# Patient Record
Sex: Female | Born: 1945 | Race: White | Hispanic: No | Smoking: Former smoker
Health system: Southern US, Community
[De-identification: ages and names within clinical notes are randomized; demographics above are authoritative.]

## PROBLEM LIST (undated history)

## (undated) DIAGNOSIS — G8929 Other chronic pain: Secondary | ICD-10-CM

## (undated) DIAGNOSIS — E039 Hypothyroidism, unspecified: Secondary | ICD-10-CM

## (undated) DIAGNOSIS — R1013 Epigastric pain: Secondary | ICD-10-CM

## (undated) DIAGNOSIS — K5790 Diverticulosis of intestine, part unspecified, without perforation or abscess without bleeding: Secondary | ICD-10-CM

## (undated) DIAGNOSIS — M545 Low back pain, unspecified: Secondary | ICD-10-CM

## (undated) DIAGNOSIS — G4700 Insomnia, unspecified: Secondary | ICD-10-CM

## (undated) DIAGNOSIS — D126 Benign neoplasm of colon, unspecified: Secondary | ICD-10-CM

## (undated) DIAGNOSIS — K219 Gastro-esophageal reflux disease without esophagitis: Secondary | ICD-10-CM

## (undated) DIAGNOSIS — K449 Diaphragmatic hernia without obstruction or gangrene: Secondary | ICD-10-CM

## (undated) DIAGNOSIS — G629 Polyneuropathy, unspecified: Secondary | ICD-10-CM

## (undated) DIAGNOSIS — K269 Duodenal ulcer, unspecified as acute or chronic, without hemorrhage or perforation: Secondary | ICD-10-CM

## (undated) HISTORY — DX: Hypothyroidism, unspecified: E03.9

## (undated) HISTORY — DX: Insomnia, unspecified: G47.00

## (undated) HISTORY — DX: Polyneuropathy, unspecified: G62.9

## (undated) HISTORY — DX: Low back pain: M54.5

## (undated) HISTORY — PX: ABDOMINAL HYSTERECTOMY: SHX81

## (undated) HISTORY — PX: CHOLECYSTECTOMY: SHX55

## (undated) HISTORY — PX: BACK SURGERY: SHX140

## (undated) HISTORY — DX: Benign neoplasm of colon, unspecified: D12.6

## (undated) HISTORY — DX: Other chronic pain: G89.29

## (undated) HISTORY — PX: DILATION AND CURETTAGE OF UTERUS: SHX78

## (undated) HISTORY — DX: Duodenal ulcer, unspecified as acute or chronic, without hemorrhage or perforation: K26.9

## (undated) HISTORY — DX: Gastro-esophageal reflux disease without esophagitis: K21.9

## (undated) HISTORY — DX: Diverticulosis of intestine, part unspecified, without perforation or abscess without bleeding: K57.90

## (undated) HISTORY — DX: Diaphragmatic hernia without obstruction or gangrene: K44.9

## (undated) HISTORY — PX: BLEPHAROPLASTY: SUR158

## (undated) HISTORY — DX: Low back pain, unspecified: M54.50

## (undated) HISTORY — DX: Epigastric pain: R10.13

---

## 1998-08-18 ENCOUNTER — Ambulatory Visit (HOSPITAL_COMMUNITY): Admission: RE | Admit: 1998-08-18 | Discharge: 1998-08-18 | Payer: Self-pay | Admitting: Gastroenterology

## 2000-01-01 ENCOUNTER — Other Ambulatory Visit: Admission: RE | Admit: 2000-01-01 | Discharge: 2000-01-01 | Payer: Self-pay | Admitting: Gynecology

## 2000-10-31 ENCOUNTER — Other Ambulatory Visit: Admission: RE | Admit: 2000-10-31 | Discharge: 2000-10-31 | Payer: Self-pay | Admitting: Gynecology

## 2001-11-06 ENCOUNTER — Other Ambulatory Visit: Admission: RE | Admit: 2001-11-06 | Discharge: 2001-11-06 | Payer: Self-pay | Admitting: Gynecology

## 2001-11-10 ENCOUNTER — Encounter: Payer: Self-pay | Admitting: Orthopaedic Surgery

## 2001-11-10 ENCOUNTER — Ambulatory Visit (HOSPITAL_COMMUNITY): Admission: RE | Admit: 2001-11-10 | Discharge: 2001-11-10 | Payer: Self-pay | Admitting: Urology

## 2001-11-13 ENCOUNTER — Encounter: Payer: Self-pay | Admitting: Orthopaedic Surgery

## 2001-11-13 ENCOUNTER — Encounter: Admission: RE | Admit: 2001-11-13 | Discharge: 2001-11-13 | Payer: Self-pay | Admitting: Orthopaedic Surgery

## 2002-12-01 ENCOUNTER — Encounter: Admission: RE | Admit: 2002-12-01 | Discharge: 2002-12-01 | Payer: Self-pay | Admitting: Family Medicine

## 2002-12-01 ENCOUNTER — Encounter: Payer: Self-pay | Admitting: Family Medicine

## 2002-12-02 ENCOUNTER — Encounter (HOSPITAL_BASED_OUTPATIENT_CLINIC_OR_DEPARTMENT_OTHER): Payer: Self-pay | Admitting: General Surgery

## 2002-12-03 ENCOUNTER — Encounter (HOSPITAL_BASED_OUTPATIENT_CLINIC_OR_DEPARTMENT_OTHER): Payer: Self-pay | Admitting: General Surgery

## 2002-12-03 ENCOUNTER — Encounter (INDEPENDENT_AMBULATORY_CARE_PROVIDER_SITE_OTHER): Payer: Self-pay | Admitting: *Deleted

## 2002-12-03 ENCOUNTER — Ambulatory Visit (HOSPITAL_COMMUNITY): Admission: RE | Admit: 2002-12-03 | Discharge: 2002-12-05 | Payer: Self-pay | Admitting: General Surgery

## 2002-12-04 ENCOUNTER — Encounter (HOSPITAL_BASED_OUTPATIENT_CLINIC_OR_DEPARTMENT_OTHER): Payer: Self-pay | Admitting: General Surgery

## 2002-12-09 ENCOUNTER — Emergency Department (HOSPITAL_COMMUNITY): Admission: EM | Admit: 2002-12-09 | Discharge: 2002-12-09 | Payer: Self-pay | Admitting: Emergency Medicine

## 2002-12-09 ENCOUNTER — Encounter (HOSPITAL_BASED_OUTPATIENT_CLINIC_OR_DEPARTMENT_OTHER): Payer: Self-pay | Admitting: General Surgery

## 2005-03-17 ENCOUNTER — Emergency Department (HOSPITAL_COMMUNITY): Admission: EM | Admit: 2005-03-17 | Discharge: 2005-03-17 | Payer: Self-pay | Admitting: Emergency Medicine

## 2005-10-30 ENCOUNTER — Other Ambulatory Visit: Admission: RE | Admit: 2005-10-30 | Discharge: 2005-10-30 | Payer: Self-pay | Admitting: Gynecology

## 2006-05-14 HISTORY — PX: EYE SURGERY: SHX253

## 2007-01-22 ENCOUNTER — Other Ambulatory Visit: Admission: RE | Admit: 2007-01-22 | Discharge: 2007-01-22 | Payer: Self-pay | Admitting: Gynecology

## 2007-01-22 ENCOUNTER — Ambulatory Visit: Payer: Self-pay | Admitting: Internal Medicine

## 2007-02-06 ENCOUNTER — Encounter: Payer: Self-pay | Admitting: Internal Medicine

## 2007-02-06 ENCOUNTER — Ambulatory Visit: Payer: Self-pay | Admitting: Internal Medicine

## 2007-04-24 ENCOUNTER — Emergency Department (HOSPITAL_COMMUNITY): Admission: EM | Admit: 2007-04-24 | Discharge: 2007-04-25 | Payer: Self-pay | Admitting: Emergency Medicine

## 2007-05-01 ENCOUNTER — Ambulatory Visit: Payer: Self-pay | Admitting: Internal Medicine

## 2007-06-12 ENCOUNTER — Ambulatory Visit: Payer: Self-pay | Admitting: Internal Medicine

## 2008-02-28 ENCOUNTER — Encounter: Payer: Self-pay | Admitting: Family Medicine

## 2008-05-17 ENCOUNTER — Encounter: Payer: Self-pay | Admitting: Internal Medicine

## 2008-07-20 ENCOUNTER — Ambulatory Visit: Payer: Self-pay | Admitting: Family Medicine

## 2008-07-20 DIAGNOSIS — E039 Hypothyroidism, unspecified: Secondary | ICD-10-CM

## 2008-07-20 HISTORY — DX: Hypothyroidism, unspecified: E03.9

## 2008-07-22 ENCOUNTER — Encounter: Payer: Self-pay | Admitting: Family Medicine

## 2008-12-21 ENCOUNTER — Ambulatory Visit: Payer: Self-pay | Admitting: Family Medicine

## 2008-12-21 DIAGNOSIS — H60399 Other infective otitis externa, unspecified ear: Secondary | ICD-10-CM

## 2009-05-19 ENCOUNTER — Ambulatory Visit: Payer: Self-pay | Admitting: Family Medicine

## 2009-05-19 DIAGNOSIS — J029 Acute pharyngitis, unspecified: Secondary | ICD-10-CM | POA: Insufficient documentation

## 2009-05-22 ENCOUNTER — Emergency Department (HOSPITAL_BASED_OUTPATIENT_CLINIC_OR_DEPARTMENT_OTHER): Admission: EM | Admit: 2009-05-22 | Discharge: 2009-05-22 | Payer: Self-pay | Admitting: Emergency Medicine

## 2009-05-22 ENCOUNTER — Ambulatory Visit: Payer: Self-pay | Admitting: Diagnostic Radiology

## 2009-05-27 ENCOUNTER — Telehealth: Payer: Self-pay | Admitting: Family Medicine

## 2009-07-19 ENCOUNTER — Encounter: Payer: Self-pay | Admitting: Internal Medicine

## 2009-07-19 ENCOUNTER — Ambulatory Visit: Payer: Self-pay | Admitting: Gynecology

## 2009-07-19 ENCOUNTER — Other Ambulatory Visit: Admission: RE | Admit: 2009-07-19 | Discharge: 2009-07-19 | Payer: Self-pay | Admitting: Gynecology

## 2009-07-26 ENCOUNTER — Ambulatory Visit: Payer: Self-pay | Admitting: Gynecology

## 2009-07-26 ENCOUNTER — Encounter: Payer: Self-pay | Admitting: Family Medicine

## 2009-07-28 ENCOUNTER — Telehealth: Payer: Self-pay | Admitting: Family Medicine

## 2009-08-16 ENCOUNTER — Ambulatory Visit: Payer: Self-pay | Admitting: Vascular Surgery

## 2009-08-16 ENCOUNTER — Ambulatory Visit (HOSPITAL_COMMUNITY)
Admission: RE | Admit: 2009-08-16 | Discharge: 2009-08-16 | Payer: Self-pay | Admitting: Physical Medicine and Rehabilitation

## 2009-08-16 ENCOUNTER — Encounter (INDEPENDENT_AMBULATORY_CARE_PROVIDER_SITE_OTHER): Payer: Self-pay | Admitting: Physical Medicine and Rehabilitation

## 2009-08-19 ENCOUNTER — Telehealth: Payer: Self-pay | Admitting: Internal Medicine

## 2009-08-19 ENCOUNTER — Encounter: Payer: Self-pay | Admitting: Internal Medicine

## 2009-08-22 ENCOUNTER — Ambulatory Visit: Payer: Self-pay | Admitting: Family Medicine

## 2009-08-22 DIAGNOSIS — R1013 Epigastric pain: Secondary | ICD-10-CM | POA: Insufficient documentation

## 2009-08-22 HISTORY — DX: Epigastric pain: R10.13

## 2009-08-23 LAB — CONVERTED CEMR LAB
ALT: 24 units/L (ref 0–35)
AST: 23 units/L (ref 0–37)
Alkaline Phosphatase: 94 units/L (ref 39–117)
Eosinophils Relative: 2.7 % (ref 0.0–5.0)
HCT: 38.9 % (ref 36.0–46.0)
Hemoglobin: 13.5 g/dL (ref 12.0–15.0)
Lipase: 14 units/L (ref 11.0–59.0)
Lymphocytes Relative: 34.7 % (ref 12.0–46.0)
Lymphs Abs: 2.3 10*3/uL (ref 0.7–4.0)
Monocytes Relative: 6.5 % (ref 3.0–12.0)
Platelets: 237 10*3/uL (ref 150.0–400.0)
Total Bilirubin: 0.4 mg/dL (ref 0.3–1.2)
WBC: 6.6 10*3/uL (ref 4.5–10.5)

## 2009-08-26 ENCOUNTER — Ambulatory Visit (HOSPITAL_COMMUNITY): Admission: RE | Admit: 2009-08-26 | Discharge: 2009-08-26 | Payer: Self-pay | Admitting: Family Medicine

## 2009-08-29 ENCOUNTER — Telehealth: Payer: Self-pay | Admitting: Family Medicine

## 2009-08-31 ENCOUNTER — Ambulatory Visit: Payer: Self-pay | Admitting: Gynecology

## 2009-09-20 ENCOUNTER — Ambulatory Visit: Payer: Self-pay | Admitting: Gynecology

## 2009-10-24 ENCOUNTER — Ambulatory Visit: Payer: Self-pay | Admitting: Family Medicine

## 2009-10-31 ENCOUNTER — Ambulatory Visit: Payer: Self-pay | Admitting: Family Medicine

## 2010-02-16 ENCOUNTER — Encounter: Payer: Self-pay | Admitting: Family Medicine

## 2010-05-29 ENCOUNTER — Telehealth: Payer: Self-pay | Admitting: Family Medicine

## 2010-06-13 NOTE — Procedures (Signed)
Summary: colonoscopy   Colonoscopy  Procedure date:  02/06/2007  Findings:      Location:  [facility].     Patient Name: Cassidy Norton, Cassidy Norton MRN:  Procedure Procedures: Colonoscopy CPT: 69629.    with polypectomy. CPT: A3573898.  Personnel: Endoscopist: Wilhemina Bonito. Marina Goodell, MD.  Referred By: Rema Fendt, NP.  Exam Location: Exam performed in Outpatient Clinic. Outpatient  Patient Consent: Procedure, Alternatives, Risks and Benefits discussed, consent obtained, from patient. Consent was obtained by the RN.  Indications  Average Risk Screening Routine.  History  Current Medications: Patient is not currently taking Coumadin.  Pre-Exam Physical: Performed Feb 06, 2007. Cardio-pulmonary exam, Rectal exam, HEENT exam , Abdominal exam, Mental status exam WNL.  Comments: Pt. history reviewed/updated, physical exam performed prior to initiation of sedation?yes Exam Exam: Extent of exam reached: Cecum, extent intended: Cecum.  The cecum was identified by appendiceal orifice and IC valve. Patient position: on left side. Time to Cecum: 00:07:44. Time for Withdrawl: 00:10:40. Colon retroflexion performed. Images taken. ASA Classification: II. Tolerance: good.  Monitoring: Pulse and BP monitoring, Oximetry used. Supplemental O2 given.  Colon Prep Used osmo prep for colon prep. Prep results: excellent.  Sedation Meds: Patient assessed and found to be appropriate for moderate (conscious) sedation. Fentanyl 75 mcg. given IV. Versed 10 mg. given IV.  Findings NORMAL EXAM: Ascending Colon to Rectum. Comments: melanosis coli.  - DIVERTICULOSIS: Ascending Colon to Sigmoid Colon. ICD9: Diverticulosis, Colon: 562.10. Comments: marked changes.  POLYP: Sigmoid Colon, Maximum size: 5 mm. Distance from Anus 28 cm. Procedure:  snare without cautery, removed, retrieved, Polyp sent to pathology. ICD9: Colon Polyps: 211.3.   Assessment  Diagnoses: 562.10: Diverticulosis,  Colon.  211.3: Colon Polyps.  455.0: Hemorrhoids, Internal.   Comments: Melanosis Coli Events  Unplanned Interventions: No intervention was required.  Unplanned Events: There were no complications. Plans Disposition: After procedure patient sent to recovery. After recovery patient sent home.  Scheduling/Referral: Colonoscopy, to Wilhemina Bonito. Marina Goodell, MD, in 5 years if polyp adenomatous; otherwise 10 years,

## 2010-06-13 NOTE — Assessment & Plan Note (Signed)
Summary: NAUSEA, EPIGASTRIC PAIN? // RS   Vital Signs:  Patient profile:   65 year old female Weight:      149 pounds Temp:     98.8 degrees F oral BP sitting:   130 / 74  (left arm) Cuff size:   regular  Vitals Entered By: Sid Falcon LPN (August 22, 2009 3:00 PM) CC: epigastric problems several weeks   History of Present Illness: Patient seen with couple week history of midepigastric pain that is somewhat intermittent. Pain is diffuse across upper abdomen. Frequent awakes from sleeping at night. Achy pain. Occasional sensation of tightness. Denies any chest pain or dyspnea. No exertional quality-walks daily without difficulty. Increased symptoms of burping recently. Bloated feeling. History cholecystectomy 2004.  Symptoms are somewhat intermittent. Had a couple episodes of nausea and vomiting. Pepto-Bismol seemed to help somewhat. Denies any weight loss. No history of peptic ulcer disease. Takes omeprazole 20 mg b.i.d. Also takes meloxicam.  nonsmoker.  Allergies: 1)  Aspirin (Aspirin)  Past History:  Past Medical History: Last updated: 07/20/2008 dyspepsia chronic low back pain  Risk Factors: Smoking Status: never (07/20/2008)  Past Surgical History: Cholecystectomy 2004 Hysterectomy blepharoplasty  Review of Systems  The patient denies anorexia, fever, weight loss, chest pain, syncope, dyspnea on exertion, peripheral edema, headaches, hemoptysis, melena, and hematochezia.    Physical Exam  General:  Well-developed,well-nourished,in no acute distress; alert,appropriate and cooperative throughout examination Head:  Normocephalic and atraumatic without obvious abnormalities. No apparent alopecia or balding. Mouth:  Oral mucosa and oropharynx without lesions or exudates.  Teeth in good repair. Neck:  No deformities, masses, or tenderness noted. Lungs:  Normal respiratory effort, chest expands symmetrically. Lungs are clear to auscultation, no crackles or  wheezes. Heart:  Normal rate and regular rhythm. S1 and S2 normal without gallop, murmur, click, rub or other extra sounds. Abdomen:  soft, non-tender, normal bowel sounds, no distention, no masses, no guarding, no rigidity, no rebound tenderness, no hepatomegaly, and no splenomegaly.   Extremities:  no edema. Psych:  normally interactive, good eye contact, not anxious appearing, and not depressed appearing.     Impression & Recommendations:  Problem # 1:  ABDOMINAL PAIN, EPIGASTRIC (ICD-789.06) Assessment New ?etiology.  Main risk factor for peptic disease is Mobic but takes omeprazole regularly.  Rec further evaluation since she has had progressive sxs on PPI. Schedule Upper GI. Orders: Venipuncture (04540) Radiology Referral (Radiology) TLB-CBC Platelet - w/Differential (85025-CBCD) TLB-Hepatic/Liver Function Pnl (80076-HEPATIC) TLB-Lipase (83690-LIPASE)  Complete Medication List: 1)  Levoxyl 88 Mcg Tabs (Levothyroxine sodium) .... One by mouth daily 2)  Estradiol 0.5 Mg Tabs (Estradiol) .... Once daily 3)  Omeprazole 20 Mg Tbec (Omeprazole) .... Two times a day 4)  Meloxicam 15 Mg Tabs (Meloxicam) .... Once daily 5)  Metamucil 30.9 % Powd (Psyllium) .... One tsp daily at bedtime  Patient Instructions: 1)  elevated head of bed 6-8 inches 2)  Consider supplement with Zantac or Pepcid to your omeprazole 3)  Leave off meloxicam as much as possible

## 2010-06-13 NOTE — Assessment & Plan Note (Signed)
Summary: cough/cold symptoms getting worse/cjr   Vital Signs:  Patient profile:   65 year old female Temp:     98.6 degrees F oral BP sitting:   110 / 70  (left arm) Cuff size:   regular  Vitals Entered By: Sid Falcon LPN (October 31, 2009 2:40 PM) CC: ongoing cough, worse at night   History of Present Illness: Patient seen with severe persistent cough. Seen last week and felt to have viral illness. Has intermittent headache which she thinks is from excessive coughing. Cough remains mostly dry. Denies any pleuritic pain, fever, chills, or hemoptysis. No dyspnea. No relief with Hycodan cough syrup. Patient is nonsmoker.  Allergies: 1)  Aspirin (Aspirin)  Past History:  Past Medical History: Last updated: 10/24/2009 dyspepsia chronic low back pain  Review of Systems  The patient denies anorexia, fever, weight loss, peripheral edema, and hemoptysis.    Physical Exam  General:  patient coughing frequent during exam but in no respiratory distress Head:  Normocephalic and atraumatic without obvious abnormalities. No apparent alopecia or balding. Ears:  External ear exam shows no significant lesions or deformities.  Otoscopic examination reveals clear canals, tympanic membranes are intact bilaterally without bulging, retraction, inflammation or discharge. Hearing is grossly normal bilaterally. Nose:  External nasal examination shows no deformity or inflammation. Nasal mucosa are pink and moist without lesions or exudates. Mouth:  Oral mucosa and oropharynx without lesions or exudates.  Teeth in good repair. Neck:  No deformities, masses, or tenderness noted. Lungs:  Normal respiratory effort, chest expands symmetrically. Lungs are clear to auscultation, no crackles or wheezes. Heart:  Normal rate and regular rhythm. S1 and S2 normal without gallop, murmur, click, rub or other extra sounds.   Impression & Recommendations:  Problem # 1:  COUGH (ICD-786.2) Assessment  Deteriorated  this may be all viral but her symptoms have progressed. Add Zithromax for atypical coverage and prescription for Tussionex  Orders: Prescription Created Electronically [phone])  Complete Medication List: 1)  Levoxyl 88 Mcg Tabs (Levothyroxine sodium) .... One by mouth daily 2)  Estradiol 0.5 Mg Tabs (Estradiol) .... Once daily 3)  Omeprazole 20 Mg Tbec (Omeprazole) .... Two times a day 4)  Meloxicam 15 Mg Tabs (Meloxicam) .... Once daily 5)  Metamucil 30.9 % Powd (Psyllium) .... One tsp daily at bedtime 6)  Tussionex Pennkinetic Er 8-10 Mg/24ml Lqcr (Chlorpheniramine-hydrocodone) .... One tsp by mouth q 12 hours as needed cough 7)  Azithromycin 250 Mg Tabs (Azithromycin) .... 2 by mouth today then one by mouth once daily for 4 days  Patient Instructions: 1)  call or be in touch in one to 2 weeks if symptoms not resolving Prescriptions: AZITHROMYCIN 250 MG TABS (AZITHROMYCIN) 2 by mouth today then one by mouth once daily for 4 days  #6 x 0   Entered and Authorized by:   Evelena Peat MD   Signed by:   Evelena Peat MD on 10/31/2009   Method used:   Print then Give to Patient   RxID:   [id number] TUSSIONEX PENNKINETIC ER 8-10 MG/5ML LQCR (CHLORPHENIRAMINE-HYDROCODONE) one tsp by mouth q 12 hours as needed cough  #90 ml x 0   Entered and Authorized by:   Evelena Peat MD   Signed by:   Evelena Peat MD on 10/31/2009   Method used:   Print then Give to Patient   RxID:   [id number]

## 2010-06-13 NOTE — Letter (Signed)
Summary: [facility]  [facility]   Imported By: Maryln Gottron 02/23/2010 14:04:56  _____________________________________________________________________  External Attachment:    Type:   Image     Comment:   External Document

## 2010-06-13 NOTE — Progress Notes (Signed)
Summary: lab results  Phone Note Call from Patient   Caller: Patient Call For: Evelena Peat MD Summary of Call: [phone] Pt is calling for xray results. Initial call taken by: Lynann Beaver CMA,  August 29, 2009 10:00 AM  Follow-up for Phone Call        reviewed results with pt.  hiatal hernia and mild reflux but no ulcers, mass, or evidence for delayed gastric emptying.  Pt has supplemented with Maalox over weekend and feels somewhat better.  No chest pain and walking without difficulty. Follow-up by: Evelena Peat MD,  August 29, 2009 5:30 PM

## 2010-06-13 NOTE — Progress Notes (Signed)
Summary: trig elevated 177  Phone Note Call from Patient Call back at Home Phone [phone]   Caller: vm Wed Call For: nancy Reason for Call: Talk to Nurse Summary of Call: Had labs Gyn - Triglyceride elevated to 177 & to fu with Dr. Leonard Schwartz.  What to do to help lower it before she has blood work again? Initial call taken by: Rudy Jew, RN,  July 28, 2009 8:38 AM  Follow-up for Phone Call        Reduction of sugars and starches is most important.  Omega 3 supplement such as fish oil will also help to lower.  Pt needs f/u to reassess thyroid. Follow-up by: Evelena Peat MD,  July 28, 2009 11:39 AM  Additional Follow-up for Phone Call Additional follow up Details #1::        Dr. Lily Peer also got thyroid labs & increased thyroid med & she is to followup with him about it.  Will follow Dr. Senaida Lange advice & get back to him for trig recheck.   Additional Follow-up by: Rudy Jew, RN,  July 28, 2009 1:34 PM

## 2010-06-13 NOTE — Procedures (Signed)
Summary: colonoscopy report   Colonoscopy  Procedure date:  02/06/2007  Findings:      Location:  [facility].    Colonoscopy  Procedure date:  02/06/2007  Findings:      Location:  [facility].    This report was created from the original endoscopy report, which was reviewed and signed by the above listed endoscopist.

## 2010-06-13 NOTE — Assessment & Plan Note (Signed)
Summary: HEADACHE, ACHING ALL OVER, COUGH//SLM   Vital Signs:  Patient profile:   65 year old female Temp:     98.8 degrees F oral BP sitting:   110 / 70  (left arm) Cuff size:   regular  Vitals Entered By: Sid Falcon LPN (October 24, 2009 9:36 AM) CC: Headache, sore throat, body aches, productive cough   History of Present Illness: Onset one week ago of sore throat, nasal congestion, body aches, and cough. Cough mostly dry.  OTC meds without relief.  ?low grade fever initially. Chills off and on.  Rapid strep neg.  Husband with similar symptoms.  Preventive Screening-Counseling & Management  Alcohol-Tobacco     Smoking Status: quit  Allergies: 1)  Aspirin (Aspirin)  Past History:  Past Surgical History: Last updated: 08/22/2009 Cholecystectomy 2004 Hysterectomy blepharoplasty  Past Medical History: dyspepsia chronic low back pain PMH reviewed for relevance  Family History: Father Died Gallbladder Cancer 76 Mother died 52 bladder cancer. Diabetes-mother, brother and sister.  Social History: Former Smoker Alcohol use-no Smoking Status:  quit  Review of Systems      See HPI  Physical Exam  General:  Well-developed,well-nourished,in no acute distress; alert,appropriate and cooperative throughout examination Ears:  External ear exam shows no significant lesions or deformities.  Otoscopic examination reveals clear canals, tympanic membranes are intact bilaterally without bulging, retraction, inflammation or discharge. Hearing is grossly normal bilaterally. Nose:  clear mucus Mouth:  Oral mucosa and oropharynx without lesions or exudates.  Teeth in good repair. Neck:  No deformities, masses, or tenderness noted. Lungs:  Normal respiratory effort, chest expands symmetrically. Lungs are clear to auscultation, no crackles or wheezes. Heart:  Normal rate and regular rhythm. S1 and S2 normal without gallop, murmur, click, rub or other extra sounds. Skin:  no rashes.    Cervical Nodes:  No lymphadenopathy noted   Impression & Recommendations:  Problem # 1:  VIRAL URI (ICD-465.9)  Her updated medication list for this problem includes:    Meloxicam 15 Mg Tabs (Meloxicam) ..... Once daily    Hydrocodone-homatropine 5-1.5 Mg/61ml Syrp (Hydrocodone-homatropine) ..... One tsp by mouth  4 to 6 hours as needed for cough.  Orders: Prescription Created Electronically [phone])  Complete Medication List: 1)  Levoxyl 88 Mcg Tabs (Levothyroxine sodium) .... One by mouth daily 2)  Estradiol 0.5 Mg Tabs (Estradiol) .... Once daily 3)  Omeprazole 20 Mg Tbec (Omeprazole) .... Two times a day 4)  Meloxicam 15 Mg Tabs (Meloxicam) .... Once daily 5)  Metamucil 30.9 % Powd (Psyllium) .... One tsp daily at bedtime 6)  Hydrocodone-homatropine 5-1.5 Mg/6ml Syrp (Hydrocodone-homatropine) .... One tsp by mouth  4 to 6 hours as needed for cough.  Other Orders: Rapid Strep (09811)  Patient Instructions: 1)  Get plenty of rest, drink lots of clear liquids, and use Tylenol or Ibuprofen for fever and comfort. Return in 7-10 days if you're not better: sooner if you'er feeling worse.  Prescriptions: HYDROCODONE-HOMATROPINE 5-1.5 MG/5ML SYRP (HYDROCODONE-HOMATROPINE) one tsp by mouth  4 to 6 hours as needed for cough.  #120 ml x 0   Entered and Authorized by:   Evelena Peat MD   Signed by:   Evelena Peat MD on 10/24/2009   Method used:   Print then Give to Patient   RxID:   [id number]   Preventive Care Screening  Bone Density:    Date:  08/12/2009    Results:  normal std dev  Pap Smear:    Date:  08/12/2009  Results:  normal

## 2010-06-13 NOTE — Medication Information (Signed)
Summary: Omeprazole/[facility]  Omeprazole/[facility]   Imported By: Sherian Rein 07/22/2009 09:38:15  _____________________________________________________________________  External Attachment:    Type:   Image     Comment:   External Document

## 2010-06-13 NOTE — Progress Notes (Signed)
Summary: Pt req script for Estradiol tabs .5mg  via [facility] order  Phone Note Call from Patient Call back at [facility] Phone [phone]   Caller: Patient Summary of Call: Pt req script for Estradiol tabs .5mg . Pt says that Dr. Renette Butters prescribed these original, but now has moved from [facility]. Pt uses [facility] mail order pharmacy.  Initial call taken by: Lucy Antigua,  May 27, 2009 1:18 PM    Prescriptions: ESTRADIOL 0.5 MG TABS (ESTRADIOL) once daily  #90 x 3   Entered by:   Sid Falcon LPN   Authorized by:   Evelena Peat MD   Signed by:   Sid Falcon LPN on 53/66/4403   Method used:   Electronically to        [facility]* (mail-order)             ,          Ph: [phone]       Fax: [phone]   RxID:   [id number]

## 2010-06-13 NOTE — Progress Notes (Signed)
Summary: sooner appt.   Phone Note Call from Patient Call back at [facility] Phone [phone] Call back at [phone] Cell   Caller: Patient Call For: Dr. Marina Goodell Reason for Call: Talk to Nurse Summary of Call: pt. having nausea x2wks...shortness of breath/pain under breast. would like to be seen sooner than first avail. Initial call taken by: Karna Christmas,  August 19, 2009 8:54 AM  Follow-up for Phone Call        Is on Omeprazole 20 mg. b.i.d.Over the last 2 weeks has had intermittent nausea  more prominent at night as it wakes her up.Last night she awoke with nausea and epigastric pain that made her feels like she couldn't catch her breath.Not symptomatic during the day. Follow-up by: Teryl Lucy RN,  August 19, 2009 9:08 AM  Additional Follow-up for Phone Call Additional follow up Details #1::        Not obvious to me why shw has such symptoms. Has not been seen in > 8yrs. If she can't wait to see me, then see if an extender can see her sooner (if there is an opening). She could also see her PCP since she also c/o SOB.  Also, please put old GI procedures and path in EMR Additional Follow-up by: Hilarie Fredrickson MD,  August 19, 2009 9:19 AM    Additional Follow-up for Phone Call Additional follow up Details #2::    Discussed Dr.Kiven Vangilder's recommendations with  pt. and she is is going to call for appt. with Dr.Burchette her PCP. Follow-up by: Teryl Lucy RN,  August 19, 2009 9:29 AM

## 2010-06-13 NOTE — Assessment & Plan Note (Signed)
Summary: st ok per doc/njr   Vital Signs:  Patient profile:   65 year old female Weight:      147 pounds O2 Sat:      96 % on Room air Temp:     97.7 degrees F oral Pulse rate:   81 / minute Pulse rhythm:   regular BP sitting:   122 / 74  (left arm) Cuff size:   regular  Vitals Entered By: Sid Falcon LPN (May 19, 2009 12:59 PM)  O2 Flow:  Room air CC: Body aches, sore throat   History of Present Illness: Acute visit. Onset yesterday of bodyaches and sore throat. No post nasal drip and no nasal congestive symptoms. Rare mild headache. No fever. Denies cough. History of frequent strep in the past. No ill exposures.  Allergies: 1)  Aspirin (Aspirin)  Past History:  Past Medical History: Last updated: 07/20/2008 dyspepsia chronic low back pain  Review of Systems      See HPI  Physical Exam  General:  Well-developed,well-nourished,in no acute distress; alert,appropriate and cooperative throughout examination Eyes:  No corneal or conjunctival inflammation noted. EOMI. Perrla. Funduscopic exam benign, without hemorrhages, exudates or papilledema. Vision grossly normal. Ears:  External ear exam shows no significant lesions or deformities.  Otoscopic examination reveals clear canals, tympanic membranes are intact bilaterally without bulging, retraction, inflammation or discharge. Hearing is grossly normal bilaterally. Nose:  External nasal examination shows no deformity or inflammation. Nasal mucosa are pink and moist without lesions or exudates. Mouth:  minimal posterior erythema. No exudate Neck:  supple no adenopathy noted Lungs:  clear auscultation throughout Heart:  regular rhythm and rate Skin:  no rash   Impression & Recommendations:  Problem # 1:  SORE THROAT (ICD-462)  group A strep unlikely but given lack of other respiratory infectious symptoms we'll check rapid strep.  Rapid Strep NEG The following medications were removed from the medication  list:strep    Doxycycline Hyclate 100 Mg Caps (Doxycycline hyclate) ..... One by mouth two times a day for 7 days Her updated medication list for this problem includes:    Meloxicam 15 Mg Tabs (Meloxicam) ..... Once daily  Orders: Rapid Strep (04540)  Complete Medication List: 1)  Levoxyl 88 Mcg Tabs (Levothyroxine sodium) .... One by mouth daily 2)  Estradiol 0.5 Mg Tabs (Estradiol) .... Once daily 3)  Omeprazole 20 Mg Tbec (Omeprazole) .... Two times a day 4)  Meloxicam 15 Mg Tabs (Meloxicam) .... Once daily  Patient Instructions: 1)  Tylenol and throat lozenges as needed for sore throat symptoms.

## 2010-06-15 NOTE — Progress Notes (Signed)
Summary: new rx to new phar, estradiol to [facility]  Phone Note Refill Request Message from:  Patient  Refills Requested: Medication #1:  ESTRADIOL 0.5 MG TABS once daily pt call [facility] with 3 refills call into [phone]  Initial call taken by: Heron Sabins,  May 29, 2010 10:32 AM    Prescriptions: ESTRADIOL 0.5 MG TABS (ESTRADIOL) once daily  #90 x 3   Entered by:   Sid Falcon LPN   Authorized by:   Evelena Peat MD   Signed by:   Sid Falcon LPN on 84/13/2440   Method used:   Electronically to        [facility]* (mail-order)       [address] [place], [place]  [address]       Ph: [phone]       Fax: [phone]   RxID:   [id number]

## 2010-06-29 IMAGING — CR DG UGI W/ HIGH DENSITY W/KUB
1 series · 1 of 1 positions shown · non-contrast
Comparison: None.

CLINICAL DATA: Epigastric pain.  Nausea and vomiting.

UPPER GI SERIES WITHOUT KUB
TECHNIQUE: Routine upper GI series was performed with high density
barium.
Fluoroscopy Time: 3.1 minutes

[view not recorded]
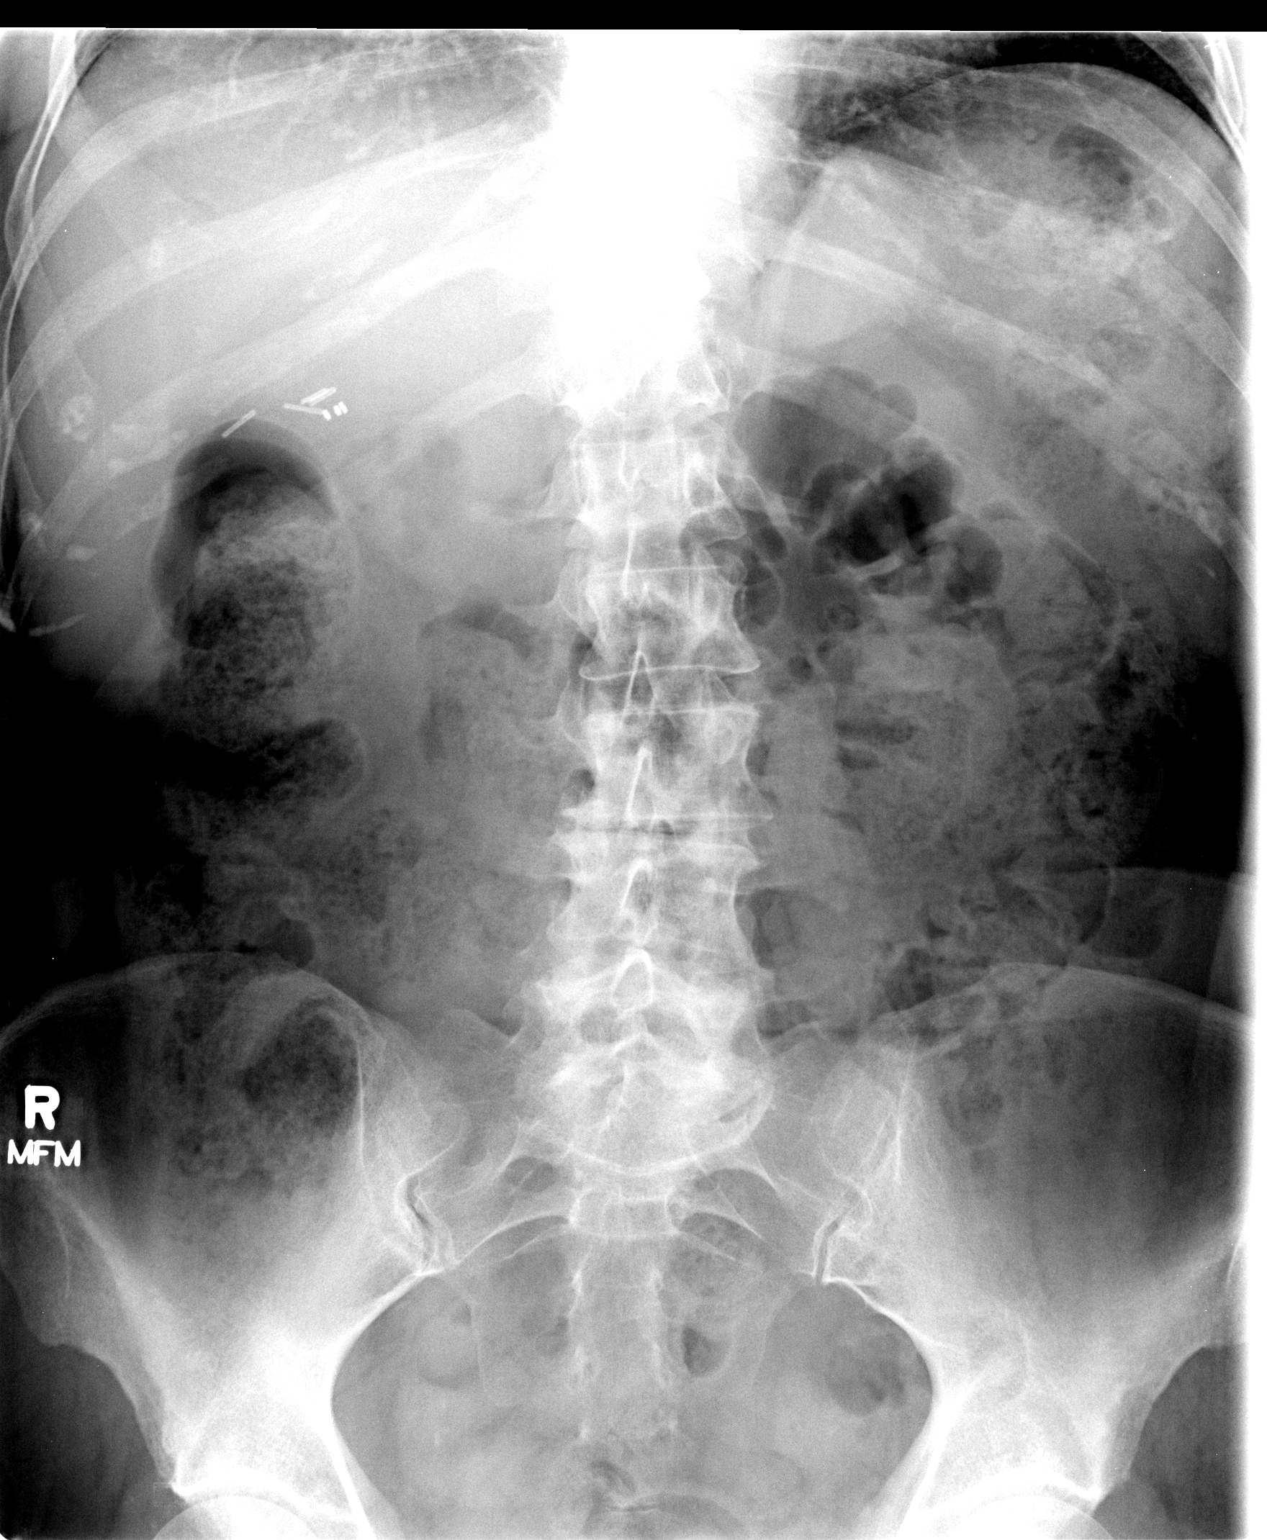

[1 of 1 positions shown; findings below may reference images not displayed]

FINDINGS: The esophageal mucosa and motility are satisfactory.
There is a mild to moderate hiatal hernia.  There is mild
gastroesophageal reflux.  No esophageal stricture or mass was
identified.  A barium tablet passed readily into the stomach
without delay.

The stomach and duodenal bulb appear normal.  There is no ulcer or
mass.  There are clips in the gallbladder fossa.
IMPRESSION: Hiatal hernia with gastroesophageal reflux.

## 2010-07-21 ENCOUNTER — Other Ambulatory Visit: Payer: Self-pay | Admitting: Gynecology

## 2010-07-21 ENCOUNTER — Encounter (INDEPENDENT_AMBULATORY_CARE_PROVIDER_SITE_OTHER): Payer: Medicare Other | Admitting: Gynecology

## 2010-07-21 ENCOUNTER — Other Ambulatory Visit (HOSPITAL_COMMUNITY)
Admission: RE | Admit: 2010-07-21 | Discharge: 2010-07-21 | Disposition: A | Discharge status: Home / Self Care | Payer: Medicare Other | Source: Ambulatory Visit | Attending: Gynecology | Admitting: Gynecology

## 2010-07-21 DIAGNOSIS — Z1211 Encounter for screening for malignant neoplasm of colon: Secondary | ICD-10-CM

## 2010-07-21 DIAGNOSIS — Z124 Encounter for screening for malignant neoplasm of cervix: Secondary | ICD-10-CM

## 2010-07-21 DIAGNOSIS — N951 Menopausal and female climacteric states: Secondary | ICD-10-CM

## 2010-07-24 ENCOUNTER — Other Ambulatory Visit (INDEPENDENT_AMBULATORY_CARE_PROVIDER_SITE_OTHER): Payer: Medicare Other

## 2010-07-24 DIAGNOSIS — R635 Abnormal weight gain: Secondary | ICD-10-CM

## 2010-08-01 ENCOUNTER — Encounter: Payer: Self-pay | Admitting: Family Medicine

## 2010-08-09 ENCOUNTER — Encounter: Payer: Self-pay | Admitting: Family Medicine

## 2010-08-11 ENCOUNTER — Telehealth: Payer: Self-pay | Admitting: Internal Medicine

## 2010-08-11 ENCOUNTER — Encounter: Payer: Self-pay | Admitting: Family Medicine

## 2010-08-15 NOTE — Telephone Encounter (Signed)
Probably should be seen if it has been more than 2 years. Ok to refill in the interim

## 2010-08-15 NOTE — Telephone Encounter (Signed)
ok 

## 2010-08-15 NOTE — Telephone Encounter (Signed)
Dr. Marina Goodell- Is okay that I put her in a 1:30 pm slot (follow-up) on April 25th?  No new patient slots available until middle of May.  Patient has seen you but it has been over 3 years.

## 2010-08-15 NOTE — Telephone Encounter (Signed)
Called Kanya and she is putting appt. In.

## 2010-08-15 NOTE — Telephone Encounter (Signed)
Omeprazole 20 mg 1 twice daily   [facility]  [phone]  Opt. 2 Pt. Last seen 2008 for Colonoscopy and is due for another Colonoscopy next year.  Denies any complaints.  Can she have refills or do you prefer her coming in to see you for follow-up first?  Please advise Thanks.

## 2010-08-16 ENCOUNTER — Other Ambulatory Visit: Payer: Self-pay | Admitting: *Deleted

## 2010-08-16 MED ORDER — OMEPRAZOLE 20 MG PO CPDR: 20.0000 mg | DELAYED_RELEASE_CAPSULE | Freq: Every day | ORAL | Status: DC

## 2010-08-16 NOTE — Telephone Encounter (Signed)
Rx. Sent.  #30 0nly  Pt. Has an appt. On 4/25.

## 2010-08-28 ENCOUNTER — Ambulatory Visit (INDEPENDENT_AMBULATORY_CARE_PROVIDER_SITE_OTHER): Payer: Medicare Other | Admitting: Family Medicine

## 2010-08-28 ENCOUNTER — Encounter: Payer: Self-pay | Admitting: Family Medicine

## 2010-08-28 VITALS — BP 110/70 | Temp 98.6°F | Ht 62.75 in | Wt 149.0 lb

## 2010-08-28 DIAGNOSIS — M549 Dorsalgia, unspecified: Secondary | ICD-10-CM

## 2010-08-28 NOTE — Patient Instructions (Signed)
Follow up immediately for any weakness or loss of bladder or bowel control.

## 2010-08-28 NOTE — Progress Notes (Signed)
  Subjective:    [name], female    DOB: 1945-09-20, 65 y.o.   MRN: [id number]  HPI Patient seen with sensation of left lower extremity "giving way" on her the past couple weeks intermittently. Symptoms occurring almost daily. She initially complained of her knee giving way but sounds like this may be more weakness in the thigh region. She also has occasional sensation of tingling left leg and occasional throbbing sensation in her anterior thigh. No known injury. No knee effusion. No edema except for some mild edema of the left ankle.  She has a history of some intermittent chronic low back pain and has seen orthopedists in the past. Denies any stool or urine incontinence symptoms. Denies appetite or weight changes. No fever or chills.   Review of Systems  Constitutional: Negative for fever, chills, activity change and appetite change.  Respiratory: Negative for shortness of breath.   Cardiovascular: Negative for chest pain.  Gastrointestinal: Negative for abdominal pain.  Genitourinary: Negative for dysuria and hematuria.  Neurological: Positive for weakness. Negative for syncope.  Hematological: Negative for adenopathy. Does not bruise/bleed easily.       Objective:   Physical Exam  Constitutional: She is oriented to person, place, and time. She appears well-developed and well-nourished.  Cardiovascular: Normal rate, regular rhythm and normal heart sounds.   No murmur heard. Pulmonary/Chest: Effort normal and breath sounds normal. She has no wheezes. She has no rales.  Musculoskeletal: She exhibits no edema and no tenderness.       Left knee exam reveals full range of motion. No effusion. Ligament testing is normal. Trace edema left ankle otherwise no edema. Full range of motion left ankle. No bony tenderness.  Neurological: She is alert and oriented to person, place, and time. No cranial nerve deficit.       Patient has trace deep tendon reflexes in knee and ankle  bilaterally. She has full-strength with plantar flexion dorsiflexion bilaterally. Equal strength with knee extension bilaterally. Has some mild weakness left hip flexion compared with right normal symmetric function to touch  Psychiatric: She has a normal mood and affect.          Assessment & Plan:  Left lower extremity weakness which appears to be more proximal in patient with chronic back pain. She appears to have some mild weakness with left hip flexion. She has seen [facility] and recommend referral back there for further evaluation. May need MRI to further clarify

## 2010-09-06 ENCOUNTER — Encounter: Payer: Self-pay | Admitting: Internal Medicine

## 2010-09-06 ENCOUNTER — Ambulatory Visit (INDEPENDENT_AMBULATORY_CARE_PROVIDER_SITE_OTHER): Payer: Medicare Other | Admitting: Internal Medicine

## 2010-09-06 VITALS — BP 112/66 | HR 64 | Ht 62.0 in | Wt 148.0 lb

## 2010-09-06 DIAGNOSIS — K219 Gastro-esophageal reflux disease without esophagitis: Secondary | ICD-10-CM

## 2010-09-06 DIAGNOSIS — K5900 Constipation, unspecified: Secondary | ICD-10-CM

## 2010-09-06 DIAGNOSIS — Z86010 Personal history of colonic polyps: Secondary | ICD-10-CM

## 2010-09-06 MED ORDER — OMEPRAZOLE 20 MG PO CPDR: 20.0000 mg | DELAYED_RELEASE_CAPSULE | Freq: Two times a day (BID) | ORAL | Status: DC

## 2010-09-06 NOTE — Progress Notes (Signed)
HISTORY OF PRESENT ILLNESS:  Cassidy Norton is a 65 y.o. female with the below list of medical problems who presents today for routine followup. She is followed in this office for GERD, chronic constipation, and a history of adenomatous colon polyps. She was last evaluated in January 2009. For her constipation she takes fiber and MiraLax. This has worked nicely. Prior problems with hemorrhoids have resolved. For her GERD she is taken omeprazole 20 mg twice a day. Recently, and overtly change to once daily dosage with breakthrough symptoms. No dysphagia. No prior upper endoscopy. Most recent colonoscopy in October of 2008 revealing marked diverticulosis and diminutive colon polyp (adenomatous). She denies lower GI complaints. Her overall general health has been good.  REVIEW OF SYSTEMS:  All non-GI ROS negative except for insomnia and occasional night sweats.  Past Medical History  Diagnosis Date  . HYPOTHYROIDISM 07/20/2008  . Abdominal pain, epigastric 08/22/2009  . Chronic low back pain   . GERD (gastroesophageal reflux disease)   . Diverticulosis   . Hemorrhoids   . Adenomatous colon polyp     Past Surgical History  Procedure Date  . Cholecystectomy   . Abdominal hysterectomy   . Blepharoplasty     Social History KARALINE BURESH  reports that she quit smoking about 7 years ago. Her smoking use included Cigarettes. She has a 60 pack-year smoking history. She has never used smokeless tobacco. She reports that she does not drink alcohol or use illicit drugs.  family history includes Cancer in her father and mother; Diabetes in her brother, mother, and sister; and Heart disease in her brother, daughter, and father.  Allergies  Allergen Reactions  . Aspirin     REACTION: Hives       PHYSICAL EXAMINATION:  Vital signs: BP 112/66  Pulse 64  Ht 5\' 2"  (1.575 m)  Wt 148 lb (67.132 kg)  BMI 27.07 kg/m2 General: Well-developed, well-nourished, no acute distress HEENT: Sclerae are  anicteric, conjunctiva pink. Oral mucosa intact Lungs: Clear Heart: Regular Abdomen: soft, nontender, nondistended, no obvious ascites, no peritoneal signs, normal bowel sounds. No organomegaly. Extremities: No edema Psychiatric: alert and oriented x3. Cooperative     ASSESSMENT:  #1 GERD without alarm features. Generally requiring twice a day PPI for control #2. Chronic constipation. Improved with fiber and MiraLax #3. History of adenomatous colon polyps. Surveillance up to date. Due for followup around October 2013 #4. Pandiverticulosis. Asymptomatic   PLAN:  #1. Prescribe omeprazole 20 mg by mouth twice a day. One year of refills #2. Reflux precautions #3. Continue daily fiber supplementation and MiraLax when necessary for constipation #4. Keep surveillance colonoscopy anniversary date.

## 2010-09-06 NOTE — Patient Instructions (Signed)
Refill of Omeprazole 20 mg #180 x 4 RFS sent to [facility] mail order take 1 by mouth twice daily Follow-up at time of your Colonoscopy in 2013.

## 2010-09-07 ENCOUNTER — Encounter: Payer: Self-pay | Admitting: Internal Medicine

## 2010-09-26 NOTE — Assessment & Plan Note (Signed)
[facility]                         GASTROENTEROLOGY OFFICE NOTE   HAYDAN, WEDIG                      MRN:          [id number]  DATE:06/12/2007                            DOB:          11/21/1945    HISTORY:  This is a 65 year old female with multiple general medical  problems who was evaluated May 01, 2007 for epigastric pain,  bloating and pharyngeal burning.  See that dictation for details.  She  was felt to have reflux disease as well as chronic constipation with gas  and bloating.  She was continued on Prilosec 20 mg b.i.d. and asked to  discontinue Carafate.  Also advised with regards to reflux precautions,  finally placed on GlycoLax daily in addition to Metamucil.  She presents  to followup at this time.  She reports to me that since her last visit,  she is doing well.  Her symptoms have resolved.  Omeprazole has  controlled the epigastric pain and burning.  The Metamucil and MiraLax  have controlled the constipation.  She uses MiraLax about once a week.  She is quite pleased.  No interval problems or questions.  Current  medications are Estratest, Meloxicam, Levoxyl, Metamucil, Mucinex,  Prilosec, and MiraLax.   PHYSICAL EXAMINATION:  Physical examination today finds a well appearing  female in no acute distress.  Her blood pressure is 108/54, heart rate  68 and regular, weight is 147 pounds.  HEENT:  Sclerae are anicteric.  ABDOMEN:  Soft, without tenderness, mass or hernia.  Good bowel sounds  heard.   IMPRESSION:  1. Gastroesophageal reflux disease.  Symptoms well controlled on      Prilosec 20 mg b.i.d.  2. Chronic constipation with bloating.  Improved with fiber and      MiraLax as needed.  3. History of adenomatous colon polyps.  4. Pan diverticulosis.   RECOMMENDATIONS:  1. Continue Prilosec  2. Continue reflux precautions  3. Continue Metamucil and GlycoLax as needed.  4. Resume general medical care with  [facility]     Wilhemina Bonito. Marina Goodell, MD  Electronically Signed    JNP/MedQ  DD: 06/12/2007  DT: 06/12/2007  Job #: [id number]   cc:   [facility] [place]

## 2010-09-26 NOTE — Assessment & Plan Note (Signed)
[facility]                                 ON-CALL NOTE   Cassidy Norton, Cassidy Norton                      MRN:          [id number]  DATE:04/21/2007                            DOB:          [birth year]    This is a patient of Dr. Jonny Ruiz Perry's.  [name] called tonight, she  told our answering service that she had pain between her breast bone.  [name] does not have any left chest pain, she does not have any  shortness of breath.  For weeks she has been having indigestion and  gurgling of her stomach, she has an acid taste in her mouth, she says it  has been keeping her up at night.  She has been taking Tums with only  minor relief.  She has not tried any H2 blockers or PPI.  She does drink  2 cups of coffee in the morning but does not smoke cigarettes and does  not drink much alcohol at all.  She has no left-sided chest pains.  She  does have some burning in her substernal area.   I suspect [name] has symptomatic GERD.  I recommended that she get  some OTC Prilosec tonight, take 2 pills tonight and then 1 pill every  morning, starting tomorrow, 20-30 minutes prior to her breakfast meals,  that is the way the pill was designed to work most effectively.  She is  already scheduled to see Dr. Yancey Flemings in 2 week's time in the office  and she knows to keep that appointment.     Rachael Fee, MD  Electronically Signed    DPJ/MedQ  DD: 04/21/2007  DT: 04/22/2007  Job #: [id number]   cc:   Wilhemina Bonito. Marina Goodell, MD

## 2010-09-26 NOTE — Assessment & Plan Note (Signed)
[facility]                         GASTROENTEROLOGY OFFICE NOTE   Cassidy Norton, Cassidy Norton                      MRN:          [id number]  DATE:05/01/2007                            DOB:          [birth year]    GASTROENTEROLOGY CONSULTATION  The patient is self referred.   REASON FOR CONSULTATION:  Epigastric pain, bloating, and pharyngeal  burning.   HISTORY:  This is a pleasant 65 year old white female with a history of  hypothyroidism, degenerative arthritis and adenomatous colon polyps.  She presents herself to the office as a new patient for evaluation of  epigastric pain, bloating and pharyngeal burning.  The patient did  undergo screening colonoscopy February 06, 2007.  She was found to have  adenomatous colon polyp, severe pan diverticulosis, internal hemorrhoids  and melanosis coli.  She is chronically constipated, which is  problematic.  She reports recent problems with band-like upper abdominal  pain, particularly in the epigastric region.  She has also been  experiencing pyrosis with regurgitation, particularly at night.  In  addition, bloating and gas particularly when constipated. Finally, she  has noticed some red blood occasionally with defecation. She has had a  10 pound weight gain over the past six months.  She contacted one of my  partners on call, anticipating this office visit, who recommended  Prilosec.  She has been on that for about two weeks.  She also went to  the emergency room for similar symptoms and was prescribed Carafate.  During her emergency room evaluation, on April 24, 2007, multiple  laboratories, including amylase, lipase and liver function tests were  unremarkable.  Urinalysis was also unremarkable.  She is status post  cholecystectomy.  She denies dysphagia or melena.  Since she has been on  Prilosec and Carafate she thinks her symptoms have improved  significantly.   PAST MEDICAL HISTORY:  1.  Hypothyroidism.  2. Osteoarthritis.  3. Chronic back pain.  4. Adenomatous colon polyps.  5. Diverticulosis.  6. Status post cholecystectomy in 2004.  7. Status post hysterectomy in 1975.   ALLERGIES:  INTOLERANT to ASPIRIN which causes hives.   CURRENT MEDICATIONS:  1. Carafate two teaspoons q.i.d.  2. Prilosec OTC 20 mg b.i.d.  3. Estratest.  4. Meloxicam 50 mg daily.  5. Levoxyl 88 mcg daily.  6. Metamucil b.i.d.  7. Mucinex p.r.n.   FAMILY HISTORY:  No family history of gastrointestinal malignancy.  Mother with bladder cancer.  Mother and siblings with diabetes.  Father  with heart disease.   SOCIAL HISTORY:  Patient is married with one daughter.  She did have a  son who died in a car accident.  She lives with her spouse. 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 prior to the dissolving of that company.  She does not smoke  nor use alcohol.   REVIEW OF SYSTEMS:  Per diagnostic evaluation form.   PHYSICAL EXAMINATION:  GENERAL APPEARANCE:  Well-appearing female in no  acute distress.  VITAL SIGNS:  Blood pressure 120/68, heart rate 72, weight is 152.6  pounds. She is  5 feet, 4 inches in height.  HEENT:  Sclerae anicteric.  Conjunctivae are pink.  Oral mucosa is  intact.  NECK:  No adenopathy.  LUNGS:  Clear.  HEART:  Is regular.  ABDOMEN: Obese and soft without tenderness, mass or hernia.  Good bowel  sounds heard.  EXTREMITIES:  Are without edema.   IMPRESSION:  1. Gastroesophageal reflux disease.  I believe this explains all of      the patient's upper gastrointestinal symptoms.  No alarm features.  2. Chronic constipation with bloating and gas.  3. History of adenomatous colon polyps.  4. Pan diverticulosis.   RECOMMENDATIONS:  1. Continue Prilosec 20 mg b.i.d.  2. Discontinue Carafate.  3. Reflux precautions with attention to weight loss.  Reflux      precautions reviewed and literature provided.  4.  Glycolax 17 grams daily in 8 ounces of water for constipation.      Titrate to need.  5. Continue Metamucil.  6. Routine office followup in six weeks.  If symptoms do not respond      then consider upper endoscopy.     Wilhemina Bonito. Marina Goodell, MD  Electronically Signed   JNP/MedQ  DD: 05/01/2007  DT: 05/02/2007  Job #: [id number]   cc:   [facility] [place]

## 2010-09-29 NOTE — Op Note (Signed)
NAMEBELVIE, IRIBE                         ACCOUNT NO.:  [id number]   MEDICAL RECORD NO.:  [id number]                   PATIENT TYPE:  OIB   LOCATION:  2899                                 FACILITY:  [facility]   PHYSICIAN:  Leonie Man, M.D.                DATE OF BIRTH:  Jan 10, 1946   DATE OF PROCEDURE:  12/03/2002  DATE OF DISCHARGE:                                 OPERATIVE REPORT   PREOPERATIVE DIAGNOSES:  Chronic calculus, cholecystitis.   POSTOPERATIVE DIAGNOSES:  Chronic calculus, cholecystitis,  choledocholithiasis.   PROCEDURE:  Laparoscopic cholecystectomy, intraoperative cholangiogram.   SURGEON:  Leonie Man, M.D.   ASSISTANT:  Joanne Gavel, M.D.   ANESTHESIA:  General.   NOTE:  This patient is a 65 year old female who presents with right upper  quadrant abdominal pain with radiation to the back, associated nausea and  vomiting.  She had an abdominal ultrasound which shows multiple gallstones.  She did have some mild elevation of her liver function studies.  Total  bilirubin was normal.  She comes to the operating room following full  discussion of the details of the procedure, the potential benefits and risks  of the procedure.  All questions were answered and consent obtained.   PROCEDURE:  Following induction of satisfactory general anesthesia, the  patient was positioned supinely and the abdomen routinely prepped and draped  to be included in the sterile operative field.  Open laparoscopy created at  the umbilicus by making a curvilinear incision above the umbilicus  __________ to the linea alba opening the linea alba, inserting the Hasson  cannula into the peritoneal cavity.  The peritoneal cavity was insufflated  to 14 mmHg of pressure using CO2.  Visual exploration of the abdomen showed  the liver edge to be sharp.  The surface was with mild mottling but nothing  abnormal.  The anterior gastric wall and duodenal C-loop appeared to be  normal.  Multiple  adhesions to the gallbladder from the omentum.  In the  pelvis there were multiple adhesions.  Pelvic organs could not be  visualized.  Under direct vision, epigastric and lateral ports were placed.  The gallbladder was grasped and retracted cephalad as dissection was carried  out to remove the multiple adhesions from the gallbladder.  Dissection was  carried down to the region of the gallbladder ampulla, where the cystic  artery and cystic duct were both visualized.  The cystic artery was traced  up to its entrance to the gallbladder wall and it was then doubly clipped  and transected.  The cystic duct was traced up to the gallbladder/cystic  duct junction and down to the gallbladder/common bile duct junction.  Was  clipped proximally and opened.  I inserted a Reddick catheter into the  cystic duct after having passed it into the abdomen through a 14 gauge  Angiocath.  I injected a one-half strength solution of Hypaque dye into  the  biliary system.  The resulting cholangiogram showed a dilated biliary system  with a filling defect in the distal common bile duct.  The initial  cholangiogram showed no flow into the duodenum.  After 1 amp of Glucagon,  there was rapid flow into the duodenum but there was a persistent filling  defect.  The cholangiocatheter was then removed.  The cystic duct was doubly  clipped and transected and the gallbladder then dissected free from the  liver bed using electrocautery, maintaining hemostasis throughout the course  of the dissection.  At the end of the dissection, the right upper quadrant  was thoroughly irrigated.  All the areas of the dissection were checked for  hemostasis.  Additional bleeding points within the liver bed treated with  electrocautery.   The gallbladder was then retrieved through the umbilical port in the Endo-  Pouch and the trocars removed under direct vision.  Sponge, instrument, and  sharp counts verified.  Pneumoperitoneum allowed  to deflate.  The wounds  were then closed in layers as follows:  Umbilical wound closed with 0 Vicryl  and 4-0 Monocryl.  Epigastric and lateral flank wounds closed with 4-0  Monocryl sutures.  All wounds were then closed with Steri-Strips.  Sterile  dressings applied.  Anesthetic reversed.  Patient moved from the operating  room to the recovery room in stable condition.  She tolerated the procedure  well.                                               Leonie Man, M.D.    PB/MEDQ  D:  12/03/2002  T:  12/03/2002  Job:  [id number]

## 2010-09-29 NOTE — Op Note (Signed)
Cassidy Norton, Cassidy Norton                         ACCOUNT NO.:  [id number]   MEDICAL RECORD NO.:  [id number]                   PATIENT TYPE:  OIB   LOCATION:  5705                                 FACILITY:  [facility]   PHYSICIAN:  Wilhemina Bonito. Marina Goodell, M.D. [facility]             DATE OF BIRTH:  December 04, 1945   DATE OF PROCEDURE:  12/04/2002  DATE OF DISCHARGE:                                 OPERATIVE REPORT   PROCEDURE:  Endoscopic retrograde cholangiopancreatography with biliary  sphincterotomy and common bile duct stone extraction.   INDICATIONS:  Retained common bile duct stone post laparoscopic  cholecystectomy.   HISTORY:  This is a 65 year old female with a history of hypothyroidism who  underwent laparoscopic cholecystectomy yesterday with Dr. Lurene Shadow for  symptomatic cholelithiasis.  Intraoperative cholangiogram demonstrated a  small distal stone.  Overnight the patient did well with incisional  abdominal pain.  Her liver function tests this morning deteriorated with the  transaminases in the 200 range and bilirubin 4.8.  She is now for ERCP with  sphincterotomy and stone extraction.  The nature of the procedure as well as  its risks, benefits, and alternatives, were carefully reviewed with the  patient.  She understood and agreed to proceed.   PHYSICAL EXAMINATION:  GENERAL:  A well-appearing female in no acute  distress.  She is alert and oriented.  VITAL SIGNS:  Stable.  HEENT:  Sclerae are icteric.  CHEST:  Lungs are clear.  CARDIAC:  Heart is regular.  ABDOMEN:  Soft with peri-incisional tenderness.   PROCEDURE:  After informed consent was obtained, the patient was sedated  over the course of the procedure with 75 mg of Demerol and 7 mg of Versed  IV.  Glucagon 1.5 mg IV was given in divided doses to provide duodenal  relaxation.  In addition, ciprofloxacin 400 mg IV was given Preprocedure.  The Olympus therapeutic side-viewing endoscope was then passed blindly into  the esophagus.   The stomach and duodenal bulb were unremarkable.  Postbulbar  duodenum was remarkable for a large periampullary diverticulum.  The major  ampulla was positioned on the rim of the diverticulum and angled slightly  posteriorly, making its approach somewhat challenging.  The minor papilla  was not sought.   X-RAY FINDINGS:  1. Scout radiograph of the abdomen with the endoscope in position revealed     cholecystectomy clips, no other abnormalities.  2. Initial injection of contrast in view of the major papilla yielded a     partial pancreatogram.  3. The cannula was repositioned and complete cholangiogram was obtained.     The common bile duct was then deeply cannulated.  The duct was mildly but     diffusely dilated with mid-common bile duct diameter approximately 12-13     mm.  A small distal filling defect which was mobile and consistent with a     stone was noted.   THERAPY:  With a hydrophilic guidewire in the proximal biliary tree, a  biliary sphincterotomy was performed with cutting over the guidewire along  the bile duct axis.  Cutting was performed using the ERBE system.  Biliary  sphincterotomy size was deemed large.  The cutting catheter was then  exchanged for a 12 mm balloon.  With the balloon, the small stone was  extracted and photographed.  Post-extraction occlusion cholangiogram  demonstrated no residual filling defects with excellent drainage present.   IMPRESSION:  Choledocholithiasis post laparoscopic cholecystectomy, status  post endoscopic retrograde cholangiopancreatography with biliary  sphincterotomy and common duct stone extraction.   RECOMMENDATIONS:  1. Standard postprocedure observation.  2. Antibiotic coverage overnight.  3. If doing well in a.m., discharge anticipated.                                               Wilhemina Bonito. Marina Goodell, M.D. [facility]    JNP/MEDQ  D:  12/04/2002  T:  12/04/2002  Job:  [id number]   cc:   Leonie Man, M.D.  [address],  [address]  [place]  [place] [address]  Fax: [phone]

## 2010-10-08 ENCOUNTER — Other Ambulatory Visit: Payer: Self-pay | Admitting: Internal Medicine

## 2011-01-04 ENCOUNTER — Ambulatory Visit: Payer: 59 | Admitting: Family Medicine

## 2011-01-05 ENCOUNTER — Ambulatory Visit (INDEPENDENT_AMBULATORY_CARE_PROVIDER_SITE_OTHER): Payer: Medicare Other | Admitting: Family Medicine

## 2011-01-05 ENCOUNTER — Encounter: Payer: Self-pay | Admitting: Family Medicine

## 2011-01-05 DIAGNOSIS — R519 Headache, unspecified: Secondary | ICD-10-CM

## 2011-01-05 DIAGNOSIS — R110 Nausea: Secondary | ICD-10-CM

## 2011-01-05 DIAGNOSIS — E039 Hypothyroidism, unspecified: Secondary | ICD-10-CM

## 2011-01-05 DIAGNOSIS — R51 Headache: Secondary | ICD-10-CM

## 2011-01-05 LAB — SEDIMENTATION RATE: Sed Rate: 13 mm/hr (ref 0–22)

## 2011-01-05 LAB — TSH: TSH: 0.44 u[IU]/mL (ref 0.35–5.50)

## 2011-01-05 NOTE — Patient Instructions (Signed)
Follow up immediately for any fever, focal weakness, confusion, or any new neurologic symptoms.

## 2011-01-05 NOTE — Progress Notes (Signed)
  Subjective:    [name], female    DOB: [birth year], 65 y.o.   MRN: [id number]  HPI Patient seen for progressive headaches. Present for several weeks. No history of similar headaches in past. Location somewhat poorly localized. She has bifrontal headaches slightly worse on the left but occasional occipital involvement as well. Severity is moderate. Achy to sharp quality. Recently has developed symptoms of nausea over the past week or 2 which is a new symptom. She also feels somewhat off balance and sometimes feels like she is falling to the left with walking. No hand discoordination. Headaches are daily. Denies any fever, chills, sinus congestion, cough, appetite or weight changes. No exacerbating features. No alleviating factors but she's not really take anything.  She denies focal weakness, speech changes, vision changes, or any cognitive impairment. No syncope.  Chronic problems are hypothyroidism. She is unsure when TSH was checked last   Review of Systems  Constitutional: Negative for fever, chills, activity change and unexpected weight change.  HENT: Negative for trouble swallowing.   Respiratory: Negative for cough and shortness of breath.   Cardiovascular: Negative for chest pain.  Neurological: Positive for headaches. Negative for dizziness, seizures, syncope and weakness.  Psychiatric/Behavioral: Negative for confusion.       Objective:   Physical Exam  Constitutional: She is oriented to person, place, and time. She appears well-developed and well-nourished.  HENT:  Head: Normocephalic and atraumatic.       Cerumen left canal. Right is normal  Eyes: Pupils are equal, round, and reactive to light.  Neck: Normal range of motion. Neck supple. No thyromegaly present.  Cardiovascular: Normal rate, regular rhythm and normal heart sounds.   Pulmonary/Chest: Effort normal and breath sounds normal. No respiratory distress. She has no wheezes. She has no rales.    Musculoskeletal: She exhibits no edema.  Lymphadenopathy:    She has no cervical adenopathy.  Neurological: She is alert and oriented to person, place, and time. No cranial nerve deficit.  Psychiatric: She has a normal mood and affect. Her behavior is normal.          Assessment & Plan:  New onset headache in a 65 year old female. No specific concern for temporal arteritis such as localized pain, blurred vision, etc. She has somewhat concerning symptoms of nausea and progressive nature. Check TSH and sedimentation rate. CT head without contrast Hypothyroidism-check TSH.

## 2011-01-08 ENCOUNTER — Ambulatory Visit (INDEPENDENT_AMBULATORY_CARE_PROVIDER_SITE_OTHER)
Admission: RE | Admit: 2011-01-08 | Discharge: 2011-01-08 | Disposition: A | Discharge status: Home / Self Care | Payer: Medicare Other | Source: Ambulatory Visit | Attending: Family Medicine | Admitting: Family Medicine

## 2011-01-08 DIAGNOSIS — R51 Headache: Secondary | ICD-10-CM

## 2011-01-09 ENCOUNTER — Other Ambulatory Visit: Payer: Self-pay | Admitting: Family Medicine

## 2011-01-09 MED ORDER — CYCLOBENZAPRINE HCL 5 MG PO TABS: 5.0000 mg | ORAL_TABLET | Freq: Three times a day (TID) | ORAL | Status: DC | PRN

## 2011-01-09 NOTE — Progress Notes (Signed)
Quick Note:  Pt informed ______ 

## 2011-01-16 ENCOUNTER — Telehealth: Payer: Self-pay | Admitting: Family Medicine

## 2011-01-16 NOTE — Telephone Encounter (Signed)
Please call pt with lab results from 8/24. She still has not heard anything.

## 2011-01-16 NOTE — Telephone Encounter (Signed)
Pt informed

## 2011-01-16 NOTE — Telephone Encounter (Signed)
Labs OK

## 2011-01-23 ENCOUNTER — Ambulatory Visit (INDEPENDENT_AMBULATORY_CARE_PROVIDER_SITE_OTHER): Payer: Medicare Other | Admitting: Family Medicine

## 2011-01-23 ENCOUNTER — Encounter: Payer: Self-pay | Admitting: Family Medicine

## 2011-01-23 VITALS — BP 122/64 | Temp 98.1°F | Wt 146.0 lb

## 2011-01-23 DIAGNOSIS — M549 Dorsalgia, unspecified: Secondary | ICD-10-CM

## 2011-01-23 DIAGNOSIS — M546 Pain in thoracic spine: Secondary | ICD-10-CM

## 2011-01-23 DIAGNOSIS — R51 Headache: Secondary | ICD-10-CM

## 2011-01-23 MED ORDER — CYCLOBENZAPRINE HCL 5 MG PO TABS: 5.0000 mg | ORAL_TABLET | Freq: Every evening | ORAL | Status: AC | PRN

## 2011-01-23 NOTE — Progress Notes (Signed)
  Subjective:    [name], female    DOB: [birth year], 65 y.o.   MRN: [id number]  HPI This is in for followup of recent atypical headache. CT scan unremarkable. We started low-dose cyclobenzaprine 5 mg and headaches have resolved. She is also noticing a lot less neck and upper back discomfort. She does have some dry mouth and mild constipation but overall greatly improved. She is left off Ambien at this time. Still has some problems occasionally with consistent sleep through the night. No radiculopathy symptoms. No upper extremity weakness   Review of Systems  Constitutional: Negative for fever.  Respiratory: Negative for cough and shortness of breath.   Cardiovascular: Negative for chest pain.  Neurological: Negative for dizziness, syncope and headaches.  Hematological: Negative for adenopathy.       Objective:   Physical Exam  Constitutional: She is oriented to person, place, and time. She appears well-developed and well-nourished.  Eyes: Pupils are equal, round, and reactive to light.  Cardiovascular: Normal rate, regular rhythm and normal heart sounds.   Pulmonary/Chest: Effort normal and breath sounds normal. No respiratory distress. She has no wheezes. She has no rales.  Musculoskeletal: She exhibits no edema.  Neurological: She is alert and oriented to person, place, and time. No cranial nerve deficit.  Psychiatric: She has a normal mood and affect. Her behavior is normal.          Assessment & Plan:  Atypical headache improved on cyclobenzaprine. She also had some upper back pain which is improved as well. Continue Flexeril 5 mg each bedtime but she does wish to try reduce frequency. Discontinue Ambien. Discussed appropriate exercises. Followup promptly if headaches recur

## 2011-01-23 NOTE — Patient Instructions (Signed)
Try reducing frequency of cyclobenzaprine.

## 2011-02-05 ENCOUNTER — Telehealth: Payer: Self-pay | Admitting: *Deleted

## 2011-02-05 MED ORDER — ESTRADIOL 0.5 MG PO TABS: ORAL_TABLET | ORAL | Status: DC

## 2011-02-05 NOTE — Telephone Encounter (Signed)
(  Please see paper chart) Pharmacy faxed that estradiol 1mg  and 2 mg is on back order. Please advise

## 2011-02-05 NOTE — Telephone Encounter (Signed)
Estradiol 1mg  on back order, so pt was placed on estradiol 0.5mg  2 tabs by mouth daily.

## 2011-02-06 ENCOUNTER — Telehealth: Payer: Self-pay | Admitting: *Deleted

## 2011-02-06 NOTE — Telephone Encounter (Signed)
Pharmacy wants to know if okay for pt to have 90 days supply of her estradiol 0.5mg  2 tabs daily? This will be cheaper for patient. Original rx was written for estradiol 0.5mg  2 tabs daily # 60. Please advise if okay.

## 2011-02-07 ENCOUNTER — Other Ambulatory Visit: Payer: Self-pay | Admitting: *Deleted

## 2011-02-07 MED ORDER — ESTRADIOL 0.5 MG PO TABS: ORAL_TABLET | ORAL | Status: DC

## 2011-02-19 LAB — I-STAT 8, (EC8 V) (CONVERTED LAB)
Acid-base deficit: 2
Bicarbonate: 23.9
HCT: 44
Hemoglobin: 15
Operator id: 257131
Sodium: 140
TCO2: 25
pCO2, Ven: 44.9 — ABNORMAL LOW

## 2011-02-19 LAB — DIFFERENTIAL
Basophils Absolute: 0.1
Basophils Relative: 1
Lymphocytes Relative: 31
Monocytes Absolute: 0.7
Neutro Abs: 5
Neutrophils Relative %: 56

## 2011-02-19 LAB — URINALYSIS, ROUTINE W REFLEX MICROSCOPIC
Bilirubin Urine: NEGATIVE
Glucose, UA: NEGATIVE
Ketones, ur: NEGATIVE
Nitrite: NEGATIVE
Specific Gravity, Urine: 1.008
pH: 6.5

## 2011-02-19 LAB — URINE MICROSCOPIC-ADD ON

## 2011-02-19 LAB — LIPASE, BLOOD: Lipase: 39

## 2011-02-19 LAB — HEPATIC FUNCTION PANEL
Alkaline Phosphatase: 73
Bilirubin, Direct: 0.2
Indirect Bilirubin: 0.4
Total Bilirubin: 0.6

## 2011-02-19 LAB — CBC
Hemoglobin: 14.4
MCHC: 34.3
RBC: 4.46
RDW: 12.7

## 2011-02-19 LAB — POCT I-STAT CREATININE: Creatinine, Ser: 0.7

## 2011-03-19 ENCOUNTER — Telehealth: Payer: Self-pay

## 2011-03-19 NOTE — Telephone Encounter (Signed)
[facility] Triage Call Report Triage Record Num: [id number] Operator: Elita Boone Patient Name: Cassidy Norton Call Date & Time: 03/17/2011 8:20:33AM Patient Phone: [phone] PCP: Evelena Peat Patient Gender: Female PCP Fax : [phone] Patient DOB: Feb 05, 1946 [facility] Name[facility] Reason for Call: Pt calling concerned with cough and cold symptons. Pt is afebrile. Onset 11/01. Request medication be called in . Home care advice given for ORS. No emergent s/s. Call back parameters given. Protocol(s) Used: Upper Respiratory Infection (URI) Recommended Outcome per Protocol: Provide Home/Self Care Reason for Outcome: New onset of two or more of the following symptoms: nasal congestion with runny nose; sneezing; itchy or mild sore throat; mild headache or body aches; mild fatigue; low grade fever up to 101.5 F (38.6C) usually lasting about a week Care Advice: ~ Use a cool mist humidifier to moisten air. Be sure to clean according to manufacturer's instructions. ~ Call provider if symptoms worsen or new symptoms develop. ~ Consider use of a saline nasal spray per package directions to help relieve nasal congestion. ~ HEALTH PROMOTION / MAINTENANCE Mild symptoms of a cold can be expected to last 7 to 10 days. Sometimes, a cough associated with a cold can last up to 3 weeks. Over-the-counter cold medications may temporarily relieve the symptoms, but do not shorten the length of the cold. ~ ~ SYMPTOM / CONDITION MANAGEMENT A warm, moist compress placed on face, over eyes for 15 to 20 minutes, 5 to 6 times a day, may help relieve the congestion. ~ Consider nonprescription decongestant (Sudafed, Drixoral) for relief of symptoms after checking with a provider, especially if there is a history of hypertension, hyperthyroidism, heart disease, diabetes, glaucoma, urinary retention caused by prostatic hypertrophy. ~ During pregnancy or when breastfeeding, do not take  nonprescription, complementary/alternative medication(s) without the approval of provider ~ Most adults need to drink 6-10 eight-ounce glasses (1.2-2.0 liters) of fluids per day unless previously told to limit fluid intake for other medical reasons. Limit fluids that contain caffeine, sugar or alcohol. Urine will be a very light yellow color when you drink enough fluids. ~ Sore Throat Relief: - Use warm salt water gargles 3 to 4 times/day, as needed (1/2 tsp. salt in 8 oz. [.2 liters] water). - Suck on hard candy, nonprescription or herbal throat lozenges (sugar-free if diabetic) - Eat soothing, soft food/fluids (broths, soups, or honey and lemon juice in hot tea, Popsicles, frozen yogurt or sherbet, scrambled eggs, cooked cereals, Jell-O or puddings) whichever is most comforting. - Avoid eating salty, spicy or acidic foods. ~ Be aware that some nonprescription drugs contain both an antihistamine and decongestant. If taking a combination drug, do not take an additional decongestant. ~ Respiratory Hygiene: - Cover the nose/mouth tightly with a tissue when coughing or sneezing. - Use tissue 1 time and discard in the nearest waste receptacle. - Wash hands with soap and water or alcohol-based hand rub after coming into contact with respiratory secretions and contaminated objects/materials. ~ 03/17/2011 8:33:42AM Page 1 of 2 [id number] [facility] Triage Call Report Patient Name: Cassidy Norton continuation page/s - Alternatively when no tissue is available, cough into the bend of the elbow. - .Avoid touching your eyes, nose or mouth.

## 2011-05-02 ENCOUNTER — Other Ambulatory Visit: Payer: Self-pay | Admitting: *Deleted

## 2011-05-02 MED ORDER — ZOLPIDEM TARTRATE 10 MG PO TABS: 10.0000 mg | ORAL_TABLET | Freq: Every evening | ORAL | Status: DC | PRN

## 2011-05-03 NOTE — Telephone Encounter (Signed)
rx called in

## 2011-05-10 ENCOUNTER — Ambulatory Visit (INDEPENDENT_AMBULATORY_CARE_PROVIDER_SITE_OTHER): Payer: Medicare Other | Admitting: Family Medicine

## 2011-05-10 ENCOUNTER — Encounter: Payer: Self-pay | Admitting: Family Medicine

## 2011-05-10 VITALS — BP 120/76 | Temp 97.7°F | Wt 150.0 lb

## 2011-05-10 DIAGNOSIS — R609 Edema, unspecified: Secondary | ICD-10-CM

## 2011-05-10 DIAGNOSIS — E039 Hypothyroidism, unspecified: Secondary | ICD-10-CM

## 2011-05-10 DIAGNOSIS — R600 Localized edema: Secondary | ICD-10-CM

## 2011-05-10 LAB — COMPREHENSIVE METABOLIC PANEL
ALT: 16 U/L (ref 0–35)
AST: 18 U/L (ref 0–37)
Alkaline Phosphatase: 85 U/L (ref 39–117)
CO2: 26 mEq/L (ref 19–32)
Creatinine, Ser: 0.6 mg/dL (ref 0.4–1.2)
GFR: 107.82 mL/min (ref >60.00)
Sodium: 141 mEq/L (ref 135–145)
Total Bilirubin: 0.4 mg/dL (ref 0.3–1.2)

## 2011-05-10 LAB — POCT URINALYSIS DIPSTICK
Bilirubin, UA: NEGATIVE
Glucose, UA: NEGATIVE
Ketones, UA: NEGATIVE
Nitrite, UA: NEGATIVE

## 2011-05-10 MED ORDER — FUROSEMIDE 20 MG PO TABS: 20.0000 mg | ORAL_TABLET | Freq: Every day | ORAL | Status: DC

## 2011-05-10 NOTE — Progress Notes (Signed)
Subjective:    [name], female    DOB: 1945-12-21, 65 y.o.   MRN: [id number]  HPI 65 year old white female, former smoker, patient of Dr. Caryl Never is in today with bilateral leg and feet swelling skin warm for one to 2 weeks and not improving. She denies any increase in sodium intake. She has recently stopped walking as usual in the mornings. Denies any chest pain, shortness of breath, palpitations, nausea or vomiting.   Review of Systems  Constitutional: Negative.   Respiratory: Negative.   Cardiovascular: Positive for leg swelling.  Gastrointestinal: Negative.   Genitourinary: Negative.   Musculoskeletal: Negative.   Hematological: Negative.   Psychiatric/Behavioral: Negative.    Past Medical History  Diagnosis Date  . HYPOTHYROIDISM 07/20/2008  . Abdominal pain, epigastric 08/22/2009  . Chronic low back pain   . GERD (gastroesophageal reflux disease)   . Diverticulosis   . Hemorrhoids   . Adenomatous colon polyp     History   Social History  . Marital Status: Married    Spouse Name: N/A    Number of Children: 1  . Years of Education: N/A   Occupational History  . Retired    Social History Main Topics  . Smoking status: Former Smoker -- 1.5 packs/day for 40 years    Types: Cigarettes    Quit date: 08/28/2003  . Smokeless tobacco: Never Used  . Alcohol Use: No  . Drug Use: No  . Sexually Active: Not on file   Other Topics Concern  . Not on file   Social History Narrative  . No narrative on file    Past Surgical History  Procedure Date  . Cholecystectomy   . Abdominal hysterectomy   . Blepharoplasty     Family History  Problem Relation Age of Onset  . Cancer Mother     bladder CA  . Diabetes Mother   . Cancer Father     gallblader CA  . Diabetes Sister   . Diabetes Brother   . Heart disease Father   . Heart disease Brother   . Heart disease Daughter     Allergies  Allergen Reactions  . Aspirin     REACTION: Hives     Current Outpatient Prescriptions on File Prior to Visit  Medication Sig Dispense Refill  . estradiol (ESTRACE) 0.5 MG tablet Take 2 tablets by mouth daily.  180 tablet  4  . levothyroxine (SYNTHROID, LEVOTHROID) 100 MCG tablet Take 100 mcg by mouth daily.        . meloxicam (MOBIC) 15 MG tablet Take 15 mg by mouth daily.        Marland Kitchen omeprazole (PRILOSEC) 20 MG capsule TAKE ONE CAPSULE BY MOUTH EVERY DAY  30 capsule  0  . Psyllium (METAMUCIL) 30.9 % POWD Take by mouth. One tsp daily at bedtime       . zolpidem (AMBIEN) 10 MG tablet Take 1 tablet (10 mg total) by mouth at bedtime as needed for sleep.  30 tablet  2  . cyclobenzaprine (FLEXERIL) 5 MG tablet Take 1 tablet (5 mg total) by mouth at bedtime as needed for muscle spasms.  90 tablet  3    BP 120/76  Temp(Src) 97.7 F (36.5 C) (Oral)  Wt 150 lb (68.04 kg)chart   Objective:   Physical Exam  Constitutional: She is oriented to person, place, and time. She appears well-developed and well-nourished.  HENT:  Head: Normocephalic.  Right Ear: External ear normal.  Left Ear: External  ear normal.  Eyes: Conjunctivae are normal.  Neck: Neck supple.  Cardiovascular: Normal rate, regular rhythm and normal heart sounds.   Pulmonary/Chest: Effort normal and breath sounds normal.  Abdominal: Soft. Bowel sounds are normal.  Musculoskeletal: Normal range of motion.  Neurological: She is alert and oriented to person, place, and time.  Skin: Skin is warm and dry.  Psychiatric: She has a normal mood and affect.         EKG: Within normal limits, no acute abnormality Assessment & Plan:  Assessment: Bilateral peripheral edema, hypothyroidism  The plan: Lantus insulin to CMP, urinalysis, and TSH. Will start patient on Lasix 20 mg once daily. Recheck in 2 weeks. Patient to call with any questions or concerns. Recheck as discussed and when necessary sooner

## 2011-05-10 NOTE — Patient Instructions (Signed)
Peripheral Edema You have swelling in your legs (peripheral edema). This swelling is due to excess accumulation of salt and water in your body. Edema may be a sign of heart, kidney or liver disease, or a side effect of a medication. It may also be due to problems in the leg veins. Elevating your legs and using special support stockings may be very helpful, if the cause of the swelling is due to poor venous circulation. Avoid long periods of standing, whatever the cause. Treatment of edema depends on identifying the cause. Chips, pretzels, pickles and other salty foods should be avoided. Restricting salt in your diet is almost always needed. Water pills (diuretics) are often used to remove the excess salt and water from your body via urine. These medicines prevent the kidney from reabsorbing sodium. This increases urine flow. Diuretic treatment may also result in lowering of potassium levels in your body. Potassium supplements may be needed if you have to use diuretics daily. Daily weights can help you keep track of your progress in clearing your edema. You should call your caregiver for follow up care as recommended. SEEK IMMEDIATE MEDICAL CARE IF:   You have increased swelling, pain, redness, or heat in your legs.   You develop shortness of breath, especially when lying down.   You develop chest or abdominal pain, weakness, or fainting.   You have a fever.  Document Released: 06/07/2004 Document Revised: 01/10/2011 Document Reviewed: 05/18/2009 Exit[employer] Patient Information 2012 [employer]. 

## 2011-05-11 ENCOUNTER — Telehealth: Payer: Self-pay | Admitting: Family Medicine

## 2011-05-11 NOTE — Telephone Encounter (Signed)
Pt was in yesterday and is requesting results of labs and UA

## 2011-05-11 NOTE — Telephone Encounter (Signed)
Left message for pt to call back  °

## 2011-05-14 MED ORDER — LEVOTHYROXINE SODIUM 88 MCG PO TABS: 88.0000 ug | ORAL_TABLET | Freq: Every day | ORAL | Status: DC

## 2011-05-14 NOTE — Telephone Encounter (Signed)
Pt is calling back requesting labs and UA results

## 2011-05-14 NOTE — Telephone Encounter (Signed)
Per PC, TSH is low, recommend decreasing synthroid to 88 mcg. UA is normal.  Pt aware and verbalized understanding. 90 day sent to [facility] and 30 day supply sent to local

## 2011-05-21 ENCOUNTER — Encounter: Payer: Self-pay | Admitting: *Deleted

## 2011-05-21 NOTE — Progress Notes (Signed)
[name], female   DOB: 12-18-1945, 66 y.o.   MRN: [id number] Pt had question why she was taking estradiol 0.5 mg 2 tablets. Pt estradiol 1 mg was on back order, pt was okay with this and will continue as directed.

## 2011-05-28 ENCOUNTER — Other Ambulatory Visit: Payer: Self-pay | Admitting: Family Medicine

## 2011-06-28 ENCOUNTER — Other Ambulatory Visit: Payer: Self-pay | Admitting: Family Medicine

## 2011-07-23 ENCOUNTER — Ambulatory Visit (INDEPENDENT_AMBULATORY_CARE_PROVIDER_SITE_OTHER): Payer: Medicare Other | Admitting: Gynecology

## 2011-07-23 ENCOUNTER — Encounter: Payer: Self-pay | Admitting: Gynecology

## 2011-07-23 VITALS — BP 130/88 | Ht 62.75 in | Wt 151.0 lb

## 2011-07-23 DIAGNOSIS — E039 Hypothyroidism, unspecified: Secondary | ICD-10-CM | POA: Diagnosis not present

## 2011-07-23 DIAGNOSIS — Z780 Asymptomatic menopausal state: Secondary | ICD-10-CM | POA: Diagnosis not present

## 2011-07-23 DIAGNOSIS — R632 Polyphagia: Secondary | ICD-10-CM | POA: Diagnosis not present

## 2011-07-23 DIAGNOSIS — R635 Abnormal weight gain: Secondary | ICD-10-CM

## 2011-07-23 DIAGNOSIS — Z01419 Encounter for gynecological examination (general) (routine) without abnormal findings: Secondary | ICD-10-CM

## 2011-07-23 LAB — TSH: TSH: 1.124 u[IU]/mL (ref 0.350–4.500)

## 2011-07-23 MED ORDER — ESTRADIOL 1 MG PO TABS: 1.0000 mg | ORAL_TABLET | Freq: Every day | ORAL | Status: DC
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
## 2011-07-25 ENCOUNTER — Telehealth: Payer: Self-pay | Admitting: *Deleted

## 2011-07-25 NOTE — Telephone Encounter (Signed)
Informed tsh wnl

## 2011-07-30 DIAGNOSIS — Z1231 Encounter for screening mammogram for malignant neoplasm of breast: Secondary | ICD-10-CM | POA: Diagnosis not present

## 2011-07-30 LAB — HM MAMMOGRAPHY

## 2011-07-31 ENCOUNTER — Encounter: Payer: Self-pay | Admitting: Family Medicine

## 2011-09-25 ENCOUNTER — Other Ambulatory Visit: Payer: Self-pay | Admitting: *Deleted

## 2011-09-25 MED ORDER — LEVOTHYROXINE SODIUM 88 MCG PO TABS: 88.0000 ug | ORAL_TABLET | Freq: Every day | ORAL | Status: DC

## 2011-10-01 DIAGNOSIS — H16229 Keratoconjunctivitis sicca, not specified as Sjogren's, unspecified eye: Secondary | ICD-10-CM | POA: Diagnosis not present

## 2011-10-01 DIAGNOSIS — Z961 Presence of intraocular lens: Secondary | ICD-10-CM | POA: Diagnosis not present

## 2011-10-01 DIAGNOSIS — H35369 Drusen (degenerative) of macula, unspecified eye: Secondary | ICD-10-CM | POA: Diagnosis not present

## 2011-10-01 DIAGNOSIS — H04129 Dry eye syndrome of unspecified lacrimal gland: Secondary | ICD-10-CM | POA: Diagnosis not present

## 2011-12-12 ENCOUNTER — Other Ambulatory Visit: Payer: Self-pay | Admitting: *Deleted

## 2011-12-12 MED ORDER — ZOLPIDEM TARTRATE 10 MG PO TABS: 10.0000 mg | ORAL_TABLET | Freq: Every evening | ORAL | Status: DC | PRN

## 2011-12-12 NOTE — Telephone Encounter (Signed)
rx called in

## 2012-01-22 ENCOUNTER — Other Ambulatory Visit: Payer: Self-pay | Admitting: Internal Medicine

## 2012-02-15 ENCOUNTER — Encounter: Payer: Self-pay | Admitting: Internal Medicine

## 2012-02-25 ENCOUNTER — Other Ambulatory Visit: Payer: Self-pay | Admitting: Family Medicine

## 2012-02-26 ENCOUNTER — Other Ambulatory Visit: Payer: Self-pay | Admitting: *Deleted

## 2012-02-26 MED ORDER — LEVOTHYROXINE SODIUM 88 MCG PO TABS: 88.0000 ug | ORAL_TABLET | Freq: Every day | ORAL | Status: DC

## 2012-02-27 ENCOUNTER — Other Ambulatory Visit: Payer: Self-pay | Admitting: Gynecology

## 2012-02-27 NOTE — Telephone Encounter (Signed)
REFILL PHONED IN TO PHARMACY.

## 2012-02-27 NOTE — Telephone Encounter (Signed)
Take 1 po PRN insomnia

## 2012-03-04 ENCOUNTER — Ambulatory Visit: Payer: Medicare Other | Admitting: Family Medicine

## 2012-03-12 ENCOUNTER — Ambulatory Visit (INDEPENDENT_AMBULATORY_CARE_PROVIDER_SITE_OTHER): Payer: Medicare Other | Admitting: Family Medicine

## 2012-03-12 ENCOUNTER — Encounter: Payer: Self-pay | Admitting: Family Medicine

## 2012-03-12 VITALS — BP 120/60 | Temp 97.5°F | Wt 146.0 lb

## 2012-03-12 DIAGNOSIS — R42 Dizziness and giddiness: Secondary | ICD-10-CM

## 2012-03-12 DIAGNOSIS — E039 Hypothyroidism, unspecified: Secondary | ICD-10-CM

## 2012-03-12 DIAGNOSIS — Z23 Encounter for immunization: Secondary | ICD-10-CM

## 2012-03-12 LAB — CBC WITH DIFFERENTIAL/PLATELET
Basophils Relative: 0.4 % (ref 0.0–3.0)
Eosinophils Relative: 2.1 % (ref 0.0–5.0)
Lymphocytes Relative: 34.2 % (ref 12.0–46.0)
Neutrophils Relative %: 56.6 % (ref 43.0–77.0)
RBC: 4.63 Mil/uL (ref 3.87–5.11)
WBC: 8.1 10*3/uL (ref 4.5–10.5)

## 2012-03-12 LAB — BASIC METABOLIC PANEL
Calcium: 8.9 mg/dL (ref 8.4–10.5)
Chloride: 108 mEq/L (ref 96–112)
Creatinine, Ser: 0.7 mg/dL (ref 0.4–1.2)
Sodium: 142 mEq/L (ref 135–145)

## 2012-03-12 NOTE — Progress Notes (Signed)
Quick Note:  Pt informed ______ 

## 2012-03-12 NOTE — Progress Notes (Signed)
  Subjective:    [name], female    DOB: 1945/11/21, 66 y.o.   MRN: [id number]  HPI  Patient complains of some lightheadedness and dizziness off and on for one month. Seems to be more position related. Relates recent blood pressure at dentist 85/52. Furosemide 20 mg as needed for edema but rarely takes. She does not take any other medications that would affect blood pressure. She has hypothyroidism treated with levofloxacin. Thyroid functions were normal about 7 months ago. Good appetite. No recent weight loss. No chest pains. No syncope. No palpitations.  Symptoms occur intermittently. Some sensation of disequilibrium. No history of anemia. Good fluid intake.  Past Medical History  Diagnosis Date  . HYPOTHYROIDISM 07/20/2008  . Abdominal pain, epigastric 08/22/2009  . Chronic low back pain   . GERD (gastroesophageal reflux disease)   . Diverticulosis   . Hemorrhoids   . Adenomatous colon polyp   . NSVD (normal spontaneous vaginal delivery)     X2   Past Surgical History  Procedure Date  . Cholecystectomy   . Blepharoplasty   . Dilation and curettage of uterus   . Abdominal hysterectomy     TVH  . Eye surgery 2008    BILATERAL CATARACT   . Back surgery     RUPTURED DISC    reports that she quit smoking about 8 years ago. Her smoking use included Cigarettes. She has a 60 pack-year smoking history. She has never used smokeless tobacco. She reports that she does not drink alcohol or use illicit drugs. family history includes Cancer in her father and mother; Diabetes in her brother, mother, and sister; Heart disease in her brother, daughter, and father; and Hypertension in her brother and sister. Allergies  Allergen Reactions  . Aspirin     REACTION: Hives      Review of Systems  Constitutional: Negative for fever, chills, appetite change and unexpected weight change.  Eyes: Negative for visual disturbance.  Respiratory: Negative for cough and shortness of  breath.   Cardiovascular: Negative for chest pain, palpitations and leg swelling.  Gastrointestinal: Negative for abdominal pain.  Genitourinary: Negative for dysuria.  Neurological: Positive for dizziness. Negative for seizures, syncope, weakness and headaches.  Hematological: Negative for adenopathy.  Psychiatric/Behavioral: Negative for dysphoric mood.       Objective:   Physical Exam  Constitutional: She is oriented to person, place, and time. She appears well-developed and well-nourished.  Neck: Neck supple. No thyromegaly present.  Cardiovascular: Normal rate and regular rhythm.   Pulmonary/Chest: Effort normal and breath sounds normal. No respiratory distress. She has no wheezes. She has no rales.  Musculoskeletal: She exhibits no edema.  Neurological: She is alert and oriented to person, place, and time. No cranial nerve deficit.       Cerebellar function normal  Psychiatric: She has a normal mood and affect. Her behavior is normal.          Assessment & Plan:  Nonspecific dizziness. She describes this as lightheadedness but no significant orthostatic drop. Check labs with TSH, CBC, and basic metabolic panel. She does not appear dehydrated. She has some sensation of disequilibrium and this may represent more mild vertigo. Nonfocal neurologic exam.

## 2012-03-14 DIAGNOSIS — M1710 Unilateral primary osteoarthritis, unspecified knee: Secondary | ICD-10-CM | POA: Diagnosis not present

## 2012-03-28 DIAGNOSIS — M47812 Spondylosis without myelopathy or radiculopathy, cervical region: Secondary | ICD-10-CM | POA: Diagnosis not present

## 2012-03-28 DIAGNOSIS — M5137 Other intervertebral disc degeneration, lumbosacral region: Secondary | ICD-10-CM | POA: Diagnosis not present

## 2012-03-28 DIAGNOSIS — M5030 Other cervical disc degeneration, unspecified cervical region: Secondary | ICD-10-CM | POA: Diagnosis not present

## 2012-04-09 DIAGNOSIS — B370 Candidal stomatitis: Secondary | ICD-10-CM | POA: Diagnosis not present

## 2012-04-09 DIAGNOSIS — R030 Elevated blood-pressure reading, without diagnosis of hypertension: Secondary | ICD-10-CM | POA: Diagnosis not present

## 2012-04-14 ENCOUNTER — Encounter: Payer: Self-pay | Admitting: Family Medicine

## 2012-04-14 ENCOUNTER — Ambulatory Visit (INDEPENDENT_AMBULATORY_CARE_PROVIDER_SITE_OTHER): Payer: Medicare Other | Admitting: Family Medicine

## 2012-04-14 VITALS — BP 130/68 | Temp 98.0°F | Wt 148.0 lb

## 2012-04-14 DIAGNOSIS — R0602 Shortness of breath: Secondary | ICD-10-CM

## 2012-04-14 DIAGNOSIS — R0789 Other chest pain: Secondary | ICD-10-CM | POA: Diagnosis not present

## 2012-04-14 NOTE — Patient Instructions (Signed)
Follow up immediately if you experience any significant shortness of breath with exertion, or pain or tightness in your chest and/or radiating down your arm.

## 2012-04-14 NOTE — Progress Notes (Signed)
Subjective:     Patient ID: [name], female   DOB: Sep 28, 1945, 66 y.o.   MRN: [id number]  HPI Patient here for evaluation of chest pain x1 month.  She states that the pain is intermittent over the past month in the L anterior axilla/inframammary fold area, and describes it like "a pinched nerve," coming on suddenly and lasting for roughly a minute.  She occasionally has diaphoresis and mild nausea, but not usually associated with this pain.  She denies that it radiates, or is related to exertion.  Denies any major exertional SOB.  Walks regularly for exercise.  She noted that her BP was high ~155/95 repeated 3x last week at [facility].    EKG today showed no acute changes from last year, 05/10/2011.  Review of Systems  Constitutional: Positive for diaphoresis. Negative for fatigue.  Respiratory: Negative for chest tightness and shortness of breath.   Cardiovascular: Positive for chest pain.       Objective:   Physical Exam  Constitutional: She appears well-developed and well-nourished. No distress.  Cardiovascular: Normal rate, regular rhythm and normal heart sounds.   Pulmonary/Chest: Effort normal and breath sounds normal. No respiratory distress.       Assessment:     66 year old here for evaluation of 1 month of intermittent chest pain.    Plan:     1. Chest pain: atypical, given the quality, duration and lack of association with exertion.  Likely musculoskeletal.  Less likely CV related, as pt does not have major exertional SOB, pain lasting 15-30 min, or radiating chest pain or tightness.  Continue to monitor, but advise patient to follow-up immediately should she develop exertional SOB or radiating pain or tightness.  Marthann Schiller, MS3     Agree with assessment and plan as per Marthann Schiller, MS 3 Pt has very atypical symptoms-fleeting (30 seconds) nonexertional and sharp.  No exertional dyspnea or chest pain.  EKG no change c/w prior tracing.  ? Neuropathic.   BP stable by reading here and continue to monitor.  Evelena Peat MD

## 2012-04-15 ENCOUNTER — Ambulatory Visit: Payer: Medicare Other | Admitting: Family Medicine

## 2012-04-17 DIAGNOSIS — M545 Low back pain: Secondary | ICD-10-CM | POA: Diagnosis not present

## 2012-04-17 DIAGNOSIS — M961 Postlaminectomy syndrome, not elsewhere classified: Secondary | ICD-10-CM | POA: Diagnosis not present

## 2012-04-17 DIAGNOSIS — M542 Cervicalgia: Secondary | ICD-10-CM | POA: Diagnosis not present

## 2012-04-22 ENCOUNTER — Ambulatory Visit (INDEPENDENT_AMBULATORY_CARE_PROVIDER_SITE_OTHER): Payer: Medicare Other | Admitting: Family Medicine

## 2012-04-22 ENCOUNTER — Encounter: Payer: Self-pay | Admitting: Family Medicine

## 2012-04-22 VITALS — BP 130/68 | Temp 97.6°F | Wt 146.0 lb

## 2012-04-22 DIAGNOSIS — J310 Chronic rhinitis: Secondary | ICD-10-CM

## 2012-04-22 MED ORDER — FLUTICASONE PROPIONATE 50 MCG/ACT NA SUSP: 2.0000 | Freq: Every day | NASAL | Status: DC

## 2012-04-22 NOTE — Progress Notes (Signed)
  Subjective:    [name], female    DOB: Sep 15, 1945, 66 y.o.   MRN: [id number]  HPI  Acute visit. Nasal congestion mostly right nostril. Present for about 4-5 days. She's had some intermittent mild sore throat. Denies any fever. No purulent nasal secretions. Occasional clear discharge right nostril. Right maxillary facial pain.    Review of Systems  Constitutional: Negative for fever and chills.  HENT: Positive for congestion, sore throat, rhinorrhea and sinus pressure. Negative for hearing loss and ear pain.   Respiratory: Negative for shortness of breath.   Neurological: Negative for headaches.       Objective:   Physical Exam  Constitutional: She appears well-developed and well-nourished.  HENT:  Right Ear: External ear normal.  Mouth/Throat: Oropharynx is clear and moist.       Cerumen left canal  She has significant turbinate swelling right naris and left is clear. No visible polyps. No obvious purulent secretions.  Neck: Neck supple.  Cardiovascular: Normal rate and regular rhythm.   Pulmonary/Chest: Effort normal and breath sounds normal. No respiratory distress. She has no wheezes. She has no rales.  Lymphadenopathy:    She has no cervical adenopathy.          Assessment & Plan:  Rhinitis. Question allergic versus acute viral. Flonase nasal 2 sprays per nostril once daily. No indication for antibiotic at this time.  Follow up promptly for fever or worsening symptoms and she'll also try over-the-counter saline nasal spray

## 2012-04-22 NOTE — Patient Instructions (Addendum)
Follow up promptly for any fever or pus like nasal discharge.

## 2012-04-30 ENCOUNTER — Encounter: Payer: Self-pay | Admitting: Internal Medicine

## 2012-05-14 HISTORY — PX: COLONOSCOPY: SHX174

## 2012-05-28 ENCOUNTER — Ambulatory Visit (AMBULATORY_SURGERY_CENTER): Payer: Medicare Other | Admitting: *Deleted

## 2012-05-28 VITALS — Ht 63.0 in | Wt 151.2 lb

## 2012-05-28 DIAGNOSIS — Z1211 Encounter for screening for malignant neoplasm of colon: Secondary | ICD-10-CM

## 2012-05-28 MED ORDER — MOVIPREP 100 G PO SOLR: ORAL | Status: DC

## 2012-06-12 ENCOUNTER — Encounter: Payer: Self-pay | Admitting: Internal Medicine

## 2012-06-12 ENCOUNTER — Ambulatory Visit (AMBULATORY_SURGERY_CENTER): Payer: Medicare Other | Admitting: Internal Medicine

## 2012-06-12 VITALS — BP 121/68 | HR 73 | Temp 96.0°F | Resp 34 | Ht 63.0 in | Wt 151.0 lb

## 2012-06-12 DIAGNOSIS — Z1211 Encounter for screening for malignant neoplasm of colon: Secondary | ICD-10-CM | POA: Diagnosis not present

## 2012-06-12 DIAGNOSIS — Z86010 Personal history of colonic polyps: Secondary | ICD-10-CM

## 2012-06-12 DIAGNOSIS — K219 Gastro-esophageal reflux disease without esophagitis: Secondary | ICD-10-CM | POA: Diagnosis not present

## 2012-06-12 MED ORDER — SODIUM CHLORIDE 0.9 % IV SOLN: 500.0000 mL | INTRAVENOUS | Status: DC

## 2012-06-12 NOTE — Progress Notes (Signed)
Patient assist to bathroom to aide in expelling. Moderate amount of air expelled. Abdomin soft and nondistended. Two levsin given SL to aide in passage of air. Air expelled. Patient laughing and talking upon discharge.

## 2012-06-12 NOTE — Progress Notes (Signed)
Lidocaine-40mg IV prior to Propofol InductionPropofol given over incremental dosages 

## 2012-06-12 NOTE — Progress Notes (Signed)
Patient did not experience any of the following events: a burn prior to discharge; a fall within the facility; wrong site/side/patient/procedure/implant event; or a hospital transfer or hospital admission upon discharge from the facility. (G8907) Patient did not have preoperative order for IV antibiotic SSI prophylaxis. (G8918)  

## 2012-06-12 NOTE — Patient Instructions (Addendum)
Discharge instructions given with verbal understanding. Handouts on diverticulosis and a high fiber diet given. Resume previous medications.YOU HAD AN ENDOSCOPIC PROCEDURE TODAY AT THE [facility]: Refer to the procedure report that was given to you for any specific questions about what was found during the examination.  If the procedure report does not answer your questions, please call your gastroenterologist to clarify.  If you requested that your care partner not be given the details of your procedure findings, then the procedure report has been included in a sealed envelope for you to review at your convenience later.  YOU SHOULD EXPECT: Some feelings of bloating in the abdomen. Passage of more gas than usual.  Walking can help get rid of the air that was put into your GI tract during the procedure and reduce the bloating. If you had a lower endoscopy (such as a colonoscopy or flexible sigmoidoscopy) you may notice spotting of blood in your stool or on the toilet paper. If you underwent a bowel prep for your procedure, then you may not have a normal bowel movement for a few days.  DIET: Your first meal following the procedure should be a light meal and then it is ok to progress to your normal diet.  A half-sandwich or bowl of soup is an example of a good first meal.  Heavy or fried foods are harder to digest and may make you feel nauseous or bloated.  Likewise meals heavy in dairy and vegetables can cause extra gas to form and this can also increase the bloating.  Drink plenty of fluids but you should avoid alcoholic beverages for 24 hours.  ACTIVITY: Your care partner should take you home directly after the procedure.  You should plan to take it easy, moving slowly for the rest of the day.  You can resume normal activity the day after the procedure however you should NOT DRIVE or use heavy machinery for 24 hours (because of the sedation medicines used during the test).    SYMPTOMS TO  REPORT IMMEDIATELY: A gastroenterologist can be reached at any hour.  During normal business hours, 8:30 AM to 5:00 PM Monday through Friday, call [phone].  After hours and on weekends, please call the GI answering service at [phone] who will take a message and have the physician on call contact you.   Following lower endoscopy (colonoscopy or flexible sigmoidoscopy):  Excessive amounts of blood in the stool  Significant tenderness or worsening of abdominal pains  Swelling of the abdomen that is new, acute  Fever of 100F or higher  FOLLOW UP: If any biopsies were taken you will be contacted by phone or by letter within the next 1-3 weeks.  Call your gastroenterologist if you have not heard about the biopsies in 3 weeks.  Our staff will call the home number listed on your records the next business day following your procedure to check on you and address any questions or concerns that you may have at that time regarding the information given to you following your procedure. This is a courtesy call and so if there is no answer at the home number and we have not heard from you through the emergency physician on call, we will assume that you have returned to your regular daily activities without incident.  SIGNATURES/CONFIDENTIALITY: You and/or your care partner have signed paperwork which will be entered into your electronic medical record.  These signatures attest to the fact that that the information above on your   After Visit Summary has been reviewed and is understood.  Full responsibility of the confidentiality of this discharge information lies with you and/or your care-partner.   

## 2012-06-12 NOTE — Op Note (Signed)
[facility] [address] [place], [address]   COLONOSCOPY PROCEDURE REPORT  PATIENT: Cassidy Norton, Cassidy Norton  MR#: [id number] BIRTHDATE: Feb 19, 1946 , 66  yrs. old GENDER: Female ENDOSCOPIST: Roxy Cedar, MD REFERRED AO:[id number] Program Recall PROCEDURE DATE:  06/12/2012 PROCEDURE:   Colonoscopy, surveillance ASA CLASS:   Class II INDICATIONS:Patient's personal history of adenomatous colon polyps. Index 01-2007 w/ small TA MEDICATIONS: MAC sedation, administered by CRNA and propofol (Diprivan) 200mg  IV  DESCRIPTION OF PROCEDURE:   After the risks benefits and alternatives of the procedure were thoroughly explained, informed consent was obtained.  A digital rectal exam revealed external hemorrhoids.   The LB [id number]  endoscope was introduced through the anus and advanced to the cecum, which was identified by both the appendix and ileocecal valve. No adverse events experienced.   The quality of the prep was good, using MoviPrep The instrument was then slowly withdrawn as the colon was fully examined.      COLON FINDINGS: Severe diverticulosis was noted throughout the entire examined colon.   The colon was otherwise normal.  There was no inflammation, polyps or cancers .  Retroflexed views revealed internal hemorrhoids. The time to cecum=8 minutes 27 seconds. Withdrawal time=12 minutes 04 seconds.  The scope was withdrawn and the procedure completed. COMPLICATIONS: There were no complications.  ENDOSCOPIC IMPRESSION: 1.   Severe diverticulosis was noted throughout the entire examined colon 2.   The colon was otherwise normal  RECOMMENDATIONS: 1.Continue current colorectal screening recommendations with a repeat colonoscopy in 10 years.   eSigned:  Roxy Cedar, MD 06/12/2012 10:51 AM   cc: Evelena Peat, MD and The Patient

## 2012-06-13 ENCOUNTER — Telehealth: Payer: Self-pay | Admitting: *Deleted

## 2012-06-13 NOTE — Telephone Encounter (Signed)
  Follow up Call-  Call back number 06/12/2012  Post procedure Call Back phone  # [phone]  Permission to leave phone message Yes     Patient questions:  Do you have a fever, pain , or abdominal swelling? no Pain Score  0 *  Have you tolerated food without any problems? yes  Have you been able to return to your normal activities? yes  Do you have any questions about your discharge instructions: Diet   no Medications  no Follow up visit  no  Do you have questions or concerns about your Care? no  Actions: * If pain score is 4 or above: No action needed, pain <4.

## 2012-06-14 ENCOUNTER — Other Ambulatory Visit: Payer: Self-pay | Admitting: Family Medicine

## 2012-06-14 ENCOUNTER — Other Ambulatory Visit: Payer: Self-pay | Admitting: Gynecology

## 2012-07-24 ENCOUNTER — Encounter: Payer: Self-pay | Admitting: Gynecology

## 2012-07-24 ENCOUNTER — Ambulatory Visit: Payer: Medicare Other | Admitting: Gynecology

## 2012-07-24 VITALS — BP 132/74 | Ht 62.5 in | Wt 150.0 lb

## 2012-07-24 DIAGNOSIS — R635 Abnormal weight gain: Secondary | ICD-10-CM | POA: Insufficient documentation

## 2012-07-24 DIAGNOSIS — N952 Postmenopausal atrophic vaginitis: Secondary | ICD-10-CM

## 2012-07-24 DIAGNOSIS — N9089 Other specified noninflammatory disorders of vulva and perineum: Secondary | ICD-10-CM

## 2012-07-24 DIAGNOSIS — Z79890 Hormone replacement therapy (postmenopausal): Secondary | ICD-10-CM

## 2012-07-24 DIAGNOSIS — Z23 Encounter for immunization: Secondary | ICD-10-CM

## 2012-07-24 DIAGNOSIS — N766 Ulceration of vulva: Secondary | ICD-10-CM | POA: Diagnosis not present

## 2012-07-24 DIAGNOSIS — Z780 Asymptomatic menopausal state: Secondary | ICD-10-CM

## 2012-07-24 MED ORDER — CLOBETASOL PROPIONATE 0.05 % EX CREA: TOPICAL_CREAM | CUTANEOUS | Status: DC

## 2012-07-24 NOTE — Addendum Note (Signed)
Addended by: Bertram Savin A on: 07/24/2012 11:31 AM   Modules accepted: Orders
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
## 2012-07-24 NOTE — Addendum Note (Signed)
Addended by: Bertram Savin A on: 07/24/2012 02:38 PM   Modules accepted: Orders, Medications

## 2012-07-24 NOTE — Patient Instructions (Signed)

## 2012-07-31 DIAGNOSIS — Z1231 Encounter for screening mammogram for malignant neoplasm of breast: Secondary | ICD-10-CM | POA: Diagnosis not present

## 2012-08-01 ENCOUNTER — Encounter: Payer: Self-pay | Admitting: Gynecology

## 2012-08-04 ENCOUNTER — Encounter: Payer: Self-pay | Admitting: Family Medicine

## 2012-08-07 ENCOUNTER — Encounter: Payer: Self-pay | Admitting: Gynecology

## 2012-08-07 ENCOUNTER — Ambulatory Visit (INDEPENDENT_AMBULATORY_CARE_PROVIDER_SITE_OTHER): Payer: Medicare Other | Admitting: Gynecology

## 2012-08-07 VITALS — BP 124/76 | Wt 145.0 lb

## 2012-08-07 DIAGNOSIS — N952 Postmenopausal atrophic vaginitis: Secondary | ICD-10-CM | POA: Insufficient documentation

## 2012-08-07 MED ORDER — NONFORMULARY OR COMPOUNDED ITEM: Status: DC

## 2012-08-07 NOTE — Patient Instructions (Addendum)
Bone Densitometry Bone densitometry is a special X-ray that measures your bone density and can be used to help predict your risk of bone fractures. This test is used to determine bone mineral content and density to diagnose osteoporosis. Osteoporosis is the loss of bone that may cause the bone to become weak. Osteoporosis commonly occurs in women entering menopause. However, it may be found in men and in people with other diseases. PREPARATION FOR TEST No preparation necessary. WHO SHOULD BE TESTED?  All women older than 65.  Postmenopausal women (50 to 65) with risk factors for osteoporosis.  People with a previous fracture caused by normal activities.  People with a small body frame (less than 127 poundsor a body mass index [BMI] of less than 21).  People who have a parent with a hip fracture or history of osteoporosis.  People who smoke.  People who have rheumatoid arthritis.  Anyone who engages in excessive alcohol use (more than 3 drinks most days).  Women who experience early menopause. WHEN SHOULD YOU BE RETESTED? Current guidelines suggest that you should wait at least 2 years before doing a bone density test again if your first test was normal.Recent studies indicated that women with normal bone density may be able to wait a few years before needing to repeat a bone density test. You should discuss this with your caregiver.  NORMAL FINDINGS   Normal: less than standard deviation below normal (greater than -1).  Osteopenia: 1 to 2.5 standard deviations below normal (-1 to -2.5).  Osteoporosis: greater than 2.5 standard deviations below normal (less than -2.5). Test results are reported as a "T score" and a "Z score."The T score is a number that compares your bone density with the bone density of healthy, young women.The Z score is a number that compares your bone density with the scores of women who are the same age, gender, and race.  Ranges for normal findings may vary  among different laboratories and hospitals. You should always check with your doctor after having lab work or other tests done to discuss the meaning of your test results and whether your values are considered within normal limits. MEANING OF TEST  Your caregiver will go over the test results with you and discuss the importance and meaning of your results, as well as treatment options and the need for additional tests if necessary. OBTAINING THE TEST RESULTS It is your responsibility to obtain your test results. Ask the lab or department performing the test when and how you will get your results. Document Released: 05/22/2004 Document Revised: 07/23/2011 Document Reviewed: 06/14/2010 ExitCare Patient Information 2013 [employer].  

## 2012-08-07 NOTE — Progress Notes (Signed)
Patient is a 67 year old was seen in the office for annual exam on March 13 and a nonspecific vulvar fourchette irritation was noted and she was placed on clobetasol 0.05% to apply twice a day for one week and then the following week once before bedtime and then to see me in 2 weeks for followup. She's here for followup today.patient was complaining of some irritation and pruritus near the clitoral hood.  Exam: Bartholin urethra Skene small white irritated area in the clitoral hood was noted similar to what was seen in the area of the fourchette recently. This is suspicious for lichen sclerosis.  Patient will be instructed to continue the clobetasol 3 times a week for one month. She is tapering off her oral estrogen and we'll started on vaginal estrogen twice a week (1 she finishes her oral estrogen)n for vaginal atrophy. She will return back to the office in 6 months for followup.

## 2012-08-15 ENCOUNTER — Other Ambulatory Visit: Payer: Self-pay | Admitting: Family Medicine

## 2012-08-20 ENCOUNTER — Telehealth: Payer: Self-pay | Admitting: Family Medicine

## 2012-08-20 ENCOUNTER — Telehealth: Payer: Self-pay | Admitting: Internal Medicine

## 2012-08-20 MED ORDER — LEVOTHYROXINE SODIUM 88 MCG PO TABS: 88.0000 ug | ORAL_TABLET | Freq: Every day | ORAL | Status: DC

## 2012-08-20 MED ORDER — OMEPRAZOLE 20 MG PO CPDR: 20.0000 mg | DELAYED_RELEASE_CAPSULE | Freq: Every day | ORAL | Status: DC

## 2012-08-20 NOTE — Telephone Encounter (Signed)
Refilled Omeprazole 

## 2012-08-20 NOTE — Telephone Encounter (Signed)
Pt requesting RX refill through [facility] order, levothyroxine (SYNTHROID) 88 MCG tablet. Please assist.

## 2012-08-28 ENCOUNTER — Ambulatory Visit (INDEPENDENT_AMBULATORY_CARE_PROVIDER_SITE_OTHER): Payer: Medicare Other

## 2012-08-28 DIAGNOSIS — Z780 Asymptomatic menopausal state: Secondary | ICD-10-CM

## 2012-09-10 DIAGNOSIS — L723 Sebaceous cyst: Secondary | ICD-10-CM | POA: Diagnosis not present

## 2012-09-10 DIAGNOSIS — L719 Rosacea, unspecified: Secondary | ICD-10-CM | POA: Diagnosis not present

## 2012-09-10 DIAGNOSIS — D239 Other benign neoplasm of skin, unspecified: Secondary | ICD-10-CM | POA: Diagnosis not present

## 2012-09-10 DIAGNOSIS — L570 Actinic keratosis: Secondary | ICD-10-CM | POA: Diagnosis not present

## 2012-10-03 DIAGNOSIS — H35039 Hypertensive retinopathy, unspecified eye: Secondary | ICD-10-CM | POA: Diagnosis not present

## 2012-10-03 DIAGNOSIS — H26499 Other secondary cataract, unspecified eye: Secondary | ICD-10-CM | POA: Diagnosis not present

## 2012-10-03 DIAGNOSIS — H35369 Drusen (degenerative) of macula, unspecified eye: Secondary | ICD-10-CM | POA: Diagnosis not present

## 2012-10-03 DIAGNOSIS — Z961 Presence of intraocular lens: Secondary | ICD-10-CM | POA: Diagnosis not present

## 2012-10-03 DIAGNOSIS — H01009 Unspecified blepharitis unspecified eye, unspecified eyelid: Secondary | ICD-10-CM | POA: Diagnosis not present

## 2012-10-03 DIAGNOSIS — H04129 Dry eye syndrome of unspecified lacrimal gland: Secondary | ICD-10-CM | POA: Diagnosis not present

## 2012-10-16 ENCOUNTER — Telehealth: Payer: Self-pay | Admitting: *Deleted

## 2012-10-16 NOTE — Telephone Encounter (Signed)
Pt called asking how long should she use estradiol cream given on OV 08/07/12. C/o some days cream works well and other days it does not. Pt has only tried 1 tube and will continue cream and follow up as needed.

## 2012-11-11 ENCOUNTER — Ambulatory Visit (INDEPENDENT_AMBULATORY_CARE_PROVIDER_SITE_OTHER): Payer: Medicare Other | Admitting: Gynecology

## 2012-11-11 ENCOUNTER — Encounter: Payer: Self-pay | Admitting: Gynecology

## 2012-11-11 VITALS — BP 130/76

## 2012-11-11 DIAGNOSIS — B373 Candidiasis of vulva and vagina: Secondary | ICD-10-CM | POA: Diagnosis not present

## 2012-11-11 DIAGNOSIS — R300 Dysuria: Secondary | ICD-10-CM | POA: Diagnosis not present

## 2012-11-11 LAB — URINALYSIS W MICROSCOPIC + REFLEX CULTURE
Crystals: NONE SEEN
Glucose, UA: NEGATIVE mg/dL
Leukocytes, UA: NEGATIVE
Protein, ur: NEGATIVE mg/dL
Specific Gravity, Urine: 1.015 (ref 1.005–1.030)
Urobilinogen, UA: 1 mg/dL (ref 0.0–1.0)
WBC, UA: NONE SEEN WBC/hpf (ref <3)

## 2012-11-11 MED ORDER — NYSTATIN-TRIAMCINOLONE 100000-0.1 UNIT/GM-% EX CREA: TOPICAL_CREAM | CUTANEOUS | Status: DC

## 2012-11-11 NOTE — Patient Instructions (Addendum)
Monilial Vaginitis  Vaginitis in a soreness, swelling and redness (inflammation) of the vagina and vulva. Monilial vaginitis is not a sexually transmitted infection.  CAUSES   Yeast vaginitis is caused by yeast (candida) that is normally found in your vagina. With a yeast infection, the candida has overgrown in number to a point that upsets the chemical balance.  SYMPTOMS   · White, thick vaginal discharge.  · Swelling, itching, redness and irritation of the vagina and possibly the lips of the vagina (vulva).  · Burning or painful urination.  · Painful intercourse.  DIAGNOSIS   Things that may contribute to monilial vaginitis are:  · Postmenopausal and virginal states.  · Pregnancy.  · Infections.  · Being tired, sick or stressed, especially if you had monilial vaginitis in the past.  · Diabetes. Good control will help lower the chance.  · Birth control pills.  · Tight fitting garments.  · Using bubble bath, feminine sprays, douches or deodorant tampons.  · Taking certain medications that kill germs (antibiotics).  · Sporadic recurrence can occur if you become ill.  TREATMENT   Your caregiver will give you medication.  · There are several kinds of anti monilial vaginal creams and suppositories specific for monilial vaginitis. For recurrent yeast infections, use a suppository or cream in the vagina 2 times a week, or as directed.  · Anti-monilial or steroid cream for the itching or irritation of the vulva may also be used. Get your caregiver's permission.  · Painting the vagina with methylene blue solution may help if the monilial cream does not work.  · Eating yogurt may help prevent monilial vaginitis.  HOME CARE INSTRUCTIONS   · Finish all medication as prescribed.  · Do not have sex until treatment is completed or after your caregiver tells you it is okay.  · Take warm sitz baths.  · Do not douche.  · Do not use tampons, especially scented ones.  · Wear cotton underwear.  · Avoid tight pants and panty  hose.  · Tell your sexual partner that you have a yeast infection. They should go to their caregiver if they have symptoms such as mild rash or itching.  · Your sexual partner should be treated as well if your infection is difficult to eliminate.  · Practice safer sex. Use condoms.  · Some vaginal medications cause latex condoms to fail. Vaginal medications that harm condoms are:  · Cleocin cream.  · Butoconazole (Femstat®).  · Terconazole (Terazol®) vaginal suppository.  · Miconazole (Monistat®) (may be purchased over the counter).  SEEK MEDICAL CARE IF:   · You have a temperature by mouth above 102° F (38.9° C).  · The infection is getting worse after 2 days of treatment.  · The infection is not getting better after 3 days of treatment.  · You develop blisters in or around your vagina.  · You develop vaginal bleeding, and it is not your menstrual period.  · You have pain when you urinate.  · You develop intestinal problems.  · You have pain with sexual intercourse.  Document Released: 02/07/2005 Document Revised: 07/23/2011 Document Reviewed: 10/22/2008  ExitCare® Patient Information ©2014 [employer].

## 2012-11-11 NOTE — Progress Notes (Addendum)
The patient presented to the office today complaining of vaginal irritation. She is postmenopausal and currently using estradiol 0.02% twice a week.  Exam: The area fourchette appears to be irritated like a paper cut and tender but nonpurulent.  Assessment/plan: Vulvitis will be treated with Mycolog cream to apply twice a day for the first week followed by each bedtime the second week. During this time she will stop the estradiol vaginal and restart after she finishes the Mycolog. I have asked her to apply the estradiol not only in the vagina those 2 days a week but also externally as well. She should refrain from intercourse for the 2 weeks.urinalysis was negative.

## 2012-11-11 NOTE — Addendum Note (Signed)
Addended by: Bertram Savin A on: 11/11/2012 02:43 PM   Modules accepted: Orders

## 2013-01-22 ENCOUNTER — Encounter: Payer: Self-pay | Admitting: Gynecology

## 2013-01-22 ENCOUNTER — Ambulatory Visit (INDEPENDENT_AMBULATORY_CARE_PROVIDER_SITE_OTHER): Payer: Medicare Other | Admitting: Gynecology

## 2013-01-22 VITALS — BP 132/70

## 2013-01-22 DIAGNOSIS — Q519 Congenital malformation of uterus and cervix, unspecified: Secondary | ICD-10-CM | POA: Diagnosis not present

## 2013-01-22 DIAGNOSIS — N951 Menopausal and female climacteric states: Secondary | ICD-10-CM | POA: Diagnosis not present

## 2013-01-22 DIAGNOSIS — Q525 Fusion of labia: Secondary | ICD-10-CM

## 2013-01-22 DIAGNOSIS — N952 Postmenopausal atrophic vaginitis: Secondary | ICD-10-CM | POA: Diagnosis not present

## 2013-01-22 MED ORDER — ESTRADIOL 0.5 MG PO TABS: 0.5000 mg | ORAL_TABLET | Freq: Every day | ORAL | Status: DC

## 2013-01-22 MED ORDER — NONFORMULARY OR COMPOUNDED ITEM: Status: DC

## 2013-01-22 NOTE — Patient Instructions (Addendum)

## 2013-01-22 NOTE — Progress Notes (Signed)
Patient presented to the office today for six-month followup note on her vulvar irritation. Patient is a 67 year old who was seen in the office for annual exam on March 13 and a nonspecific vulvar fourchette irritation was noted and she was placed on clobetasol 0.05% to apply twice a day for one week and then the following week once before bedtime. She then returned to the office on March 27 for followup and she was instructed to continue the clobetasol 3 times a week for one month. She is tapering off her oral estrogen and we'll started on vaginal estrogen twice a week (once she finishes her oral estrogen) for vaginal atrophy.  The patient now returns for followup but she states that her vasomotor symptoms such as hot flashes her debility mood swings along with her vaginal dryness and irritation in his back and she would like to return back to her low dose Estrace 0.5 mg daily that she was on before. Patient does have prior history of hysterectomy. Her mammograms are up-to-date. No history of breast cancer reported.  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 Patient will be restarted back on her Estrace 0.5 mg 1 by mouth daily per patient's request. Patient was once again counseled as to risks benefits and pros and cons of hormone replacement therapy as well as the women's health initiative study and potential risk for long-term use of estrogen in relationship to breast cancer. Patient will will continue her estradiol vaginal cream daily for the next 2 weeks and then returned back 3 times a week. She'll return back to the office in one month for followup.

## 2013-01-23 ENCOUNTER — Telehealth: Payer: Self-pay

## 2013-01-23 MED ORDER — NONFORMULARY OR COMPOUNDED ITEM: Status: DC

## 2013-01-23 NOTE — Telephone Encounter (Signed)
[employer] called. Patient notified them that Dr. Glenetta Hew had recommended change in how she uses her Estrace Cream for the next few weeks and they are wanting new Rx that reflects those directions for pt.

## 2013-01-23 NOTE — Telephone Encounter (Signed)
Dr. Manuela Schwartz note says "Patient will will continue her estradiol vaginal cream daily for the next 2 weeks and then returned back 3 times a week. "

## 2013-02-03 ENCOUNTER — Ambulatory Visit (INDEPENDENT_AMBULATORY_CARE_PROVIDER_SITE_OTHER): Payer: Medicare Other

## 2013-02-03 DIAGNOSIS — Z23 Encounter for immunization: Secondary | ICD-10-CM

## 2013-02-19 ENCOUNTER — Encounter: Payer: Self-pay | Admitting: Gynecology

## 2013-02-19 ENCOUNTER — Ambulatory Visit (INDEPENDENT_AMBULATORY_CARE_PROVIDER_SITE_OTHER): Payer: Medicare Other | Admitting: Gynecology

## 2013-02-19 VITALS — BP 128/76

## 2013-02-19 DIAGNOSIS — N905 Atrophy of vulva: Secondary | ICD-10-CM

## 2013-02-19 NOTE — Progress Notes (Signed)
Patient presents to the office for followup from her labial irritation and agglutination. Patient was last seen in the office 01/22/2013 with note as follows:  Patient is a 67 year old who was seen in the office for annual exam on March 13 and a nonspecific vulvar fourchette irritation was noted and she was placed on clobetasol 0.05% to apply twice a day for one week and then the following week once before bedtime. She then returned to the office on March 27 for followup and she was instructed to continue the clobetasol 3 times a week for one month. She is tapering off her oral estrogen and we'll started on vaginal estrogen twice a week (once she finishes her oral estrogen) for vaginal atrophy.  The patient now returns for followup but she states that her vasomotor symptoms such as hot flashes her debility mood swings along with her vaginal dryness and irritation in his back and she would like to return back to her low dose Estrace 0.5 mg daily that she was on before. Patient does have prior history of hysterectomy. Her mammograms are up-to-date. No history of breast cancer reported  No evidence of further labial agglutination only at the fourchette this tube appears to be like a paper cut epithelium separation which hasn't healed completely and border slightly (atrophic). Patient has continued with the Estrace 0.5 mg daily by mouth and it has improved her vasomotor symptoms. She will be instructed to apply the Estrace vaginal cream every day for the next 2 months and then to return to my office for followup. She stated that which she was putting it frequently the area completely healed. At this does not respond we will need to do a biopsy of the area as well. She will do sitz baths every other night as well.

## 2013-02-23 ENCOUNTER — Telehealth: Payer: Self-pay

## 2013-02-23 MED ORDER — NONFORMULARY OR COMPOUNDED ITEM: Status: DC

## 2013-02-23 NOTE — Telephone Encounter (Signed)
Patient called as she needs Rx called in for her Estrace cream. She told Dr. Glenetta Hew on Thursday that she had some but she does not have but another days use left.  Rx to [facility].

## 2013-03-06 ENCOUNTER — Other Ambulatory Visit: Payer: Self-pay | Admitting: Internal Medicine

## 2013-04-23 ENCOUNTER — Encounter: Payer: Self-pay | Admitting: Gynecology

## 2013-04-23 ENCOUNTER — Ambulatory Visit (INDEPENDENT_AMBULATORY_CARE_PROVIDER_SITE_OTHER): Payer: Medicare Other | Admitting: Gynecology

## 2013-04-23 VITALS — BP 138/70

## 2013-04-23 DIAGNOSIS — L293 Anogenital pruritus, unspecified: Secondary | ICD-10-CM | POA: Diagnosis not present

## 2013-04-23 DIAGNOSIS — N905 Atrophy of vulva: Secondary | ICD-10-CM

## 2013-04-23 DIAGNOSIS — N898 Other specified noninflammatory disorders of vagina: Secondary | ICD-10-CM

## 2013-04-23 MED ORDER — NONFORMULARY OR COMPOUNDED ITEM: Status: DC

## 2013-04-23 NOTE — Progress Notes (Signed)
   Patient presented to the office today for followup from labial irritation and agglutination as a result of severe vaginal atrophy.Patient was last seen in the office 01/22/2013 with note as follows:  Patient is a 67 year old who was seen in the office for annual exam on March 13 and a nonspecific vulvar fourchette irritation was noted and she was placed on clobetasol 0.05% to apply twice a day for one week and then the following week once before bedtime. She then returned to the office on March 27 for followup and she was instructed to continue the clobetasol 3 times a week for one month. She is tapering off her oral estrogen and we'll started on vaginal estrogen twice a week (once she finishes her oral estrogen) for vaginal atrophy.  Patient returned to the office on 02/19/2013 for followup and stated that her vasomotor symptoms such as hot flashes her debility mood swings along with her vaginal dryness and irritation in his back and she would like to return back to her low dose Estrace 0.5 mg daily that she was on before. Patient does have prior history of hysterectomy. Her mammograms are up-to-date. No history of breast cancer reported.  When she was seen on October 9 there was no further evidence of labial agglutination only at the area of the fourchette there appeared to be a paper cut like separation which had not healed completely and the borders were slightly atrophic.Patient has continued with the Estrace 0.5 mg daily by mouth and it has improved her vasomotor symptoms. She will be instructed to apply the Estrace vaginal cream every day for 2 months and was asked to return to the office which she did today.  Patient is doing well and not complaining of of any vasomotor symptoms, vulvar pruritus or rotation. On exam nicely healed epithelium the paper cut anterior fourchette was not present. A small white epidermal inclusion cyst is present we will recommend the patient to do sitz bath every other  day for the next several months and to continue now the estrogen vaginal cream 2-3 times a week only and continue her oral Estrace. She scheduled to return back to the office in spring of next year and will reassess again. At this white area still present we will biopsy.

## 2013-05-18 ENCOUNTER — Ambulatory Visit (INDEPENDENT_AMBULATORY_CARE_PROVIDER_SITE_OTHER): Payer: Medicare Other | Admitting: Family Medicine

## 2013-05-18 ENCOUNTER — Encounter: Payer: Self-pay | Admitting: Family Medicine

## 2013-05-18 VITALS — BP 129/79 | HR 70 | Temp 97.5°F | Wt 152.5 lb

## 2013-05-18 DIAGNOSIS — R059 Cough, unspecified: Secondary | ICD-10-CM

## 2013-05-18 DIAGNOSIS — J029 Acute pharyngitis, unspecified: Secondary | ICD-10-CM | POA: Diagnosis not present

## 2013-05-18 DIAGNOSIS — R05 Cough: Secondary | ICD-10-CM | POA: Diagnosis not present

## 2013-05-18 DIAGNOSIS — J069 Acute upper respiratory infection, unspecified: Secondary | ICD-10-CM

## 2013-05-18 LAB — POCT RAPID STREP A (OFFICE): RAPID STREP A SCREEN: NEGATIVE

## 2013-05-18 MED ORDER — AZITHROMYCIN 250 MG PO TABS
ORAL_TABLET | ORAL | Status: DC
Start: 2013-05-18 — End: 2013-07-23

## 2013-05-18 NOTE — Progress Notes (Signed)
OFFICE NOTE  05/18/2013  CC:  Chief Complaint  Patient presents with  . Sore Throat     HPI: Patient is a 68 y.o. Caucasian female who is here for sore throat. Onset 5d/a ST, then yesterday she had a HA, fullness in sinuses, raspy voice, some fatigue, no weakness, no fever. Some coughing started today.  No n/v/d.   Pertinent PMH:  Past Medical History  Diagnosis Date  . HYPOTHYROIDISM 07/20/2008  . Abdominal pain, epigastric 08/22/2009  . Chronic low back pain   . GERD (gastroesophageal reflux disease)   . Diverticulosis   . Hemorrhoids   . Adenomatous colon polyp   . NSVD (normal spontaneous vaginal delivery)     X2    MEDS:  Outpatient Prescriptions Prior to Visit  Medication Sig Dispense Refill  . cycloSPORINE (RESTASIS) 0.05 % ophthalmic emulsion Place 1 drop into the left eye 2 (two) times daily.      Marland Kitchen estradiol (ESTRACE) 0.5 MG tablet Take 1 tablet (0.5 mg total) by mouth daily.  90 tablet  4  . fluticasone (FLONASE) 50 MCG/ACT nasal spray Place 2 sprays into the nose daily.  16 g  6  . furosemide (LASIX) 20 MG tablet Take by mouth as needed.       Marland Kitchen levothyroxine (SYNTHROID) 88 MCG tablet Take 1 tablet (88 mcg total) by mouth daily.  90 tablet  2  . meloxicam (MOBIC) 15 MG tablet Take 15 mg by mouth daily.        . NONFORMULARY OR COMPOUNDED ITEM Estradiol .02% 1 ML Prefilled Applicator Sig: apply vaginally twice a week #90 Day Supply with 4 refills  1 each  4  . nystatin-triamcinolone (MYCOLOG II) cream Apply twice a day for one week then the second week apply once a day  30 g  2  . omega-3 acid ethyl esters (LOVAZA) 1 G capsule Take by mouth 2 (two) times daily.      Marland Kitchen omeprazole (PRILOSEC) 20 MG capsule TAKE 1 CAPSULE DAILY  90 capsule  0  . polyethylene glycol (MIRALAX / GLYCOLAX) packet Take 17 g by mouth daily.      . Psyllium (METAMUCIL) 30.9 % POWD Take by mouth. One tsp daily at bedtime       . SYNTHROID 88 MCG tablet TAKE 1 TABLET DAILY  90 tablet  0  .  zolpidem (AMBIEN) 10 MG tablet Take 10 mg by mouth at bedtime as needed.        No facility-administered medications prior to visit.    PE: Blood pressure 129/79, pulse 70, temperature 97.5 F (36.4 C), temperature source Temporal, weight 152 lb 8 oz (69.174 kg), SpO2 97.00%. VS: noted--normal. Gen: alert, NAD, NONTOXIC APPEARING. HEENT: eyes without injection, drainage, or swelling.  Ears: EACs clear, TMs with normal light reflex and landmarks.  Nose: Clear rhinorrhea, with some dried, crusty exudate adherent to mildly injected mucosa.  No purulent d/c.  Left paranasal sinus TTP.  No facial swelling.  Throat and mouth without focal lesion.  No pharyngial swelling, erythema, or exudate.   Neck: supple, no LAD.   LUNGS: CTA bilat, nonlabored resps.   CV: RRR, no m/r/g. EXT: no c/c/e SKIN: no rash  LAB: Rapid strep negative  IMPRESSION AND PLAN:  Worsening resp infection, upper tract sx's > lower tract sx's. Azith x 5d. Robitussin DM. Rest, fluids. Sent throat culture.  FOLLOW UP: prn

## 2013-05-18 NOTE — Patient Instructions (Signed)
Robitussin DM (generic) as directed on box

## 2013-05-18 NOTE — Progress Notes (Signed)
Pre-visit discussion using our clinic review tool. No additional management support is needed unless otherwise documented below in the visit note.  

## 2013-05-19 ENCOUNTER — Other Ambulatory Visit: Payer: Self-pay | Admitting: Internal Medicine

## 2013-05-20 LAB — CULTURE, GROUP A STREP: ORGANISM ID, BACTERIA: NORMAL

## 2013-06-29 DIAGNOSIS — H16149 Punctate keratitis, unspecified eye: Secondary | ICD-10-CM | POA: Diagnosis not present

## 2013-06-29 DIAGNOSIS — Z961 Presence of intraocular lens: Secondary | ICD-10-CM | POA: Diagnosis not present

## 2013-06-29 DIAGNOSIS — H04129 Dry eye syndrome of unspecified lacrimal gland: Secondary | ICD-10-CM | POA: Diagnosis not present

## 2013-06-29 DIAGNOSIS — H18599 Other hereditary corneal dystrophies, unspecified eye: Secondary | ICD-10-CM | POA: Diagnosis not present

## 2013-07-06 DIAGNOSIS — H16149 Punctate keratitis, unspecified eye: Secondary | ICD-10-CM | POA: Diagnosis not present

## 2013-07-06 DIAGNOSIS — H18599 Other hereditary corneal dystrophies, unspecified eye: Secondary | ICD-10-CM | POA: Diagnosis not present

## 2013-07-06 DIAGNOSIS — Z961 Presence of intraocular lens: Secondary | ICD-10-CM | POA: Diagnosis not present

## 2013-07-06 DIAGNOSIS — H04129 Dry eye syndrome of unspecified lacrimal gland: Secondary | ICD-10-CM | POA: Diagnosis not present

## 2013-07-13 ENCOUNTER — Encounter: Payer: Self-pay | Admitting: Podiatry

## 2013-07-13 ENCOUNTER — Ambulatory Visit (INDEPENDENT_AMBULATORY_CARE_PROVIDER_SITE_OTHER): Payer: Medicare Other | Admitting: Podiatry

## 2013-07-13 VITALS — BP 145/74 | HR 69 | Resp 18

## 2013-07-13 DIAGNOSIS — L600 Ingrowing nail: Secondary | ICD-10-CM

## 2013-07-13 NOTE — Patient Instructions (Signed)
  Use Tylenol for pain control if needed  ANTIBACTERIAL SOAP INSTRUCTIONS  THE DAY AFTER PROCEDURE  Please follow the instructions your doctor has marked.   Shower as usual. Before getting out, place a drop of antibacterial liquid soap (Dial) on a wet, clean washcloth.  Gently wipe washcloth over affected area.  Afterward, rinse the area with warm water.  Blot the area dry with a soft cloth and cover with antibiotic ointment (neosporin, polysporin, bacitracin) and band aid or gauze and tape  Place 3-4 drops of antibacterial liquid soap in a quart of warm tap water.  Submerge foot into water for 20 minutes.  If bandage was applied after your procedure, leave on to allow for easy lift off, then remove and continue with soak for the remaining time.  Next, blot area dry with a soft cloth and cover with a bandage.  Apply other medications as directed by your doctor, such as cortisporin otic solution (eardrops) or neosporin antibiotic ointment

## 2013-07-13 NOTE — Progress Notes (Signed)
   Subjective:    [name], female    DOB: 1946/02/23, 68 y.o.   MRN: [id number]  HPI I have ingrowns on both big toes but my left big toe and has been going on for about a month and last week it has been throbbing and sore and tender and no draining and hurts with shoes and socks and If I hit it that is when it hurts.    Review of Systems  Eyes: Positive for pain.  Musculoskeletal: Positive for back pain.  Allergic/Immunologic: Positive for food allergies.  All other systems reviewed and are negative.       Objective:   Physical Exam  Orientated x15 68 year old female  Vascular: DP and PT pulses 2/4 bilaterally. Capillary fill is immediate bilaterally  Neurological: Sensation intact  Dermatological: Atrophic skin noted bilaterally. The medial margin of the left hallux toenail is incurvated, callused and tender to pressure.       Assessment & Plan:   Assessment: Ingrowing medial margin of the left hallux toenail  Plan: Offered patient treatment options including nonsurgical and permanent nail removal. Patient verbally consents to permanent nail removal.  The left hallux is then blocked with 3 cc of 50-50 mixture of 2% plain Xylocaine and 0.5% plain Marcaine. The left hallux is painted with Betadine and exsanguinated. The medial margin of the left hallux toenail was excised and a phenol matricectomy performed. The tourniquet was released and spontaneous capillary filling time noted in the left hallux. An antibiotic compression dressing applied.  Postoperative oral written instructions provided. Reappoint at patient's request

## 2013-07-20 ENCOUNTER — Ambulatory Visit: Payer: Medicare Other | Admitting: Podiatry

## 2013-07-23 ENCOUNTER — Encounter: Payer: Self-pay | Admitting: Internal Medicine

## 2013-07-23 ENCOUNTER — Ambulatory Visit (INDEPENDENT_AMBULATORY_CARE_PROVIDER_SITE_OTHER): Payer: Medicare Other | Admitting: Internal Medicine

## 2013-07-23 ENCOUNTER — Telehealth: Payer: Self-pay | Admitting: Family Medicine

## 2013-07-23 VITALS — BP 130/68 | HR 71 | Temp 97.0°F | Ht 63.5 in | Wt 150.2 lb

## 2013-07-23 DIAGNOSIS — H9190 Unspecified hearing loss, unspecified ear: Secondary | ICD-10-CM | POA: Diagnosis not present

## 2013-07-23 DIAGNOSIS — J069 Acute upper respiratory infection, unspecified: Secondary | ICD-10-CM | POA: Insufficient documentation

## 2013-07-23 DIAGNOSIS — R42 Dizziness and giddiness: Secondary | ICD-10-CM

## 2013-07-23 DIAGNOSIS — H9192 Unspecified hearing loss, left ear: Secondary | ICD-10-CM | POA: Insufficient documentation

## 2013-07-23 MED ORDER — LEVOFLOXACIN 250 MG PO TABS: 250.0000 mg | ORAL_TABLET | Freq: Every day | ORAL | Status: DC

## 2013-07-23 MED ORDER — MECLIZINE HCL 12.5 MG PO TABS: 12.5000 mg | ORAL_TABLET | Freq: Three times a day (TID) | ORAL | Status: DC | PRN

## 2013-07-23 NOTE — Telephone Encounter (Signed)
Patient Information:  Caller Name: Cassidy Norton  Phone: [phone]  Patient: Cassidy Norton  Gender: Female  DOB: 09-08-1945  Age: 68 Years  PCP: Carolann Littler [facility])  Office Follow Up:  Does the office need to follow up with this patient?: No  Instructions For The Office: N/A  RN Note:  Mild shortness of breath present.  Moving head makes symptoms worse. Denies nasal or head congestion.  Ate breakfast and lunch. Had approximately 50 oz fluids so far. No appointments at [facility] or [place] offices.  Scheduled at [facility] office at 1745 07/23/13 with Dr Jenny Reichmann.    Symptoms  Reason For Call & Symptoms: Dizzy/lightheaded, weakness on her feet, nausea, and chills.  BP 142/62 at [facility].  Reviewed Health History In EMR: Yes  Reviewed Medications In EMR: Yes  Reviewed Allergies In EMR: Yes  Reviewed Surgeries / Procedures: Yes  Date of Onset of Symptoms: 07/23/2013  Treatments Tried: Resting, > water intake  Treatments Tried Worked: No  Guideline(s) Used:  Dizziness  Disposition Per Guideline:   Go to Office Now  Reason For Disposition Reached:   Lightheadedness (dizziness) present now, after 2 hours of rest and fluids  Advice Given:  Temporary Dizziness  is usually a harmless symptom. It can be caused by not drinking enough water during sports or hot weather. It can also be caused by skipping a meal, too much sun exposure, standing up suddenly, standing too long in one place or even a viral illness.  Call Back If:  Passes out (faints)  You become worse.  Patient Will Follow Care Advice:  YES  Appointment Scheduled:  07/23/2013 17:45:00 Appointment Scheduled Provider:  Other

## 2013-07-23 NOTE — Patient Instructions (Signed)
Your left ear was irrigated today  Please take all new medication as prescribed- the antibiotic, as well as the medication for dizziness if needed  You can also take Mucinex (or it's generic off brand) for congestion, and tylenol as needed for pain.  Please continue all other medications as before, and refills have been done if requested. Please have the pharmacy call with any other refills you may need.

## 2013-07-23 NOTE — Progress Notes (Signed)
Pre visit review using our clinic review tool, if applicable. No additional management support is needed unless otherwise documented below in the visit note. 

## 2013-07-23 NOTE — Assessment & Plan Note (Signed)
Mild to mod, for antibx course,  to f/u any worsening symptoms or concerns 

## 2013-07-23 NOTE — Progress Notes (Signed)
Subjective:    [name], female    DOB: 1945-12-01, 68 y.o.   MRN: [id number]  HPI   Here with 2-3 days acute onset fever, facial pain, pressure, headache, general weakness and malaise, and greenish d/c, with mild ST and cough, but pt denies chest pain, wheezing, increased sob or doe, orthopnea, PND, increased LE swelling, palpitations, dizziness or syncope.  Has had bilat ear pressure, hearing decreased on left only, and recurrent episodes vertigo sensation with nausea, no vomiting with head position change Past Medical History  Diagnosis Date  . HYPOTHYROIDISM 07/20/2008  . Abdominal pain, epigastric 08/22/2009  . Chronic low back pain   . GERD (gastroesophageal reflux disease)   . Diverticulosis   . Hemorrhoids   . Adenomatous colon polyp   . NSVD (normal spontaneous vaginal delivery)     X2   Past Surgical History  Procedure Laterality Date  . Cholecystectomy    . Blepharoplasty    . Dilation and curettage of uterus    . Abdominal hysterectomy      TVH  . Eye surgery  2008    BILATERAL CATARACT   . Back surgery      RUPTURED DISC  . Colonoscopy  JAN 2014    NEXT COLONOSCOPY IN 10 YEARS    reports that she quit smoking about 9 years ago. Her smoking use included Cigarettes. She has a 60 pack-year smoking history. She has never used smokeless tobacco. She reports that she does not drink alcohol or use illicit drugs. family history includes Cancer in her father and mother; Diabetes in her brother, mother, and sister; Heart disease in her brother, daughter, and father; Hypertension in her brother and sister. There is no history of Stomach cancer. Allergies  Allergen Reactions  . Aspirin     REACTION: Hives   Current Outpatient Prescriptions on File Prior to Visit  Medication Sig Dispense Refill  . cycloSPORINE (RESTASIS) 0.05 % ophthalmic emulsion Place 1 drop into the left eye 2 (two) times daily.      Marland Kitchen estradiol (ESTRACE) 0.5 MG tablet Take 1 tablet (0.5  mg total) by mouth daily.  90 tablet  4  . furosemide (LASIX) 20 MG tablet Take by mouth as needed.       Marland Kitchen levothyroxine (SYNTHROID) 88 MCG tablet Take 1 tablet (88 mcg total) by mouth daily.  90 tablet  2  . meloxicam (MOBIC) 15 MG tablet Take 15 mg by mouth daily.        . NONFORMULARY OR COMPOUNDED ITEM Estradiol .02% 1 ML Prefilled Applicator Sig: apply vaginally twice a week #90 Day Supply with 4 refills  1 each  4  . omega-3 acid ethyl esters (LOVAZA) 1 G capsule Take by mouth 2 (two) times daily.      . Omega-3 Fatty Acids (FISH OIL PO) Take by mouth 4 (four) times daily.      Marland Kitchen omeprazole (PRILOSEC) 20 MG capsule TAKE 1 CAPSULE DAILY  90 capsule  0  . polyethylene glycol (MIRALAX / GLYCOLAX) packet Take 17 g by mouth daily.      . Psyllium (METAMUCIL) 30.9 % POWD Take by mouth. One tsp daily at bedtime       . SYNTHROID 88 MCG tablet TAKE 1 TABLET DAILY  90 tablet  0  . zolpidem (AMBIEN) 10 MG tablet Take 10 mg by mouth at bedtime as needed.        No current facility-administered medications on file prior  to visit.   Review of Systems  Constitutional: Negative for unexpected weight change, or unusual diaphoresis  HENT: Negative for tinnitus.   Eyes: Negative for photophobia and visual disturbance.  Respiratory: Negative for choking and stridor.   Gastrointestinal: Negative for vomiting and blood in stool.  Genitourinary: Negative for hematuria and decreased urine volume.  Musculoskeletal: Negative for acute joint swelling Skin: Negative for color change and wound.  Neurological: Negative for tremors and numbness other than noted  Psychiatric/Behavioral: Negative for decreased concentration or  hyperactivity.       Objective:   Physical Exam BP 130/68  Pulse 71  Temp(Src) 97 F (36.1 C) (Oral)  Ht 5' 3.5" (1.613 m)  Wt 150 lb 4 oz (68.153 kg)  BMI 26.19 kg/m2  SpO2 94% VS noted, mild ill Constitutional: Pt appears well-developed and well-nourished.  HENT: Head:  NCAT.  Right Ear: External ear normal.  Left Ear: External ear normal.  Bilat tm's with mild erythema.  Max sinus areas mild tender.  Pharynx with mild erythema, no exudate Left canal wax impaction irrigated, resolved Eyes: Conjunctivae and EOM are normal. Pupils are equal, round, and reactive to light.  Neck: Normal range of motion. Neck supple.  Cardiovascular: Normal rate and regular rhythm.   Pulmonary/Chest: Effort normal and breath sounds normal.  Abd:  Soft, NT, non-distended, + BS Neurological: Pt is alert. Not confused , motor grossly intact, FTN intact Skin: Skin is warm. No erythema.  Psychiatric: Pt behavior is normal. Thought content normal.         Assessment & Plan:

## 2013-07-23 NOTE — Assessment & Plan Note (Signed)
Improved s/p left irrigation

## 2013-07-23 NOTE — Assessment & Plan Note (Signed)
Likely due to middle ear involvement, for meclizine prn,  to f/u any worsening symptoms or concerns

## 2013-07-27 ENCOUNTER — Encounter: Payer: Self-pay | Admitting: Internal Medicine

## 2013-07-27 ENCOUNTER — Telehealth: Payer: Self-pay | Admitting: Internal Medicine

## 2013-07-27 ENCOUNTER — Ambulatory Visit (INDEPENDENT_AMBULATORY_CARE_PROVIDER_SITE_OTHER): Payer: Medicare Other | Admitting: Internal Medicine

## 2013-07-27 VITALS — BP 116/66 | HR 75 | Temp 97.7°F | Ht 63.5 in | Wt 151.0 lb

## 2013-07-27 DIAGNOSIS — J029 Acute pharyngitis, unspecified: Secondary | ICD-10-CM | POA: Insufficient documentation

## 2013-07-27 DIAGNOSIS — Z791 Long term (current) use of non-steroidal anti-inflammatories (NSAID): Secondary | ICD-10-CM

## 2013-07-27 DIAGNOSIS — R42 Dizziness and giddiness: Secondary | ICD-10-CM

## 2013-07-27 DIAGNOSIS — E039 Hypothyroidism, unspecified: Secondary | ICD-10-CM | POA: Diagnosis not present

## 2013-07-27 LAB — CBC WITH DIFFERENTIAL/PLATELET
BASOS ABS: 0 10*3/uL (ref 0.0–0.1)
BASOS PCT: 0.3 % (ref 0.0–3.0)
Eosinophils Absolute: 0.3 10*3/uL (ref 0.0–0.7)
Eosinophils Relative: 3.1 % (ref 0.0–5.0)
HEMATOCRIT: 42.7 % (ref 36.0–46.0)
HEMOGLOBIN: 14.4 g/dL (ref 12.0–15.0)
LYMPHS ABS: 2.9 10*3/uL (ref 0.7–4.0)
Lymphocytes Relative: 33.4 % (ref 12.0–46.0)
MCHC: 33.7 g/dL (ref 30.0–36.0)
MCV: 92.4 fl (ref 78.0–100.0)
MONOS PCT: 7.5 % (ref 3.0–12.0)
Monocytes Absolute: 0.7 10*3/uL (ref 0.1–1.0)
NEUTROS ABS: 4.9 10*3/uL (ref 1.4–7.7)
Neutrophils Relative %: 55.7 % (ref 43.0–77.0)
Platelets: 243 10*3/uL (ref 150.0–400.0)
RBC: 4.61 Mil/uL (ref 3.87–5.11)
RDW: 13.5 % (ref 11.5–14.6)
WBC: 8.7 10*3/uL (ref 4.5–10.5)

## 2013-07-27 LAB — BASIC METABOLIC PANEL
BUN: 12 mg/dL (ref 6–23)
CO2: 28 mEq/L (ref 19–32)
Calcium: 8.9 mg/dL (ref 8.4–10.5)
Chloride: 107 mEq/L (ref 96–112)
Creatinine, Ser: 0.7 mg/dL (ref 0.4–1.2)
GFR: 95.79 mL/min (ref >60.00)
Glucose, Bld: 107 mg/dL — ABNORMAL HIGH (ref 70–99)
POTASSIUM: 4 meq/L (ref 3.5–5.1)
SODIUM: 140 meq/L (ref 135–145)

## 2013-07-27 LAB — HEPATIC FUNCTION PANEL
ALK PHOS: 80 U/L (ref 39–117)
ALT: 13 U/L (ref 0–35)
AST: 17 U/L (ref 0–37)
Albumin: 3.5 g/dL (ref 3.5–5.2)
Bilirubin, Direct: 0 mg/dL (ref 0.0–0.3)
TOTAL PROTEIN: 6.5 g/dL (ref 6.0–8.3)
Total Bilirubin: 0.5 mg/dL (ref 0.3–1.2)

## 2013-07-27 LAB — LIPID PANEL
Cholesterol: 147 mg/dL (ref 0–200)
HDL: 35 mg/dL — AB (ref >39.00)
LDL Cholesterol: 79 mg/dL (ref 0–99)
Total CHOL/HDL Ratio: 4
Triglycerides: 165 mg/dL — ABNORMAL HIGH (ref 0.0–149.0)
VLDL: 33 mg/dL (ref 0.0–40.0)

## 2013-07-27 LAB — SEDIMENTATION RATE: SED RATE: 15 mm/h (ref 0–22)

## 2013-07-27 LAB — TSH: TSH: 0.79 u[IU]/mL (ref 0.35–5.50)

## 2013-07-27 NOTE — Progress Notes (Signed)
Chief Complaint  Patient presents with  . Sore Throat    Ongoing.  Dizziness is worse today along with her throat.  . Dizziness    HPI: Patient comes in today for SDA  Doc of day apppt  problem evaluation.  Saw JJ ;last week for uroi and given levaquin not better so given appt today   Had dx vertigo  Throat was red and had wax in ear. Removed  nochange in sx  Throat some worse   Had bvben on antibiotic and worse since then.  Feels unstable and head going around and throat is raw  And hoarse into trachea.    Lemon drop feels burning  Down throat.   Ok if sitting down but otherwise she just feels off like to see and little sick to her stomach but no active vomiting no hearing loss chest pain shortness of breath she is due for blood test monitoring.  He does take Mobic every day for her joint problems and her continued thyroid medicine but only rarely of use of a diuretic.  PCP is Dr.  Elease Hashimoto   ROS: See pertinent positives and negatives per HPI. No focal weakness or vision changes. Parent he had an episode of thrush at some point and was told she could have an autoimmune problem. Is not taking the Ambien recently.  Past Medical History  Diagnosis Date  . HYPOTHYROIDISM 07/20/2008  . Abdominal pain, epigastric 08/22/2009  . Chronic low back pain   . GERD (gastroesophageal reflux disease)   . Diverticulosis   . Hemorrhoids   . Adenomatous colon polyp   . NSVD (normal spontaneous vaginal delivery)     X2    Family History  Problem Relation Age of Onset  . Cancer Mother     bladder CA  . Diabetes Mother   . Cancer Father     gallblader CA  . Heart disease Father   . Diabetes Sister   . Hypertension Sister   . Diabetes Brother   . Hypertension Brother   . Heart disease Brother   . Heart disease Daughter   . Stomach cancer Neg Hx     History   Social History  . Marital Status: Married    Spouse Name: N/A    Number of Children: 1  . Years of Education: N/A    Occupational History  . Retired    Social History Main Topics  . Smoking status: Former Smoker -- 1.50 packs/day for 40 years    Types: Cigarettes    Quit date: 08/28/2003  . Smokeless tobacco: Never Used  . Alcohol Use: No  . Drug Use: No  . Sexual Activity: Yes    Birth Control/ Protection: Surgical     Comment: HYSTERECTOMY   Other Topics Concern  . None   Social History Narrative  . None    Outpatient Encounter Prescriptions as of 07/27/2013  Medication Sig  . cycloSPORINE (RESTASIS) 0.05 % ophthalmic emulsion Place 1 drop into the left eye 2 (two) times daily.  Marland Kitchen estradiol (ESTRACE) 0.5 MG tablet Take 1 tablet (0.5 mg total) by mouth daily.  . furosemide (LASIX) 20 MG tablet Take by mouth as needed.   Marland Kitchen levofloxacin (LEVAQUIN) 250 MG tablet Take 1 tablet (250 mg total) by mouth daily.  Marland Kitchen levothyroxine (SYNTHROID) 88 MCG tablet Take 1 tablet (88 mcg total) by mouth daily.  . meclizine (ANTIVERT) 12.5 MG tablet Take 1 tablet (12.5 mg total) by mouth 3 (three) times daily  as needed for dizziness.  . meloxicam (MOBIC) 15 MG tablet Take 15 mg by mouth daily.    . NONFORMULARY OR COMPOUNDED ITEM Estradiol .02% 1 ML Prefilled Applicator Sig: apply vaginally twice a week #90 Day Supply with 4 refills  . omega-3 acid ethyl esters (LOVAZA) 1 G capsule Take by mouth 2 (two) times daily.  . Omega-3 Fatty Acids (FISH OIL PO) Take by mouth 4 (four) times daily.  Marland Kitchen omeprazole (PRILOSEC) 20 MG capsule TAKE 1 CAPSULE DAILY  . polyethylene glycol (MIRALAX / GLYCOLAX) packet Take 17 g by mouth daily.  . Psyllium (METAMUCIL) 30.9 % POWD Take by mouth. One tsp daily at bedtime   . SYNTHROID 88 MCG tablet TAKE 1 TABLET DAILY  . zolpidem (AMBIEN) 10 MG tablet Take 10 mg by mouth at bedtime as needed.     EXAM:  BP 116/66  Pulse 75  Temp(Src) 97.7 F (36.5 C) (Oral)  Ht 5' 3.5" (1.613 m)  Wt 151 lb (68.493 kg)  BMI 26.33 kg/m2  SpO2 97%  Body mass index is 26.33  kg/(m^2).  GENERAL: vitals reviewed and listed above, alert, oriented, appears well hydrated and in no acute distress looks mildly congested HEENT: atraumatic, conjunctiva  clear, no obvious abnormalities on inspection of external nose and ears TMs intact nares minimal congestion face nontender OP : no lesion edema or exudate slight redness and cobblestoning no lesion NECK: no obvious masses on inspection palpation no JVD or adenopathy LUNGS: clear to auscultation bilaterally, no wheezes, rales or rhonchi, good air movement CV: HRRR, no clubbing cyanosis or  peripheral edema nl cap refill  MS: moves all extremities without noticeable focal  Abnormality Neurologic cranial nerves III through XII appearing fact gait is within normal when limits but she is a bit cautious no cogwheeling Romberg negative feels like she's going to fall forward. PSYCH: pleasant and cooperative, no obvious depression or anxiety ASSESSMENT AND PLAN:  Discussed the following assessment and plan:  Vertigo dizziness - Uncertain cause possibly related to a viral respiratory infection no need to continue the Levaquin at this time - Plan: Basic metabolic panel, CBC with Differential, Hepatic function panel, TSH, Lipid panel, Sedimentation rate  HYPOTHYROIDISM - Due for monitoring check today - Plan: Basic metabolic panel, CBC with Differential, Hepatic function panel, TSH, Lipid panel, Sedimentation rate  Sore throat - Viral versus other symptomatic treatment follow, parent history of thrush no dysphasia otherwise - Plan: Basic metabolic panel, CBC with Differential, Hepatic function panel, TSH, Lipid panel, Sedimentation rate Plan laboratory evaluation today and followup with her PCP uncertain cause of her symptoms but I doubt it is a bacterial infection at this time. Fortunately no alarm features on her exam. -Patient advised to return or notify health care team  if symptoms worsen ,persist or new concerns arise.  Patient  Instructions  Ok to stop the levaquin after the 5 days .  antivert ok bu t will not cure the problem .  Will notify you  of labs when available. Make appt with Dfr Burchette for    ABOUT 10 DAYS FROM NOW OR AS NEEDED    Juston Goheen K. Brighten Orndoff M.D.  Pre visit review using our clinic review tool, if applicable. No additional management support is needed unless otherwise documented below in the visit note. Total visit 67mins > 50% spent counseling and coordinating care

## 2013-07-27 NOTE — Telephone Encounter (Signed)
Patient Information:  Caller Name: Cassidy Norton  Phone: [phone]  Patient: Cassidy Norton, Cassidy Norton  Gender: Female  DOB: 1946-03-02  Age: 68 Years  PCP: Carolann Littler (Family Practice)  Office Follow Up:  Does the office need to follow up with this patient?: No  Instructions For The Office: N/A   Symptoms  Reason For Call & Symptoms: Pt calling regarding sore throat. Started on antibx on 07/23/13 but feels worse today than 07/23/13, after taking Levoquin for 3/5 days. Prefers another appt.  Reviewed Health History In EMR: Yes  Reviewed Medications In EMR: Yes  Reviewed Allergies In EMR: Yes  Reviewed Surgeries / Procedures: Yes  Date of Onset of Symptoms: 07/23/2013  Treatments Tried: Levoquin  Treatments Tried Worked: No  Guideline(s) Used:  Sore Throat  Disposition Per Guideline:   See Today in Office  Reason For Disposition Reached:   Severe sore throat pain  Advice Given:  N/A  Patient Will Follow Care Advice:  YES  Appointment Scheduled:  07/27/2013 14:30:00 Appointment Scheduled Provider:  Shanon Ace (Family Practice)

## 2013-07-27 NOTE — Patient Instructions (Signed)
Ok to stop the levaquin after the 5 days .  antivert ok bu t will not cure the problem .  Will notify you  of labs when available. Make appt with Dfr Burchette for    ABOUT 10 DAYS FROM NOW OR AS NEEDED

## 2013-07-27 NOTE — Telephone Encounter (Signed)
Patient seen Dr. Jenny Reichmann on Thursday.  She was told she had vertigo.  She would like to know how long she could expect to have it.

## 2013-07-30 ENCOUNTER — Telehealth: Payer: Self-pay | Admitting: *Deleted

## 2013-07-30 ENCOUNTER — Ambulatory Visit (INDEPENDENT_AMBULATORY_CARE_PROVIDER_SITE_OTHER): Payer: Medicare Other | Admitting: Gynecology

## 2013-07-30 ENCOUNTER — Encounter: Payer: Self-pay | Admitting: Gynecology

## 2013-07-30 VITALS — BP 122/70 | Ht 63.0 in | Wt 152.0 lb

## 2013-07-30 DIAGNOSIS — N9089 Other specified noninflammatory disorders of vulva and perineum: Secondary | ICD-10-CM | POA: Diagnosis not present

## 2013-07-30 DIAGNOSIS — B373 Candidiasis of vulva and vagina: Secondary | ICD-10-CM

## 2013-07-30 DIAGNOSIS — Z79890 Hormone replacement therapy (postmenopausal): Secondary | ICD-10-CM

## 2013-07-30 DIAGNOSIS — IMO0001 Reserved for inherently not codable concepts without codable children: Secondary | ICD-10-CM

## 2013-07-30 DIAGNOSIS — N949 Unspecified condition associated with female genital organs and menstrual cycle: Secondary | ICD-10-CM

## 2013-07-30 DIAGNOSIS — R350 Frequency of micturition: Secondary | ICD-10-CM | POA: Diagnosis not present

## 2013-07-30 DIAGNOSIS — N952 Postmenopausal atrophic vaginitis: Secondary | ICD-10-CM | POA: Diagnosis not present

## 2013-07-30 DIAGNOSIS — R102 Pelvic and perineal pain: Secondary | ICD-10-CM

## 2013-07-30 DIAGNOSIS — B3731 Acute candidiasis of vulva and vagina: Secondary | ICD-10-CM

## 2013-07-30 LAB — URINALYSIS W MICROSCOPIC + REFLEX CULTURE
Bacteria, UA: NONE SEEN
Bilirubin Urine: NEGATIVE
CASTS: NONE SEEN
CRYSTALS: NONE SEEN
Glucose, UA: NEGATIVE mg/dL
Ketones, ur: NEGATIVE mg/dL
LEUKOCYTES UA: NEGATIVE
NITRITE: NEGATIVE
PH: 6.5 (ref 5.0–8.0)
Protein, ur: NEGATIVE mg/dL
SPECIFIC GRAVITY, URINE: 1.01 (ref 1.005–1.030)
UROBILINOGEN UA: 0.2 mg/dL (ref 0.0–1.0)
WBC, UA: NONE SEEN WBC/hpf (ref <3)

## 2013-07-30 LAB — WET PREP FOR TRICH, YEAST, CLUE
CLUE CELLS WET PREP: NONE SEEN
Trich, Wet Prep: NONE SEEN

## 2013-07-30 MED ORDER — NONFORMULARY OR COMPOUNDED ITEM: Status: DC

## 2013-07-30 MED ORDER — FLUCONAZOLE 150 MG PO TABS: 150.0000 mg | ORAL_TABLET | Freq: Once | ORAL | Status: DC

## 2013-07-30 NOTE — Telephone Encounter (Signed)
Pt rx for Dilfucan 150 mg was sent to mail order pharmacy not local pharmacy. I will send to correct pharmacy.

## 2013-07-30 NOTE — Patient Instructions (Addendum)
Shingles Vaccine What You Need to Know WHAT IS SHINGLES?  Shingles is a painful skin rash, often with blisters. It is also called Herpes Zoster or just Zoster.  A shingles rash usually appears on one side of the face or body and lasts from 2 to 4 weeks. Its main symptom is pain, which can be quite severe. Other symptoms of shingles can include fever, headache, chills, and upset stomach. Very rarely, a shingles infection can lead to pneumonia, hearing problems, blindness, brain inflammation (encephalitis), or death.  For about 1 person in 5, severe pain can continue even after the rash clears up. This is called post-herpetic neuralgia.  Shingles is caused by the Varicella Zoster virus. This is the same virus that causes chickenpox. Only someone who has had a case of chickenpox or rarely, has gotten chickenpox vaccine, can get shingles. The virus stays in your body. It can reappear many years later to cause a case of shingles.  You cannot catch shingles from another person with shingles. However, a person who has never had chickenpox (or chickenpox vaccine) could get chickenpox from someone with shingles. This is not very common.  Shingles is far more common in people 50 and older than in younger people. It is also more common in people whose immune systems are weakened because of a disease such as cancer or drugs such as steroids or chemotherapy.  At least 1 million people get shingles per year in the [place]. SHINGLES VACCINE  A vaccine for shingles was licensed in 2006. In clinical trials, the vaccine reduced the risk of shingles by 50%. It can also reduce the pain in people who still get shingles after being vaccinated.  A single dose of shingles vaccine is recommended for adults 60 years of age and older. SOME PEOPLE SHOULD NOT GET SHINGLES VACCINE OR SHOULD WAIT A person should not get shingles vaccine if he or she:  Has ever had a life-threatening allergic reaction to gelatin, the  antibiotic neomycin, or any other component of shingles vaccine. Tell your caregiver if you have any severe allergies.  Has a weakened immune system because of current:  AIDS or another disease that affects the immune system.  Treatment with drugs that affect the immune system, such as prolonged use of high-dose steroids.  Cancer treatment, such as radiation or chemotherapy.  Cancer affecting the bone marrow or lymphatic system, such as leukemia or lymphoma.  Is pregnant, or might be pregnant. Women should not become pregnant until at least 4 weeks after getting shingles vaccine. Someone with a minor illness, such as a cold, may be vaccinated. Anyone with a moderate or severe acute illness should usually wait until he or she recovers before getting the vaccine. This includes anyone with a temperature of 101.3 F (38 C) or higher. WHAT ARE THE RISKS FROM SHINGLES VACCINE?  A vaccine, like any medicine, could possibly cause serious problems, such as severe allergic reactions. However, the risk of a vaccine causing serious harm, or death, is extremely small.  No serious problems have been identified with shingles vaccine. Mild Problems  Redness, soreness, swelling, or itching at the site of the injection (about 1 person in 3).  Headache (about 1 person in 70). Like all vaccines, shingles vaccine is being closely monitored for unusual or severe problems. WHAT IF THERE IS A MODERATE OR SEVERE REACTION? What should I look for? Any unusual condition, such as a severe allergic reaction or a high fever. If a severe allergic reaction   occurred, it would be within a few minutes to an hour after the shot. Signs of a serious allergic reaction can include difficulty breathing, weakness, hoarseness or wheezing, a fast heartbeat, hives, dizziness, paleness, or swelling of the throat. What should I do?  Call your caregiver, or get the person to a caregiver right away.  Tell the caregiver what  happened, the date and time it happened, and when the vaccination was given.  Ask the caregiver to report the reaction by filing a Vaccine Adverse Event Reporting System (VAERS) form. Or, you can file this report through the VAERS web site at www.vaers.SamedayNews.es or by calling 805-644-3960. VAERS does not provide medical advice. HOW CAN I LEARN MORE?  Ask your caregiver. He or she can give you the vaccine package insert or suggest other sources of information.  Contact the Centers for Disease Control and Prevention (CDC):  Call 587-439-4692 (1-800-CDC-INFO).  Visit the CDC website at http://hunter.com/ CDC Shingles Vaccine VIS (02/17/08) Document Released: 02/25/2006 Document Revised: 07/23/2011 Document Reviewed: 08/20/2012 [facility] Patient Information 2014 [place].  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 Continue Estrace .5mg  orally daily 2. Estradiol vaginal cream continue to apply inside twice a week as before  3. For the next two weeks apply the estradiol cream as well external where the lips are stuck together
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
## 2013-08-03 ENCOUNTER — Telehealth: Payer: Self-pay | Admitting: Family Medicine

## 2013-08-03 DIAGNOSIS — Z1231 Encounter for screening mammogram for malignant neoplasm of breast: Secondary | ICD-10-CM | POA: Diagnosis not present

## 2013-08-03 NOTE — Telephone Encounter (Signed)
Pt notified of lab results

## 2013-08-03 NOTE — Telephone Encounter (Signed)
pls call pt on her home number, not cell no. about labs

## 2013-08-04 ENCOUNTER — Encounter: Payer: Self-pay | Admitting: Gynecology

## 2013-08-04 DIAGNOSIS — H524 Presbyopia: Secondary | ICD-10-CM | POA: Diagnosis not present

## 2013-08-04 DIAGNOSIS — Z961 Presence of intraocular lens: Secondary | ICD-10-CM | POA: Diagnosis not present

## 2013-08-04 DIAGNOSIS — H35039 Hypertensive retinopathy, unspecified eye: Secondary | ICD-10-CM | POA: Diagnosis not present

## 2013-08-04 DIAGNOSIS — H04129 Dry eye syndrome of unspecified lacrimal gland: Secondary | ICD-10-CM | POA: Diagnosis not present

## 2013-08-31 ENCOUNTER — Other Ambulatory Visit: Payer: Self-pay | Admitting: Family Medicine

## 2013-08-31 ENCOUNTER — Other Ambulatory Visit: Payer: Self-pay | Admitting: Internal Medicine

## 2013-09-21 ENCOUNTER — Ambulatory Visit (INDEPENDENT_AMBULATORY_CARE_PROVIDER_SITE_OTHER): Payer: Medicare Other | Admitting: Family Medicine

## 2013-09-21 ENCOUNTER — Encounter: Payer: Self-pay | Admitting: Family Medicine

## 2013-09-21 VITALS — BP 128/76 | HR 71 | Temp 97.8°F | Wt 150.0 lb

## 2013-09-21 DIAGNOSIS — K117 Disturbances of salivary secretion: Secondary | ICD-10-CM

## 2013-09-21 DIAGNOSIS — E039 Hypothyroidism, unspecified: Secondary | ICD-10-CM | POA: Diagnosis not present

## 2013-09-21 DIAGNOSIS — Z23 Encounter for immunization: Secondary | ICD-10-CM | POA: Diagnosis not present

## 2013-09-21 DIAGNOSIS — R682 Dry mouth, unspecified: Secondary | ICD-10-CM

## 2013-09-21 MED ORDER — LEVOTHYROXINE SODIUM 88 MCG PO TABS: 88.0000 ug | ORAL_TABLET | Freq: Every day | ORAL | Status: DC

## 2013-09-21 NOTE — Patient Instructions (Signed)

## 2013-09-21 NOTE — Progress Notes (Signed)
Pre visit review using our clinic review tool, if applicable. No additional management support is needed unless otherwise documented below in the visit note. 

## 2013-09-21 NOTE — Progress Notes (Signed)
Subjective:    [name], female    DOB: 1946-03-15, 68 y.o.   MRN: [id number]  HPI Comments: Patient is a 68 year old female presenting for a Synthroid refill. Her TSH was checked 2 months ago at 0.79. Patient denies recent dizziness, chest pain, palpitations, shortness of breath, abdominal pain.  Oct 2013 had steroid shot in back and was under anesthesia. Developed thrush. Since then has had a dry mouth and dry throat down into chest cavity. Denies cough, hemoptysis, heartburn, difficulty swallowing. Walking on the treadmill doesn't bother her as much as walking on an incline. She will get somewhat short of breath and feel like she needs to drink water because her throat is so dry. Patient states she will wake up in the middle of the night feeling very dry and will have to drink water. She denies polyuria or incontinence. She reports an increased appetite for the past 6 months - 1year. Would like information about an appetite suppressant. No recent change in medications. Last blood glucose was high 2 months ago at 107, was not fasting. No recent A1c. Last eye exam was 2 months ago.   Has had shingles in the past. Curious to know if medicare covers the shingles vaccine.     Past Medical History  Diagnosis Date  . HYPOTHYROIDISM 07/20/2008  . Abdominal pain, epigastric 08/22/2009  . Chronic low back pain   . GERD (gastroesophageal reflux disease)   . Diverticulosis   . Hemorrhoids   . Adenomatous colon polyp   . NSVD (normal spontaneous vaginal delivery)     X2   Past Surgical History  Procedure Laterality Date  . Cholecystectomy    . Blepharoplasty    . Dilation and curettage of uterus    . Abdominal hysterectomy      TVH  . Eye surgery  2008    BILATERAL CATARACT   . Back surgery      RUPTURED DISC  . Colonoscopy  JAN 2014    NEXT COLONOSCOPY IN 10 YEARS    Review of Systems  Constitutional: Negative for fever and activity change.  Respiratory: Negative for  cough.   Cardiovascular: Negative for chest pain and palpitations.  Gastrointestinal: Negative for abdominal pain, diarrhea and constipation.  Endocrine: Positive for polydipsia and polyphagia. Negative for polyuria.  Genitourinary: Negative for difficulty urinating.  Neurological: Negative for dizziness, syncope and numbness.      Objective:   Physical Exam  Constitutional: She appears well-developed and well-nourished.  HENT:  Head: Normocephalic.  Mouth/Throat: Uvula is midline, oropharynx is clear and moist and mucous membranes are normal.  Eyes: Pupils are equal, round, and reactive to light.  Cardiovascular: Normal rate, normal heart sounds and intact distal pulses.   Pulses:      Radial pulses are 2+ on the right side, and 2+ on the left side.       Dorsalis pedis pulses are 2+ on the right side, and 2+ on the left side.       Posterior tibial pulses are 2+ on the right side, and 2+ on the left side.  Pulmonary/Chest: Effort normal and breath sounds normal.  Neurological: She is alert. No sensory deficit.  Skin: Skin is warm and dry.      Assessment & Plan:  1. Hypothyroidism Renew synthroid prescription.  2. Health Maintenance Encouraged shingles vaccine, patient will check with insurance. Patient given Prevnar 13. Will consider ordering fasting blood sugar in the future if symptoms persist.  [place], PA-S  As above.  Occasional dry mouth.  No anticholinergic meds.  Recent NON-FASTING glucose 107 and no hx of diabetes.  We discussed other possible etiologies such as Sjogren's.  No dry eye symptoms.  She elected against any further testing at this time but recommend fasting glucose at follow up.  Carolann Littler, MD

## 2013-10-12 ENCOUNTER — Encounter: Payer: Self-pay | Admitting: Family Medicine

## 2013-10-12 ENCOUNTER — Ambulatory Visit (INDEPENDENT_AMBULATORY_CARE_PROVIDER_SITE_OTHER): Payer: Medicare Other | Admitting: Family Medicine

## 2013-10-12 VITALS — BP 118/70 | HR 71 | Temp 97.8°F | Ht 63.0 in | Wt 148.0 lb

## 2013-10-12 DIAGNOSIS — J3489 Other specified disorders of nose and nasal sinuses: Secondary | ICD-10-CM

## 2013-10-12 MED ORDER — MUPIROCIN 2 % EX OINT: 1.0000 "application " | TOPICAL_OINTMENT | Freq: Two times a day (BID) | CUTANEOUS | Status: DC

## 2013-10-12 NOTE — Progress Notes (Signed)
Pre visit review using our clinic review tool, if applicable. No additional management support is needed unless otherwise documented below in the visit note. 

## 2013-10-12 NOTE — Progress Notes (Signed)
No chief complaint on file.   HPI:  Acute visit for:  Sore in nose: -started two weeks ago -mildly sore in nose -no bleeding, swelling, fevers, malaise or discharge from nose -wants to see ENT  ROS: See pertinent positives and negatives per HPI.  Past Medical History  Diagnosis Date  . HYPOTHYROIDISM 07/20/2008  . Abdominal pain, epigastric 08/22/2009  . Chronic low back pain   . GERD (gastroesophageal reflux disease)   . Diverticulosis   . Hemorrhoids   . Adenomatous colon polyp   . NSVD (normal spontaneous vaginal delivery)     X2    Past Surgical History  Procedure Laterality Date  . Cholecystectomy    . Blepharoplasty    . Dilation and curettage of uterus    . Abdominal hysterectomy      TVH  . Eye surgery  2008    BILATERAL CATARACT   . Back surgery      RUPTURED DISC  . Colonoscopy  JAN 2014    NEXT COLONOSCOPY IN 10 YEARS    Family History  Problem Relation Age of Onset  . Cancer Mother     bladder CA  . Diabetes Mother   . Cancer Father     gallblader CA  . Heart disease Father   . Diabetes Sister   . Hypertension Sister   . Diabetes Brother   . Hypertension Brother   . Heart disease Brother   . Heart disease Daughter   . Stomach cancer Neg Hx     History   Social History  . Marital Status: Married    Spouse Name: N/A    Number of Children: 1  . Years of Education: N/A   Occupational History  . Retired    Social History Main Topics  . Smoking status: Former Smoker -- 1.50 packs/day for 40 years    Types: Cigarettes    Quit date: 08/28/2003  . Smokeless tobacco: Never Used  . Alcohol Use: No  . Drug Use: No  . Sexual Activity: Yes    Birth Control/ Protection: Surgical     Comment: HYSTERECTOMY   Other Topics Concern  . None   Social History Narrative  . None    Current outpatient prescriptions:cycloSPORINE (RESTASIS) 0.05 % ophthalmic emulsion, Place 1 drop into the left eye 2 (two) times daily., Disp: , Rfl: ;  estradiol  (ESTRACE) 0.5 MG tablet, Take 1 tablet (0.5 mg total) by mouth daily., Disp: 90 tablet, Rfl: 4;  furosemide (LASIX) 20 MG tablet, Take by mouth as needed. , Disp: , Rfl: ;  levothyroxine (SYNTHROID) 88 MCG tablet, Take 1 tablet (88 mcg total) by mouth daily., Disp: 90 tablet, Rfl: 3 meloxicam (MOBIC) 15 MG tablet, Take 15 mg by mouth daily.  , Disp: , Rfl: ;  NONFORMULARY OR COMPOUNDED ITEM, Estradiol .02% 1 ML Prefilled Applicator Sig: apply vaginally twice a week #90 Day Supply with 4 refills, Disp: 1 each, Rfl: 4;  omega-3 acid ethyl esters (LOVAZA) 1 G capsule, Take by mouth 2 (two) times daily., Disp: , Rfl: ;  omeprazole (PRILOSEC) 20 MG capsule, TAKE 1 CAPSULE DAILY, Disp: 90 capsule, Rfl: 0 polyethylene glycol (MIRALAX / GLYCOLAX) packet, Take 17 g by mouth daily., Disp: , Rfl: ;  Psyllium (METAMUCIL) 30.9 % POWD, Take by mouth. One tsp daily at bedtime , Disp: , Rfl: ;  zolpidem (AMBIEN) 10 MG tablet, Take 10 mg by mouth at bedtime as needed. , Disp: , Rfl: ;  mupirocin ointment (  BACTROBAN) 2 %, Place 1 application into the nose 2 (two) times daily., Disp: 22 g, Rfl: 0  EXAM:  Filed Vitals:   10/12/13 1335  BP: 118/70  Pulse: 71  Temp: 97.8 F (36.6 C)    Body mass index is 26.22 kg/(m^2).  GENERAL: vitals reviewed and listed above, alert, oriented, appears well hydrated and in no acute distress  HEENT: atraumatic, conjunttiva clear, no obvious abnormalities on inspection of external nose and ears, small sore septum L  NECK: no obvious masses on inspection  LUNGS: clear to auscultation bilaterally, no wheezes, rales or rhonchi, good air movement  CV: HRRR, no peripheral edema  MS: moves all extremities without noticeable abnormality  PSYCH: pleasant and cooperative, no obvious depression or anxiety  ASSESSMENT AND PLAN:  Discussed the following assessment and plan:  Nasal sore - Plan: mupirocin ointment (BACTROBAN) 2 %  -we discussed possible serious and likely  etiologies, workup and treatment, treatment risks and return precautions -after this discussion, Cassidy Norton opted for Bactroban, return precautions, number given for ENT and advised to see them in follow up  -of course, we advised Cassidy Norton  to return or notify a doctor immediately if symptoms worsen or persist or new concerns arise.  .   -Patient advised to return or notify a doctor immediately if symptoms worsen or persist or new concerns arise.  There are no Patient Instructions on file for this visit.   Cassidy Norton

## 2013-10-12 NOTE — Patient Instructions (Signed)
Antibiotic ointment  Follow up with Nose doctor - call for appointment

## 2013-10-13 DIAGNOSIS — L738 Other specified follicular disorders: Secondary | ICD-10-CM | POA: Diagnosis not present

## 2013-10-13 DIAGNOSIS — L678 Other hair color and hair shaft abnormalities: Secondary | ICD-10-CM | POA: Diagnosis not present

## 2013-11-04 DIAGNOSIS — L719 Rosacea, unspecified: Secondary | ICD-10-CM | POA: Diagnosis not present

## 2013-11-04 DIAGNOSIS — D239 Other benign neoplasm of skin, unspecified: Secondary | ICD-10-CM | POA: Diagnosis not present

## 2013-11-23 ENCOUNTER — Encounter: Payer: Self-pay | Admitting: Podiatry

## 2013-11-23 ENCOUNTER — Ambulatory Visit (INDEPENDENT_AMBULATORY_CARE_PROVIDER_SITE_OTHER): Payer: Medicare Other | Admitting: Podiatry

## 2013-11-23 VITALS — BP 119/66 | HR 67 | Resp 14 | Ht 64.0 in | Wt 145.0 lb

## 2013-11-23 DIAGNOSIS — G589 Mononeuropathy, unspecified: Secondary | ICD-10-CM

## 2013-11-23 NOTE — Progress Notes (Signed)
   Subjective:    [name], female    DOB: June 11, 1945, 68 y.o.   MRN: [id number]  HPI Comments: N toenail problem L right 1st toenail D 11/18/2013 O C toenail came off A no known problem, occurred while trimming her toenails T none  This patient has undergone phenol matricectomy on the left hallux nail I 07/14/2048   Review of Systems  All other systems reviewed and are negative.      Objective:   Physical Exam  Orientated x3 white female   Vascular: DP and PT pulses 2/4 bilaterally Capillary reflex immediate bilaterally  Neurological: Sensation to 10 g monofilament wire intact 0/5 bilaterally Vibratory sensation intact bilaterally Ankle flex equal and reactive bilaterally  Dermatological:  left hallux nail is not present. The left hallux nailbed demonstrates no erythema, edema, warmth The medial margin the right hallux toenail was narrowed from phenol matricectomy  Musculoskeletal: HAV deformities bilaterally     Assessment & Plan:   Assessment: Traumatic nail slough left hallux nail without a clinical sign of infection Sensory neuropathy undetermined origin  Plan: Patient advised that the left hallux nail bed needs no active treatment. Patient advised that she does have some evidence of sensory neuropathy and advised her to contact her primary care physician for a complete physical examination.  I wonder if her decreased feeling resulted in some microtrauma to the hallux which patient was unaware of resulting in the left hallux nail sloughing.  Reappoint at patient's request

## 2013-11-24 ENCOUNTER — Encounter: Payer: Self-pay | Admitting: Podiatry

## 2013-11-25 ENCOUNTER — Ambulatory Visit (INDEPENDENT_AMBULATORY_CARE_PROVIDER_SITE_OTHER): Payer: Medicare Other | Admitting: Family Medicine

## 2013-11-25 ENCOUNTER — Encounter: Payer: Self-pay | Admitting: Family Medicine

## 2013-11-25 VITALS — BP 120/70 | HR 67 | Wt 145.0 lb

## 2013-11-25 DIAGNOSIS — R209 Unspecified disturbances of skin sensation: Secondary | ICD-10-CM

## 2013-11-25 DIAGNOSIS — R202 Paresthesia of skin: Secondary | ICD-10-CM

## 2013-11-25 NOTE — Progress Notes (Signed)
   Subjective:    [name], female    DOB: [birth year], 68 y.o.   MRN: [id number]  HPI Patient seen with altered sensation and decreased sensation in both great toes. She recently went to a podiatrist. She had a toenail that came off without trauma. She apparently was tested and noted to have some impairment of sensation both toes. She describes several months of occasional tingling sensation in toes and occasional pain. No definite weakness. No history of diabetes but does have very strong family history of type 2 diabetes. Chronic PPI use and no history of previous B12 level. Recent TSH normal range. She denies any upper extremity symptoms. Never had any lower extremity weakness. No loss of bladder or bowel control.  Reviewed with no changes:  Past Medical History  Diagnosis Date  . HYPOTHYROIDISM 07/20/2008  . Abdominal pain, epigastric 08/22/2009  . Chronic low back pain   . GERD (gastroesophageal reflux disease)   . Diverticulosis   . Hemorrhoids   . Adenomatous colon polyp   . NSVD (normal spontaneous vaginal delivery)     X2   Past Surgical History  Procedure Laterality Date  . Cholecystectomy    . Blepharoplasty    . Dilation and curettage of uterus    . Abdominal hysterectomy      TVH  . Eye surgery  2008    BILATERAL CATARACT   . Back surgery      RUPTURED DISC  . Colonoscopy  JAN 2014    NEXT COLONOSCOPY IN 10 YEARS    reports that she quit smoking about 10 years ago. Her smoking use included Cigarettes. She has a 60 pack-year smoking history. She has never used smokeless tobacco. She reports that she does not drink alcohol or use illicit drugs. family history includes Cancer in her father and mother; Diabetes in her brother, mother, and sister; Heart disease in her brother, daughter, and father; Hypertension in her brother and sister. There is no history of Stomach cancer. Allergies  Allergen Reactions  . Aspirin     REACTION: Hives      Review  of Systems  Constitutional: Negative for appetite change and unexpected weight change.  Respiratory: Negative for cough and shortness of breath.   Cardiovascular: Negative for chest pain.  Gastrointestinal: Negative for abdominal pain.  Neurological: Positive for numbness. Negative for dizziness and weakness.  Hematological: Negative for adenopathy.       Objective:   Physical Exam  Constitutional: She is oriented to person, place, and time. She appears well-developed and well-nourished.  Cardiovascular: Normal rate and regular rhythm.   Pulmonary/Chest: Effort normal and breath sounds normal. No respiratory distress. She has no wheezes. She has no rales.  Musculoskeletal: She exhibits no edema.  Neurological: She is alert and oriented to person, place, and time.  She is only trace reflexes knee and ankle bilaterally. No evidence for lower extremity weakness.  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 No history of diabetes. Check labs with fasting glucose, hemoglobin A1c, B12 level.

## 2013-11-25 NOTE — Progress Notes (Signed)
Pre visit review using our clinic review tool, if applicable. No additional management support is needed unless otherwise documented below in the visit note. 

## 2013-11-25 NOTE — Patient Instructions (Signed)
Follow up promptly for any progressive numbness or weakness.

## 2013-11-26 ENCOUNTER — Other Ambulatory Visit (INDEPENDENT_AMBULATORY_CARE_PROVIDER_SITE_OTHER): Payer: Medicare Other

## 2013-11-26 DIAGNOSIS — R209 Unspecified disturbances of skin sensation: Secondary | ICD-10-CM

## 2013-11-26 DIAGNOSIS — R202 Paresthesia of skin: Secondary | ICD-10-CM

## 2013-11-26 LAB — BASIC METABOLIC PANEL
BUN: 9 mg/dL (ref 6–23)
CHLORIDE: 107 meq/L (ref 96–112)
CO2: 29 mEq/L (ref 19–32)
CREATININE: 0.6 mg/dL (ref 0.4–1.2)
Calcium: 9.3 mg/dL (ref 8.4–10.5)
GFR: 99.21 mL/min (ref >60.00)
Glucose, Bld: 105 mg/dL — ABNORMAL HIGH (ref 70–99)
POTASSIUM: 4.7 meq/L (ref 3.5–5.1)
Sodium: 142 mEq/L (ref 135–145)

## 2013-11-26 LAB — HEMOGLOBIN A1C: HEMOGLOBIN A1C: 5.5 % (ref 4.6–6.5)

## 2013-11-26 LAB — VITAMIN B12: VITAMIN B 12: 332 pg/mL (ref 211–911)

## 2013-12-02 ENCOUNTER — Other Ambulatory Visit: Payer: Self-pay | Admitting: Internal Medicine

## 2013-12-10 DIAGNOSIS — H16149 Punctate keratitis, unspecified eye: Secondary | ICD-10-CM | POA: Diagnosis not present

## 2013-12-10 DIAGNOSIS — H04129 Dry eye syndrome of unspecified lacrimal gland: Secondary | ICD-10-CM | POA: Diagnosis not present

## 2013-12-10 DIAGNOSIS — H18599 Other hereditary corneal dystrophies, unspecified eye: Secondary | ICD-10-CM | POA: Diagnosis not present

## 2013-12-22 ENCOUNTER — Telehealth: Payer: Self-pay | Admitting: Internal Medicine

## 2013-12-24 NOTE — Telephone Encounter (Signed)
The number provided states it is temporally unavailable   Will try again tomorrow

## 2013-12-30 MED ORDER — OMEPRAZOLE 20 MG PO CPDR: 20.0000 mg | DELAYED_RELEASE_CAPSULE | Freq: Every day | ORAL | Status: DC

## 2014-01-04 ENCOUNTER — Other Ambulatory Visit: Payer: Self-pay | Admitting: Gynecology

## 2014-01-04 NOTE — Telephone Encounter (Signed)
Called into pharmacy

## 2014-02-03 ENCOUNTER — Ambulatory Visit (INDEPENDENT_AMBULATORY_CARE_PROVIDER_SITE_OTHER): Payer: Medicare Other

## 2014-02-03 DIAGNOSIS — Z23 Encounter for immunization: Secondary | ICD-10-CM | POA: Diagnosis not present

## 2014-03-15 ENCOUNTER — Encounter: Payer: Self-pay | Admitting: Family Medicine

## 2014-03-17 ENCOUNTER — Other Ambulatory Visit: Payer: Self-pay | Admitting: Gynecology

## 2014-04-05 ENCOUNTER — Other Ambulatory Visit: Payer: Self-pay | Admitting: Internal Medicine

## 2014-04-29 DIAGNOSIS — M542 Cervicalgia: Secondary | ICD-10-CM | POA: Diagnosis not present

## 2014-04-29 DIAGNOSIS — M5441 Lumbago with sciatica, right side: Secondary | ICD-10-CM | POA: Diagnosis not present

## 2014-04-29 DIAGNOSIS — M5136 Other intervertebral disc degeneration, lumbar region: Secondary | ICD-10-CM | POA: Diagnosis not present

## 2014-04-29 DIAGNOSIS — M5442 Lumbago with sciatica, left side: Secondary | ICD-10-CM | POA: Diagnosis not present

## 2014-05-03 ENCOUNTER — Telehealth: Payer: Self-pay | Admitting: *Deleted

## 2014-05-03 NOTE — Telephone Encounter (Signed)
Pt states July 2015 her right 1st toenail fell off, and later grew back, now has a tingling pain in the toe.  I informed pt, the problem may or may not be related to the regrowth of the nail but she needed to be evaluated.  Pt agreed and I transferred to the schedulers.

## 2014-05-12 ENCOUNTER — Ambulatory Visit (INDEPENDENT_AMBULATORY_CARE_PROVIDER_SITE_OTHER): Payer: Medicare Other | Admitting: Podiatry

## 2014-05-12 ENCOUNTER — Encounter: Payer: Self-pay | Admitting: Podiatry

## 2014-05-12 ENCOUNTER — Ambulatory Visit (INDEPENDENT_AMBULATORY_CARE_PROVIDER_SITE_OTHER): Payer: Medicare Other

## 2014-05-12 VITALS — BP 144/72 | HR 62 | Resp 13 | Ht 64.0 in | Wt 145.0 lb

## 2014-05-12 DIAGNOSIS — M79674 Pain in right toe(s): Secondary | ICD-10-CM | POA: Diagnosis not present

## 2014-05-12 DIAGNOSIS — M7751 Other enthesopathy of right foot: Secondary | ICD-10-CM

## 2014-05-12 NOTE — Patient Instructions (Signed)
Using over-the-counter 200 mg ibuprofen tablets take 2 tablets 3 times a day 7 days Wear toe pad on the right great toe daily until pain subsides

## 2014-05-12 NOTE — Progress Notes (Signed)
   Subjective:    [name], female    DOB: 01-18-1946, 68 y.o.   MRN: [id number]  HPI Comments: N toe pain L right 1st plantar medial toe and occasional the area at the toenail base D 2 months on and off O hx of the toenail coming off without warning in 11/2013 C pin-sticking pain or like it is asleep pain A random occurring T no treatment  Pt states she is due to have a cortisone injection in two areas of her back on 05/21/2014.  Toe Pain       Review of Systems  Constitutional: Positive for activity change.       Pt states she has stopped walking on her treadmill, due to her back.  All other systems reviewed and are negative.      Objective:   Physical Exam  Orientated 3  Vascular: DP and PT pulses 2/4 bilaterally  Neurological: Sensation to 10 g monofilament wire intact 5/5 bilaterally Ankle reflex equal and reactive bilaterally Vibratory sensation reactive bilaterally Knee reflexes nonreactive bilaterally  Dermatological: Texture and turgor within normal limits  Musculoskeletal: Palpable tenderness plantar medial right interphalangeal joint causes discomfort and duplicates some of patient's symptoms No restriction of plantar flexion of interphalangeal joint on the right hallux  Manual motor testing: Hip abduction, abduction 5/5 bilaterally Knee flexion, extension 5/5 bilaterally Foot inversion, eversion, plantar flexion, dorsiflexion 5/5 bilaterally  X-ray examination right foot  Intact bony structure without fracture and/or dislocation Mallet toe second Surgical resection head a proximal phalanx fifth toe  Radiographic impression: No acute bony abnormality noted in the right foot          Assessment & Plan:   Assessment: Capsulitis interphalangeal joint right hallux Satisfactory neurovascular status  Plan: Patient has history of tolerating ibuprofen Recommended over-the-counter 200 mg ibuprofen tablets 2 tablets 3  times a day 7 days Dispensed gel toe protector and instructed to place silicone pad over the most tender area Wear pad until symptoms resolve Reappoint at patient's request

## 2014-05-13 ENCOUNTER — Encounter: Payer: Self-pay | Admitting: Podiatry

## 2014-05-21 DIAGNOSIS — M5116 Intervertebral disc disorders with radiculopathy, lumbar region: Secondary | ICD-10-CM | POA: Diagnosis not present

## 2014-05-21 DIAGNOSIS — M5136 Other intervertebral disc degeneration, lumbar region: Secondary | ICD-10-CM | POA: Diagnosis not present

## 2014-05-21 DIAGNOSIS — M4726 Other spondylosis with radiculopathy, lumbar region: Secondary | ICD-10-CM | POA: Diagnosis not present

## 2014-05-24 ENCOUNTER — Other Ambulatory Visit: Payer: Self-pay | Admitting: Internal Medicine

## 2014-05-27 ENCOUNTER — Encounter: Payer: Self-pay | Admitting: Family Medicine

## 2014-06-03 DIAGNOSIS — M5441 Lumbago with sciatica, right side: Secondary | ICD-10-CM | POA: Diagnosis not present

## 2014-06-03 DIAGNOSIS — I10 Essential (primary) hypertension: Secondary | ICD-10-CM | POA: Diagnosis not present

## 2014-06-03 DIAGNOSIS — M5032 Other cervical disc degeneration, mid-cervical region: Secondary | ICD-10-CM | POA: Diagnosis not present

## 2014-06-03 DIAGNOSIS — M542 Cervicalgia: Secondary | ICD-10-CM | POA: Diagnosis not present

## 2014-06-03 DIAGNOSIS — M5442 Lumbago with sciatica, left side: Secondary | ICD-10-CM | POA: Diagnosis not present

## 2014-06-14 DIAGNOSIS — M5441 Lumbago with sciatica, right side: Secondary | ICD-10-CM | POA: Diagnosis not present

## 2014-06-17 ENCOUNTER — Other Ambulatory Visit (INDEPENDENT_AMBULATORY_CARE_PROVIDER_SITE_OTHER): Payer: Medicare Other

## 2014-06-17 ENCOUNTER — Encounter: Payer: Self-pay | Admitting: Internal Medicine

## 2014-06-17 ENCOUNTER — Ambulatory Visit (INDEPENDENT_AMBULATORY_CARE_PROVIDER_SITE_OTHER): Payer: Medicare Other | Admitting: Internal Medicine

## 2014-06-17 VITALS — BP 144/68 | HR 64 | Ht 62.75 in | Wt 149.0 lb

## 2014-06-17 DIAGNOSIS — K219 Gastro-esophageal reflux disease without esophagitis: Secondary | ICD-10-CM | POA: Diagnosis not present

## 2014-06-17 DIAGNOSIS — Z86010 Personal history of colonic polyps: Secondary | ICD-10-CM

## 2014-06-17 DIAGNOSIS — K5909 Other constipation: Secondary | ICD-10-CM | POA: Diagnosis not present

## 2014-06-17 DIAGNOSIS — R1011 Right upper quadrant pain: Secondary | ICD-10-CM

## 2014-06-17 DIAGNOSIS — K5904 Chronic idiopathic constipation: Secondary | ICD-10-CM

## 2014-06-17 LAB — HEPATIC FUNCTION PANEL
ALBUMIN: 3.8 g/dL (ref 3.5–5.2)
ALT: 14 U/L (ref 0–35)
AST: 15 U/L (ref 0–37)
Alkaline Phosphatase: 82 U/L (ref 39–117)
Bilirubin, Direct: 0.1 mg/dL (ref 0.0–0.3)
TOTAL PROTEIN: 6.4 g/dL (ref 6.0–8.3)
Total Bilirubin: 0.5 mg/dL (ref 0.2–1.2)

## 2014-06-17 MED ORDER — OMEPRAZOLE 20 MG PO CPDR: 20.0000 mg | DELAYED_RELEASE_CAPSULE | Freq: Every day | ORAL | Status: DC

## 2014-06-17 NOTE — Progress Notes (Signed)
HISTORY OF PRESENT ILLNESS:  Cassidy Norton is a 69 y.o. female with past medical history as listed below. She presents today regarding a new complaint of right of quadrant pain as well as ongoing management of her chronic GERD and constipation. Patient has not been seen in this office since April 2012. See that dictation. Chief complaint today is a several month history of right upper quadrant pain. She describes this as a burning and rolling discomfort. Initially intermittent. However, over the past week or so, on a daily basis. The duration is variable but can last all day. This has not noticed while sleeping. There are no exacerbating or relieving factors such as meals or change in body position. The patient is status post cholecystectomy and ERCP with sphincterotomy in 2004. She does not feel that the discomfort is similar to her gallbladder issues. For her chronic constipation she takes daily Metamucil and as needed MiraLAX. Currently reports excellent regular bowel habits. Her weight has been stable. No bleeding. No fevers. She denies urologic symptoms such as burning or hematuria. She does have chronic back problems, though this is the lower back. Also, prior history of shingles on the right side, but remote and lower.  REVIEW OF SYSTEMS:  All non-GI ROS negative except for back pain and sleeping problems  Past Medical History  Diagnosis Date  . HYPOTHYROIDISM 07/20/2008  . Abdominal pain, epigastric 08/22/2009  . Chronic low back pain   . GERD (gastroesophageal reflux disease)   . Diverticulosis   . Hemorrhoids   . Adenomatous colon polyp   . NSVD (normal spontaneous vaginal delivery)     X2    Past Surgical History  Procedure Laterality Date  . Cholecystectomy    . Blepharoplasty    . Dilation and curettage of uterus    . Abdominal hysterectomy      TVH  . Eye surgery  2008    BILATERAL CATARACT   . Back surgery      RUPTURED DISC  . Colonoscopy  JAN 2014    NEXT COLONOSCOPY  IN 10 YEARS    Social History Cassidy Norton  reports that she quit smoking about 10 years ago. Her smoking use included Cigarettes. She has a 60 pack-year smoking history. She has never used smokeless tobacco. She reports that she does not drink alcohol or use illicit drugs.  family history includes Cancer in her father and mother; Diabetes in her brother, mother, and sister; Heart disease in her brother, daughter, and father; Hypertension in her brother and sister. There is no history of Stomach cancer.  Allergies  Allergen Reactions  . Aspirin     REACTION: Hives       PHYSICAL EXAMINATION: Vital signs: BP 144/68 mmHg  Pulse 64  Ht 5' 2.75" (1.594 m)  Wt 149 lb (67.586 kg)  BMI 26.60 kg/m2 General: Well-developed, well-nourished, no acute distress HEENT: Sclerae are anicteric, conjunctiva pink. Oral mucosa intact Lungs: Clear Heart: Regular Abdomen: soft, nontender, nondistended, no obvious ascites, no peritoneal signs, normal bowel sounds. No organomegaly. Extremities: No edema Psychiatric: alert and oriented x3. Cooperative     ASSESSMENT:  #1. Right upper quadrant pain as described. Possibly musculoskeletal based on history. Unlikely choledocholithiasis, but should be investigated given history. #2. Chronic GERD. Symptoms controlled with PPI #3. Chronic constipation. Managed with fiber and MiraLAX #4. History of adenomatous colon polyps. No neoplasia on last exam. Due for follow-up surveillance around January 2024  PLAN:  #1. Obtain liver function tests.  We will contact patient with results #2. Abdominal ultrasound to evaluate right upper quadrant pain. We will contact patient with results #3. Reflux precautions #4. Re-prescribe omeprazole 20 mg daily #5. Continue daily fiber and as needed MiraLAX #6. Surveillance colonoscopy in 2024 #7. Routine GI follow-up for management of chronic problems in 2 years unless otherwise indicated

## 2014-06-17 NOTE — Patient Instructions (Signed)
We have sent the following medications to your pharmacy:  Omeprazole  Your physician has requested that you go to the basement for the following lab work before leaving today:  LFTs  You have been scheduled for an abdominal ultrasound at [facility] (1st floor of hospital) on 06/22/2014 at 9:30am. Please arrive 15 minutes prior to your appointment for registration. Make certain not to have anything to eat or drink 6 hours prior to your appointment. Should you need to reschedule your appointment, please contact radiology at [phone]. This test typically takes about 30 minutes to perform.

## 2014-06-21 DIAGNOSIS — M5136 Other intervertebral disc degeneration, lumbar region: Secondary | ICD-10-CM | POA: Diagnosis not present

## 2014-06-21 DIAGNOSIS — M5442 Lumbago with sciatica, left side: Secondary | ICD-10-CM | POA: Diagnosis not present

## 2014-06-22 ENCOUNTER — Ambulatory Visit (HOSPITAL_COMMUNITY)
Admission: RE | Admit: 2014-06-22 | Discharge: 2014-06-22 | Disposition: A | Discharge status: Home / Self Care | Payer: Medicare Other | Source: Ambulatory Visit | Attending: Internal Medicine | Admitting: Internal Medicine

## 2014-06-22 DIAGNOSIS — K219 Gastro-esophageal reflux disease without esophagitis: Secondary | ICD-10-CM

## 2014-06-22 DIAGNOSIS — R1011 Right upper quadrant pain: Secondary | ICD-10-CM | POA: Diagnosis not present

## 2014-06-22 DIAGNOSIS — R932 Abnormal findings on diagnostic imaging of liver and biliary tract: Secondary | ICD-10-CM | POA: Insufficient documentation

## 2014-06-22 DIAGNOSIS — Z9049 Acquired absence of other specified parts of digestive tract: Secondary | ICD-10-CM | POA: Insufficient documentation

## 2014-06-22 DIAGNOSIS — K7689 Other specified diseases of liver: Secondary | ICD-10-CM | POA: Diagnosis not present

## 2014-06-22 DIAGNOSIS — N281 Cyst of kidney, acquired: Secondary | ICD-10-CM | POA: Diagnosis not present

## 2014-06-22 DIAGNOSIS — K838 Other specified diseases of biliary tract: Secondary | ICD-10-CM | POA: Diagnosis not present

## 2014-06-27 ENCOUNTER — Other Ambulatory Visit: Payer: Self-pay | Admitting: Internal Medicine

## 2014-07-01 ENCOUNTER — Encounter: Payer: Medicare Other | Admitting: Gynecology

## 2014-07-19 ENCOUNTER — Telehealth: Payer: Self-pay | Admitting: Internal Medicine

## 2014-07-19 NOTE — Telephone Encounter (Signed)
Pt states she is still having RUQ abdominal pain, states she is hurting right up under her breast and that the scar where she had her gallbladder is sore. Pt wants to know if she needs to follow-up with GI? Please advise.

## 2014-07-19 NOTE — Telephone Encounter (Signed)
Pt aware.

## 2014-07-19 NOTE — Telephone Encounter (Signed)
Likely musculoskeletal and not GI. Return to her PCP at this point

## 2014-07-20 ENCOUNTER — Encounter: Payer: Self-pay | Admitting: Family Medicine

## 2014-07-20 ENCOUNTER — Ambulatory Visit (INDEPENDENT_AMBULATORY_CARE_PROVIDER_SITE_OTHER): Payer: Medicare Other | Admitting: Family Medicine

## 2014-07-20 VITALS — BP 124/70 | HR 60 | Temp 97.5°F | Wt 148.0 lb

## 2014-07-20 DIAGNOSIS — R1012 Left upper quadrant pain: Principal | ICD-10-CM

## 2014-07-20 DIAGNOSIS — R1011 Right upper quadrant pain: Secondary | ICD-10-CM

## 2014-07-20 DIAGNOSIS — R1010 Upper abdominal pain, unspecified: Secondary | ICD-10-CM

## 2014-07-20 NOTE — Progress Notes (Signed)
Subjective:    [name], female    DOB: 1945/12/16, 69 y.o.   MRN: [id number]  HPI Patient seen with onset approximately one month ago of bilateral "soreness "radiating from the epigastric region bilateral. She went to gastroenterologist about a month ago and had ultrasound which showed probably incidental hemangioma but no other acute abnormalities. She's had previous cholecystectomy. She has a long history of GERD and takes omeprazole. GERD symptoms are stable. No recent appetite or weight changes. She describes pain which has somewhat of a burning quality which radiates bilateral around T6-T7 region. She's not had any rash. Severity is 3 out of 10. Symptoms are somewhat intermittent. Denies any thoracic back pain. No appetite or weight changes. No pleuritic pain. No cough. She denies any history of similar pain although she has had shingles once before and pain was somewhat similar. This pain is bilateral. No alleviating or exacerbating factors.  Denies any stool changes. No melena.  Past Medical History  Diagnosis Date  . HYPOTHYROIDISM 07/20/2008  . Abdominal pain, epigastric 08/22/2009  . Chronic low back pain   . GERD (gastroesophageal reflux disease)   . Diverticulosis   . Hemorrhoids   . Adenomatous colon polyp   . NSVD (normal spontaneous vaginal delivery)     X2   Past Surgical History  Procedure Laterality Date  . Cholecystectomy    . Blepharoplasty    . Dilation and curettage of uterus    . Abdominal hysterectomy      TVH  . Eye surgery  2008    BILATERAL CATARACT   . Back surgery      RUPTURED DISC  . Colonoscopy  JAN 2014    NEXT COLONOSCOPY IN 10 YEARS    reports that she quit smoking about 10 years ago. Her smoking use included Cigarettes. She has a 60 pack-year smoking history. She has never used smokeless tobacco. She reports that she does not drink alcohol or use illicit drugs. family history includes Cancer in her father and mother; Diabetes  in her brother, mother, and sister; Heart disease in her brother, daughter, and father; Hypertension in her brother and sister. There is no history of Stomach cancer. Allergies  Allergen Reactions  . Aspirin     REACTION: Hives      Review of Systems  Constitutional: Negative for fever, appetite change and unexpected weight change.  HENT: Negative for trouble swallowing.   Respiratory: Negative for cough and shortness of breath.   Cardiovascular: Negative for chest pain.  Gastrointestinal: Negative for nausea, vomiting, diarrhea and constipation.  Genitourinary: Negative for dysuria.  Musculoskeletal: Negative for back pain.  Skin: Negative for rash.  Hematological: Negative for adenopathy.       Objective:   Physical Exam  Constitutional: She appears well-developed and well-nourished.  Neck: Neck supple.  Cardiovascular: Normal rate and regular rhythm.   Pulmonary/Chest: Effort normal and breath sounds normal. No respiratory distress. She has no wheezes. She has no rales.  Abdominal: Soft. Bowel sounds are normal. She exhibits no distension and no mass. There is no rebound and no guarding.  Minimally tender epigastric region. No mass.  Musculoskeletal: She exhibits no edema.  No thoracic spinal tenderness  Lymphadenopathy:    She has no cervical adenopathy.  Skin: No rash noted.          Assessment & Plan:  Patient presents with bilateral burning type pain which sounds almost more neuropathic around T6-T7 region. No rash. Symptoms are relatively  mild. We discussed other evaluation such as consideration for thoracic MRI but at this point she wishes to observe. We also discussed possible options such as gabapentin but at this point her pain is relatively minimal. Observe for another month. If symptoms not improving at that point consider thoracic spine films to further evaluate.

## 2014-07-20 NOTE — Progress Notes (Signed)
Pre visit review using our clinic review tool, if applicable. No additional management support is needed unless otherwise documented below in the visit note. 

## 2014-07-20 NOTE — Patient Instructions (Signed)
Follow up for any skin rash, fever, loss of appetite or weight, or worsening pain

## 2014-07-21 ENCOUNTER — Ambulatory Visit: Payer: Medicare Other | Admitting: Family Medicine

## 2014-07-26 DIAGNOSIS — Z961 Presence of intraocular lens: Secondary | ICD-10-CM | POA: Diagnosis not present

## 2014-07-26 DIAGNOSIS — H35033 Hypertensive retinopathy, bilateral: Secondary | ICD-10-CM | POA: Diagnosis not present

## 2014-07-26 DIAGNOSIS — H524 Presbyopia: Secondary | ICD-10-CM | POA: Diagnosis not present

## 2014-07-26 DIAGNOSIS — H1859 Other hereditary corneal dystrophies: Secondary | ICD-10-CM | POA: Diagnosis not present

## 2014-07-27 ENCOUNTER — Other Ambulatory Visit: Payer: Self-pay | Admitting: Gynecology

## 2014-07-28 NOTE — Telephone Encounter (Signed)
Called into pharmacy

## 2014-07-28 NOTE — Telephone Encounter (Signed)
RGCE scheduled 08/04/14 with you.

## 2014-08-05 ENCOUNTER — Encounter: Payer: Self-pay | Admitting: Gynecology

## 2014-08-05 ENCOUNTER — Ambulatory Visit (INDEPENDENT_AMBULATORY_CARE_PROVIDER_SITE_OTHER): Payer: Medicare Other | Admitting: Gynecology

## 2014-08-05 VITALS — BP 122/78 | Ht 63.0 in | Wt 151.0 lb

## 2014-08-05 DIAGNOSIS — Z01419 Encounter for gynecological examination (general) (routine) without abnormal findings: Secondary | ICD-10-CM | POA: Diagnosis not present

## 2014-08-05 DIAGNOSIS — Z1231 Encounter for screening mammogram for malignant neoplasm of breast: Secondary | ICD-10-CM | POA: Diagnosis not present

## 2014-08-05 DIAGNOSIS — R140 Abdominal distension (gaseous): Secondary | ICD-10-CM | POA: Diagnosis not present

## 2014-08-05 DIAGNOSIS — Z79890 Hormone replacement therapy (postmenopausal): Secondary | ICD-10-CM | POA: Diagnosis not present
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
## 2014-08-05 NOTE — Patient Instructions (Signed)

## 2014-08-11 ENCOUNTER — Encounter: Payer: Self-pay | Admitting: Gynecology

## 2014-08-16 ENCOUNTER — Ambulatory Visit (INDEPENDENT_AMBULATORY_CARE_PROVIDER_SITE_OTHER): Payer: Medicare Other

## 2014-08-16 ENCOUNTER — Other Ambulatory Visit: Payer: Self-pay | Admitting: Gynecology

## 2014-08-16 ENCOUNTER — Ambulatory Visit (INDEPENDENT_AMBULATORY_CARE_PROVIDER_SITE_OTHER): Payer: Medicare Other | Admitting: Gynecology

## 2014-08-16 VITALS — BP 116/70

## 2014-08-16 DIAGNOSIS — N8320 Unspecified ovarian cysts: Secondary | ICD-10-CM

## 2014-08-16 DIAGNOSIS — N83319 Acquired atrophy of ovary, unspecified side: Secondary | ICD-10-CM

## 2014-08-16 DIAGNOSIS — R1084 Generalized abdominal pain: Secondary | ICD-10-CM | POA: Diagnosis not present

## 2014-08-16 DIAGNOSIS — R140 Abdominal distension (gaseous): Secondary | ICD-10-CM

## 2014-08-16 DIAGNOSIS — R1909 Other intra-abdominal and pelvic swelling, mass and lump: Secondary | ICD-10-CM | POA: Diagnosis not present

## 2014-08-16 DIAGNOSIS — R350 Frequency of micturition: Secondary | ICD-10-CM | POA: Diagnosis not present

## 2014-08-16 DIAGNOSIS — N8331 Acquired atrophy of ovary: Secondary | ICD-10-CM

## 2014-08-16 DIAGNOSIS — N8300 Follicular cyst of ovary, unspecified side: Secondary | ICD-10-CM

## 2014-08-16 DIAGNOSIS — N83201 Unspecified ovarian cyst, right side: Secondary | ICD-10-CM | POA: Insufficient documentation

## 2014-08-16 LAB — URINALYSIS W MICROSCOPIC + REFLEX CULTURE
Bilirubin Urine: NEGATIVE
Casts: NONE SEEN
Crystals: NONE SEEN
Glucose, UA: NEGATIVE mg/dL
Ketones, ur: NEGATIVE mg/dL
Leukocytes, UA: NEGATIVE
Nitrite: NEGATIVE
Protein, ur: NEGATIVE mg/dL
Specific Gravity, Urine: 1.01 (ref 1.005–1.030)
Urobilinogen, UA: 0.2 mg/dL (ref 0.0–1.0)
WBC, UA: NONE SEEN WBC/hpf
pH: 6.5 (ref 5.0–8.0)

## 2014-08-16 NOTE — Patient Instructions (Signed)
CA-125 Tumor Marker CA 125 is a tumor marker that is used to help monitor the course of ovarian or endometrial cancer. PREPARATION FOR TEST No preparation is necessary. NORMAL FINDINGS Adults: 0-35 units/mL (0-35 kilounits)/L Ranges for normal findings may vary among different laboratories and hospitals. You should always check with your doctor after having lab work or other tests done to discuss the meaning of your test results and whether your values are considered within normal limits. MEANING OF TEST  Your caregiver will go over the test results with you and discuss the importance and meaning of your results, as well as treatment options and the need for additional tests if necessary. OBTAINING THE TEST RESULTS It is your responsibility to obtain your test results. Ask the lab or department performing the test when and how you will get your results. Document Released: 05/22/2004 Document Revised: 07/23/2011 Document Reviewed: 04/07/2008 [facility] Patient Information 2015 [place], [place]. This information is not intended to replace advice given to you by your health care provider. Make sure you discuss any questions you have with your health care provider. Ovarian Cyst An ovarian cyst is a fluid-filled sac that forms on an ovary. The ovaries are small organs that produce eggs in women. Various types of cysts can form on the ovaries. Most are not cancerous. Many do not cause problems, and they often go away on their own. Some may cause symptoms and require treatment. Common types of ovarian cysts include:  Functional cysts--These cysts may occur every month during the menstrual cycle. This is normal. The cysts usually go away with the next menstrual cycle if the woman does not get pregnant. Usually, there are no symptoms with a functional cyst.  Endometrioma cysts--These cysts form from the tissue that lines the uterus. They are also called "chocolate cysts" because they become filled with blood  that turns brown. This type of cyst can cause pain in the lower abdomen during intercourse and with your menstrual period.  Cystadenoma cysts--This type develops from the cells on the outside of the ovary. These cysts can get very big and cause lower abdomen pain and pain with intercourse. This type of cyst can twist on itself, cut off its blood supply, and cause severe pain. It can also easily rupture and cause a lot of pain.  Dermoid cysts--This type of cyst is sometimes found in both ovaries. These cysts may contain different kinds of body tissue, such as skin, teeth, hair, or cartilage. They usually do not cause symptoms unless they get very big.  Theca lutein cysts--These cysts occur when too much of a certain hormone (human chorionic gonadotropin) is produced and overstimulates the ovaries to produce an egg. This is most common after procedures used to assist with the conception of a baby (in vitro fertilization). CAUSES   Fertility drugs can cause a condition in which multiple large cysts are formed on the ovaries. This is called ovarian hyperstimulation syndrome.  A condition called polycystic ovary syndrome can cause hormonal imbalances that can lead to nonfunctional ovarian cysts. SIGNS AND SYMPTOMS  Many ovarian cysts do not cause symptoms. If symptoms are present, they may include:  Pelvic pain or pressure.  Pain in the lower abdomen.  Pain during sexual intercourse.  Increasing girth (swelling) of the abdomen.  Abnormal menstrual periods.  Increasing pain with menstrual periods.  Stopping having menstrual periods without being pregnant. DIAGNOSIS  These cysts are commonly found during a routine or annual pelvic exam. Tests may be ordered to find  out more about the cyst. These tests may include:  Ultrasound.  X-ray of the pelvis.  CT scan.  MRI.  Blood tests. TREATMENT  Many ovarian cysts go away on their own without treatment. Your health care provider may want  to check your cyst regularly for 2-3 months to see if it changes. For women in menopause, it is particularly important to monitor a cyst closely because of the higher rate of ovarian cancer in menopausal women. When treatment is needed, it may include any of the following:  A procedure to drain the cyst (aspiration). This may be done using a long needle and ultrasound. It can also be done through a laparoscopic procedure. This involves using a thin, lighted tube with a tiny camera on the end (laparoscope) inserted through a small incision.  Surgery to remove the whole cyst. This may be done using laparoscopic surgery or an open surgery involving a larger incision in the lower abdomen.  Hormone treatment or birth control pills. These methods are sometimes used to help dissolve a cyst. HOME CARE INSTRUCTIONS   Only take over-the-counter or prescription medicines as directed by your health care provider.  Follow up with your health care provider as directed.  Get regular pelvic exams and Pap tests. SEEK MEDICAL CARE IF:   Your periods are late, irregular, or painful, or they stop.  Your pelvic pain or abdominal pain does not go away.  Your abdomen becomes larger or swollen.  You have pressure on your bladder or trouble emptying your bladder completely.  You have pain during sexual intercourse.  You have feelings of fullness, pressure, or discomfort in your stomach.  You lose weight for no apparent reason.  You feel generally ill.  You become constipated.  You lose your appetite.  You develop acne.  You have an increase in body and facial hair.  You are gaining weight, without changing your exercise and eating habits.  You think you are pregnant. SEEK IMMEDIATE MEDICAL CARE IF:   You have increasing abdominal pain.  You feel sick to your stomach (nauseous), and you throw up (vomit).  You develop a fever that comes on suddenly.  You have abdominal pain during a bowel  movement.  Your menstrual periods become heavier than usual. MAKE SURE YOU:  Understand these instructions.  Will watch your condition.  Will get help right away if you are not doing well or get worse. Document Released: 04/30/2005 Document Revised: 05/05/2013 Document Reviewed: 01/05/2013 [facility] Patient Information 2015 [place], [place]. This information is not intended to replace advice given to you by your health care provider. Make sure you discuss any questions you have with your health care provider.

## 2014-08-16 NOTE — Progress Notes (Addendum)
   Patient presented to the office today to discuss her ultrasound. She was seen the office for her annual gynecological examination March 24. Patient been complaining of abdominal bloating. Patient stated sometimes does not even associated with meals.She did see her gastroenterologist Dr. Henrene Pastor early this year who had ordered an upper abdominal ultrasound. Her colonoscopy was in 2014 and benign polyps had been removed. She denies any hematochezia. Patient states that she urinates frequently but she consumes a lot of water during the day. She denies any stress incontinence.  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 A thinwall echo-free follicle cysts on the right ovary was noted with a dimensions of 15 x 9 x 16 mm and avascular. Left ovary was normal and atrophic. No apparent adnexal masses. There was noted excessive bowel in the right and left lower abdomen.  Urinalysis today was negative  Assessment/plan: Postmenopausal patient complaining of abdominal bloating. I have recommended she follow-up with her gastroenterologist Dr. Henrene Pastor. We discussed the findings of a simple right follicle cyst for which we have no prior ultrasound to compare. We will repeat the study in 6 months. We will draw a CA 125. Its limitations were discussed with the patient.

## 2014-08-17 LAB — CA 125: CA 125: 7 U/mL (ref <35)

## 2014-08-24 ENCOUNTER — Other Ambulatory Visit: Payer: Self-pay

## 2014-08-24 MED ORDER — NONFORMULARY OR COMPOUNDED ITEM: Status: DC

## 2014-09-13 DIAGNOSIS — Z79891 Long term (current) use of opiate analgesic: Secondary | ICD-10-CM | POA: Diagnosis not present

## 2014-09-13 DIAGNOSIS — M5442 Lumbago with sciatica, left side: Secondary | ICD-10-CM | POA: Diagnosis not present

## 2014-09-13 DIAGNOSIS — M961 Postlaminectomy syndrome, not elsewhere classified: Secondary | ICD-10-CM | POA: Diagnosis not present

## 2014-09-14 ENCOUNTER — Other Ambulatory Visit: Payer: Self-pay | Admitting: Family Medicine

## 2014-09-21 ENCOUNTER — Other Ambulatory Visit: Payer: Self-pay | Admitting: Gynecology

## 2014-09-21 ENCOUNTER — Ambulatory Visit (INDEPENDENT_AMBULATORY_CARE_PROVIDER_SITE_OTHER): Payer: Medicare Other

## 2014-09-21 DIAGNOSIS — Z79890 Hormone replacement therapy (postmenopausal): Secondary | ICD-10-CM | POA: Diagnosis not present

## 2014-09-21 DIAGNOSIS — Z13820 Encounter for screening for osteoporosis: Secondary | ICD-10-CM

## 2014-09-21 DIAGNOSIS — M8580 Other specified disorders of bone density and structure, unspecified site: Secondary | ICD-10-CM

## 2014-09-23 ENCOUNTER — Telehealth: Payer: Self-pay | Admitting: *Deleted

## 2014-09-23 MED ORDER — ESTRADIOL 0.5 MG PO TABS: 0.5000 mg | ORAL_TABLET | Freq: Every day | ORAL | Status: DC

## 2014-09-23 NOTE — Telephone Encounter (Signed)
Pt called because estradiol 0.5 mg was never sent to pharmacy from [facility] on 08/05/14. Rx will be sent.

## 2014-10-08 DIAGNOSIS — J029 Acute pharyngitis, unspecified: Secondary | ICD-10-CM | POA: Diagnosis not present

## 2014-10-13 ENCOUNTER — Encounter: Payer: Self-pay | Admitting: Family Medicine

## 2014-10-13 ENCOUNTER — Ambulatory Visit (INDEPENDENT_AMBULATORY_CARE_PROVIDER_SITE_OTHER): Payer: Medicare Other | Admitting: Family Medicine

## 2014-10-13 VITALS — BP 130/74 | HR 74 | Temp 98.0°F | Wt 150.0 lb

## 2014-10-13 DIAGNOSIS — B9789 Other viral agents as the cause of diseases classified elsewhere: Principal | ICD-10-CM

## 2014-10-13 DIAGNOSIS — J069 Acute upper respiratory infection, unspecified: Secondary | ICD-10-CM | POA: Diagnosis not present

## 2014-10-13 MED ORDER — AMOXICILLIN 875 MG PO TABS: 875.0000 mg | ORAL_TABLET | Freq: Two times a day (BID) | ORAL | Status: DC

## 2014-10-13 MED ORDER — HYDROCODONE-HOMATROPINE 5-1.5 MG/5ML PO SYRP: 5.0000 mL | ORAL_SOLUTION | Freq: Four times a day (QID) | ORAL | Status: AC | PRN

## 2014-10-13 NOTE — Patient Instructions (Signed)
Cough, Adult  A cough is a reflex that helps clear your throat and airways. It can help heal the body or may be a reaction to an irritated airway. A cough may only last 2 or 3 weeks (acute) or may last more than 8 weeks (chronic).  CAUSES Acute cough:  Viral or bacterial infections. Chronic cough:  Infections.  Allergies.  Asthma.  Post-nasal drip.  Smoking.  Heartburn or acid reflux.  Some medicines.  Chronic lung problems (COPD).  Cancer. SYMPTOMS   Cough.  Fever.  Chest pain.  Increased breathing rate.  High-pitched whistling sound when breathing (wheezing).  Colored mucus that you cough up (sputum). TREATMENT   A bacterial cough may be treated with antibiotic medicine.  A viral cough must run its course and will not respond to antibiotics.  Your caregiver may recommend other treatments if you have a chronic cough. HOME CARE INSTRUCTIONS   Only take over-the-counter or prescription medicines for pain, discomfort, or fever as directed by your caregiver. Use cough suppressants only as directed by your caregiver.  Use a cold steam vaporizer or humidifier in your bedroom or home to help loosen secretions.  Sleep in a semi-upright position if your cough is worse at night.  Rest as needed.  Stop smoking if you smoke. SEEK IMMEDIATE MEDICAL CARE IF:   You have pus in your sputum.  Your cough starts to worsen.  You cannot control your cough with suppressants and are losing sleep.  You begin coughing up blood.  You have difficulty breathing.  You develop pain which is getting worse or is uncontrolled with medicine.  You have a fever. MAKE SURE YOU:   Understand these instructions.  Will watch your condition.  Will get help right away if you are not doing well or get worse. Document Released: 10/27/2010 Document Revised: 07/23/2011 Document Reviewed: 10/27/2010 [employer] Patient Information 2015 [employer]. This information is not intended  to replace advice given to you by your health care provider. Make sure you discuss any questions you have with your health care provider.  

## 2014-10-13 NOTE — Progress Notes (Signed)
   Subjective:    [name], female    DOB: October 16, 1945, 69 y.o.   MRN: [id number]  HPI Patient seen with cough, sore throat, nasal congestion. Onset of symptoms about a week ago. She developed fairly severe sore throat. [facility] and rapid strep negative. Diagnosed as viral. Her cough has progressed and is especially severe at night. Keeping her awake for several hours. She has tried Robitussin and throat lozenges without improvement. No fevers or chills.  Does have some colored nasal discharge, especially left naris. Occasional headaches. No sinus pressure. Increased fatigue.  Past Medical History  Diagnosis Date  . HYPOTHYROIDISM 07/20/2008  . Abdominal pain, epigastric 08/22/2009  . Chronic low back pain   . GERD (gastroesophageal reflux disease)   . Diverticulosis   . Hemorrhoids   . Adenomatous colon polyp   . NSVD (normal spontaneous vaginal delivery)     X2   Past Surgical History  Procedure Laterality Date  . Cholecystectomy    . Blepharoplasty    . Dilation and curettage of uterus    . Abdominal hysterectomy      TV[facility]  . Eye surgery  2008    BILATERAL CATARACT   . Back surgery      RUPTURED DISC  . Colonoscopy  JAN 2014    NEXT COLONOSCOPY IN 10 YEARS    reports that she quit smoking about 11 years ago. Her smoking use included Cigarettes. She has a 60 pack-year smoking history. She has never used smokeless tobacco. She reports that she does not drink alcohol or use illicit drugs. family history includes Cancer in her father and mother; Diabetes in her brother, mother, and sister; Heart disease in her brother, daughter, and father; Hypertension in her brother and sister. There is no history of Stomach cancer. Allergies  Allergen Reactions  . Aspirin     REACTION: Hives      Review of Systems  Constitutional: Positive for fatigue. Negative for fever and chills.  HENT: Positive for congestion and sore throat. Negative for sinus pressure.    Respiratory: Positive for cough.        Objective:   Physical Exam  Constitutional: She appears well-developed and well-nourished.  HENT:  Right Ear: External ear normal.  Left Ear: External ear normal.  Mouth/Throat: Oropharynx is clear and moist.  Neck: Neck supple.  Cardiovascular: Normal rate and regular rhythm.   Pulmonary/Chest: Effort normal and breath sounds normal. No respiratory distress. She has no wheezes. She has no rales.  Lymphadenopathy:    She has no cervical adenopathy.          Assessment & Plan:  Upper respiratory symptoms-suspect viral. Cough has been severe at times. Hycodan cough syrup 1 teaspoon every 6 hours for severe cough.  If she has any persistent left facial pain or if symptoms not improving over the next few days consider amoxicillin 875 mg twice daily for 10 days

## 2014-10-13 NOTE — Progress Notes (Signed)
Pre visit review using our clinic review tool, if applicable. No additional management support is needed unless otherwise documented below in the visit note. 

## 2014-10-18 ENCOUNTER — Encounter: Payer: Self-pay | Admitting: Physician Assistant

## 2014-10-18 ENCOUNTER — Ambulatory Visit (INDEPENDENT_AMBULATORY_CARE_PROVIDER_SITE_OTHER): Payer: Medicare Other | Admitting: Physician Assistant

## 2014-10-18 VITALS — BP 130/66 | HR 72 | Ht 63.0 in | Wt 150.0 lb

## 2014-10-18 DIAGNOSIS — R1013 Epigastric pain: Secondary | ICD-10-CM

## 2014-10-18 MED ORDER — SUCRALFATE 1 G PO TABS: 1.0000 g | ORAL_TABLET | Freq: Three times a day (TID) | ORAL | Status: DC

## 2014-10-18 MED ORDER — OMEPRAZOLE 20 MG PO CPDR: 20.0000 mg | DELAYED_RELEASE_CAPSULE | Freq: Two times a day (BID) | ORAL | Status: DC

## 2014-10-18 MED ORDER — SACCHAROMYCES BOULARDII 250 MG PO CAPS: 250.0000 mg | ORAL_CAPSULE | Freq: Two times a day (BID) | ORAL | Status: DC

## 2014-10-18 NOTE — Patient Instructions (Addendum)
You have been scheduled for an endoscopy. Please follow written instructions given to you at your visit today. If you use inhalers (even only as needed), please bring them with you on the day of your procedure. Your physician has requested that you go to www.startemmi.com and enter the access code given to you at your visit today. This web site gives a general overview about your procedure. However, you should still follow specific instructions given to you by our office regarding your preparation for the procedure.  Stop Celebrex use  Tylenol. Increase the Prilosec 20 mg twice daily. [facility].  Carafate 1 gram between meals and at bedtime.  Florastor, take 1 tab twice daily for 1 month.

## 2014-10-18 NOTE — Progress Notes (Signed)
Agree with initial assessment and plans as outlined 

## 2014-10-18 NOTE — Progress Notes (Signed)
Patient ID: ADISSON DEAK, female   DOB: 03/25/1946, 69 y.o.   MRN: [id number]   Subjective:    [name], female    DOB: 05/27/1945, 69 y.o.   MRN: [id number]  HPI Cassidy Norton  Is a pleasant 69 year old female known to Dr. Henrene Pastor. She has history of chronic GERD, and colon polyps. She had colonoscopy done in 2014 which was a negative exam. She is status post cholecystectomy. She comes in today with several month history of burning type pain in her epigastrium sometimes radiating to the left side. She says most of the time she feels better with some food on her stomach and worse on an empty stomach. She complains of a bloated pressure-type sensation in her upper abdomen. Her bowel movements have been normal she has not noted any melena or hematochezia. She has had some occasional nausea. She has been on chronic NSAIDs for back disease. She says she has been on Mobic for several years and was started on Celebrex in March. She is maintained on Prilosec 20 mg by mouth daily for acid reflux. He is not using any other aspirin or NSAIDs other than prescription Celebrex.  Review of Systems  Pertinent positive and negative review of systems were noted in the above HPI section.  All other review of systems was otherwise negative.  Outpatient Encounter Prescriptions as of 10/18/2014  Medication Sig  . celecoxib (CELEBREX) 50 MG capsule Take 50 mg by mouth daily.  . cycloSPORINE (RESTASIS) 0.05 % ophthalmic emulsion Place 1 drop into the left eye 2 (two) times daily.  Marland Kitchen estradiol (ESTRACE) 0.5 MG tablet Take 1 tablet (0.5 mg total) by mouth daily.  . furosemide (LASIX) 20 MG tablet Take by mouth as needed.   Marland Kitchen HYDROcodone-acetaminophen (NORCO) 10-325 MG per tablet Take 0.5 tablets by mouth every 6 (six) hours as needed.   Marland Kitchen HYDROcodone-homatropine (HYCODAN) 5-1.5 MG/5ML syrup Take 5 mLs by mouth every 6 (six) hours as needed.  . NONFORMULARY OR COMPOUNDED ITEM Estradiol .02% 1 ML Prefilled  Applicator Sig: apply vaginally twice a week #90 day supply no refills-overdue for annual exam.  . omega-3 acid ethyl esters (LOVAZA) 1 G capsule Take by mouth 2 (two) times daily.  Marland Kitchen omeprazole (PRILOSEC) 20 MG capsule Take 1 capsule (20 mg total) by mouth 2 (two) times daily before a meal.  . polyethylene glycol (MIRALAX / GLYCOLAX) packet Take 17 g by mouth daily as needed.   . Psyllium (METAMUCIL) 30.9 % POWD Take by mouth. One tsp daily at bedtime   . SYNTHROID 88 MCG tablet TAKE 1 TABLET DAILY  . zolpidem (AMBIEN) 10 MG tablet TAKE 1 TABLET BY MOUTH AT BEDTIME AS NEEDED FOR SLEEP  . [DISCONTINUED] amoxicillin (AMOXIL) 875 MG tablet Take 1 tablet (875 mg total) by mouth 2 (two) times daily.  . [DISCONTINUED] omeprazole (PRILOSEC) 20 MG capsule Take 1 capsule (20 mg total) by mouth daily.  . [DISCONTINUED] omeprazole (PRILOSEC) 20 MG capsule TAKE 1 CAPSULE DAILY MAKE  AN APPOINTMENT FOR FURTHER REFILLS  . saccharomyces boulardii (FLORASTOR) 250 MG capsule Take 1 capsule (250 mg total) by mouth 2 (two) times daily.  . sucralfate (CARAFATE) 1 G tablet Take 1 tablet (1 g total) by mouth 4 (four) times daily -  with meals and at bedtime.   No facility-administered encounter medications on file as of 10/18/2014.   Allergies  Allergen Reactions  . Aspirin     REACTION: Hives   Patient Active Problem List  Diagnosis Date Noted  . Ovarian cyst, right 08/16/2014  . Sore throat 07/27/2013  . NSAID long-term use 07/27/2013  . Acute upper respiratory infections of unspecified site 07/23/2013  . Vertigo 07/23/2013  . Vaginal atrophy 08/07/2012  . Weight gain 07/24/2012  . Postmenopausal HRT (hormone replacement therapy) 07/24/2012  . Abdominal pain, epigastric 08/22/2009  . HYPOTHYROIDISM 07/20/2008   History   Social History  . Marital Status: Married    Spouse Name: N/A  . Number of Children: 1  . Years of Education: N/A   Occupational History  . Retired    Social History Main  Topics  . Smoking status: Former Smoker -- 1.50 packs/day for 40 years    Types: Cigarettes    Quit date: 08/28/2003  . Smokeless tobacco: Never Used  . Alcohol Use: No  . Drug Use: No  . Sexual Activity: Yes    Birth Control/ Protection: Surgical     Comment: HYSTERECTOMY   Other Topics Concern  . Not on file   Social History Narrative    Ms. Steinhoff's family history includes Cancer in her father and mother; Diabetes in her brother, mother, and sister; Heart disease in her brother, daughter, and father; Hypertension in her brother and sister. There is no history of Stomach cancer.      Objective:    Filed Vitals:   10/18/14 0850  BP: 130/66  Pulse: 72    Physical Exam  well-developed older female in no acute distress, pleasant blood pressure 130/66 pulse 72 height 5 foot 3 weight 150. HEENT; nontraumatic normocephalic EOMI PERRLA sclera anicteric, Supple ;no JVD, Cardiovascular; regular rate and rhythm with S1-S2 no murmur or gallop, Pulmonary; clear bilaterally, Abdomen ;soft she is tender in the epigastrium and also has some tenderness along the right anterior costal margin no palpable mass or hepatosplenomegaly bowel sounds are present, Rectal ;exam not done,Ext;no clubbing cyanosis or edema skin warm and dry, Psych; mood and affect appropriate       Assessment & Plan:   #1 69 yo female  With several month hx of burning epigastric pain- suspect NSAID induced gastropathy or PUD.  #2 Costal margin tenderness ? Costochondritis #3 Negative colonoscopy 2014  Plan; Stop Celebrex-try switching to tylenol Increase Prilosec to 20 mg po BID  Add course of carafate 1 gm between meals and hs Add daily probiotic- Florastor BID x one month  Schedule for EGD with Dr . Henrene Pastor - procedure discussed in detail  with pt and she is agreeable to proceed.  If Egd negative would proceed to CT adbomen    Alfredia Ferguson PA-C 10/18/2014   Cc: Eulas Post, MD

## 2014-10-19 ENCOUNTER — Telehealth: Payer: Self-pay | Admitting: Family Medicine

## 2014-10-19 ENCOUNTER — Telehealth: Payer: Self-pay | Admitting: Physician Assistant

## 2014-10-19 MED ORDER — BENZONATATE 200 MG PO CAPS: 200.0000 mg | ORAL_CAPSULE | Freq: Three times a day (TID) | ORAL | Status: DC | PRN

## 2014-10-19 MED ORDER — OMEPRAZOLE 20 MG PO CPDR: 20.0000 mg | DELAYED_RELEASE_CAPSULE | Freq: Two times a day (BID) | ORAL | Status: DC

## 2014-10-19 NOTE — Telephone Encounter (Signed)
Rx sent to pharmacy and pt is aware. 

## 2014-10-19 NOTE — Telephone Encounter (Signed)
Sent 90 day supply to [facility] per patient's request.

## 2014-10-19 NOTE — Addendum Note (Signed)
Addended byMarisue Humble K on: 10/19/2014 02:04 PM   Modules accepted: Orders

## 2014-10-19 NOTE — Telephone Encounter (Signed)
Patient would like to know if Dr. Elease Hashimoto can call her in something for the on-going cough she has? [id number] - [place], [place] - [address]

## 2014-10-19 NOTE — Telephone Encounter (Signed)
Lets try Tessalon Perles 200 mg every 8 hours prn cough #30 with no refill.

## 2014-11-24 ENCOUNTER — Encounter: Payer: Self-pay | Admitting: Internal Medicine

## 2014-11-24 ENCOUNTER — Ambulatory Visit (AMBULATORY_SURGERY_CENTER): Payer: Medicare Other | Admitting: Internal Medicine

## 2014-11-24 VITALS — BP 120/56 | HR 59 | Temp 97.1°F | Resp 16 | Ht 63.0 in | Wt 150.0 lb

## 2014-11-24 DIAGNOSIS — R1013 Epigastric pain: Secondary | ICD-10-CM

## 2014-11-24 DIAGNOSIS — K219 Gastro-esophageal reflux disease without esophagitis: Secondary | ICD-10-CM | POA: Diagnosis not present

## 2014-11-24 DIAGNOSIS — K269 Duodenal ulcer, unspecified as acute or chronic, without hemorrhage or perforation: Secondary | ICD-10-CM | POA: Diagnosis not present

## 2014-11-24 MED ORDER — SODIUM CHLORIDE 0.9 % IV SOLN: 500.0000 mL | INTRAVENOUS | Status: DC

## 2014-11-24 NOTE — Progress Notes (Signed)
Called to room to assist during endoscopic procedure.  Patient ID and intended procedure confirmed with present staff. Received instructions for my participation in the procedure from the performing physician.  

## 2014-11-24 NOTE — Patient Instructions (Signed)
YOU HAD AN ENDOSCOPIC PROCEDURE TODAY AT [facility]:   Refer to the procedure report that was given to you for any specific questions about what was found during the examination.  If the procedure report does not answer your questions, please call your gastroenterologist to clarify.  If you requested that your care partner not be given the details of your procedure findings, then the procedure report has been included in a sealed envelope for you to review at your convenience later.  YOU SHOULD EXPECT: Some feelings of bloating in the abdomen. Passage of more gas than usual.  Walking can help get rid of the air that was put into your GI tract during the procedure and reduce the bloating. If you had a lower endoscopy (such as a colonoscopy or flexible sigmoidoscopy) you may notice spotting of blood in your stool or on the toilet paper. If you underwent a bowel prep for your procedure, you may not have a normal bowel movement for a few days.  Please Note:  You might notice some irritation and congestion in your nose or some drainage.  This is from the oxygen used during your procedure.  There is no need for concern and it should clear up in a day or so.  SYMPTOMS TO REPORT IMMEDIATELY:   Following upper endoscopy (EGD)  Vomiting of blood or coffee ground material  New chest pain or pain under the shoulder blades  Painful or persistently difficult swallowing  New shortness of breath  Fever of 100F or higher  Black, tarry-looking stools  For urgent or emergent issues, a gastroenterologist can be reached at any hour by calling [phone].   DIET: Your first meal following the procedure should be a small meal and then it is ok to progress to your normal diet. Heavy or fried foods are harder to digest and may make you feel nauseous or bloated.  Likewise, meals heavy in dairy and vegetables can increase bloating.  Drink plenty of fluids but you should avoid alcoholic beverages for  24 hours.  ACTIVITY:  You should plan to take it easy for the rest of today and you should NOT DRIVE or use heavy machinery until tomorrow (because of the sedation medicines used during the test).    FOLLOW UP: Our staff will call the number listed on your records the next business day following your procedure to check on you and address any questions or concerns that you may have regarding the information given to you following your procedure. If we do not reach you, we will leave a message.  However, if you are feeling well and you are not experiencing any problems, there is no need to return our call.  We will assume that you have returned to your regular daily activities without incident.  If any biopsies were taken you will be contacted by phone or by letter within the next 1-3 weeks.  Please call us at [phone] if you have not heard about the biopsies in 3 weeks.    SIGNATURES/CONFIDENTIALITY: You and/or your care partner have signed paperwork which will be entered into your electronic medical record.  These signatures attest to the fact that that the information above on your After Visit Summary has been reviewed and is understood.  Full responsibility of the confidentiality of this discharge information lies with you and/or your care-partner.  Continue Omeprazole 20 mg twice daily.  Avoid NSAIDS (celebrex would be a better choice) and no aspirin, ok to  take Tylenol.  Please make follow-up appointment with Dr Henrene Pastor, please call office to set this appointment up.

## 2014-11-24 NOTE — Op Note (Signed)
[place]  [address] [place], [address]   ENDOSCOPY PROCEDURE REPORT  PATIENT: Cassidy Norton, Cassidy Norton  MR#: [id number] BIRTHDATE: 1945-09-18 , 69  yrs. old GENDER: female ENDOSCOPIST: Eustace Quail, MD REFERRED BY:  .  Self / Office PROCEDURE DATE:  11/24/2014 PROCEDURE:  EGD w/ biopsy ASA CLASS:     Class II INDICATIONS:  epigastric pain. MEDICATIONS: Monitored anesthesia care and Propofol 150 mg IV TOPICAL ANESTHETIC: none  DESCRIPTION OF PROCEDURE: After the risks benefits and alternatives of the procedure were thoroughly explained, informed consent was obtained.  The LB [id number] endoscope was introduced through the mouth and advanced to the second portion of the duodenum , Without limitations.  The instrument was slowly withdrawn as the mucosa was fully examined.   EXAM:The esophagus was normal.  The stomach was normal.  Duodenal bulb revealed a shallow ulcer in the distal most portion.  This measured 6 mm.  Also multiple punctate ulcers in the second duodenum.  CLO biopsy taken.  Retroflexed views revealed no abnormalities.     The scope was then withdrawn from the patient and the procedure completed.  COMPLICATIONS: There were no immediate complications.  ENDOSCOPIC IMPRESSION: 1. Duodenal ulcers as described. This explains epigastric pain/burning 2. Otherwise normal EGD, status post CLO biopsy  RECOMMENDATIONS: 1.  Continue omeprazole 20 mg twice daily 2.  Avoid NSAIDS such as low back. Celebrex alone would be a better choice. Do not take with aspirin, however. Okay to supplement with Tylenol for pain 3.  Rx CLO if positive 4.  Please make a follow-up office appointment with Dr. Henrene Pastor  REPEAT EXAM:  eSigned:  Eustace Quail, MD 11/24/2014 4:11 PM    CC:The Patient and Carolann Littler, MD

## 2014-11-24 NOTE — Progress Notes (Signed)
A/ox3, pleased with MAC, report to RN 

## 2014-11-25 ENCOUNTER — Telehealth: Payer: Self-pay | Admitting: *Deleted

## 2014-11-25 NOTE — Telephone Encounter (Signed)
  Follow up Call-  Call back number 11/24/2014 06/12/2012  Post procedure Call Back phone  # [phone]  Permission to leave phone message Yes Yes     Patient questions:  Do you have a fever, pain , or abdominal swelling? No. Pain Score  0 *  Have you tolerated food without any problems? Yes.    Have you been able to return to your normal activities? Yes.    Do you have any questions about your discharge instructions: Diet   No. Medications  No. Follow up visit  No.  Do you have questions or concerns about your Care? No.  Actions: * If pain score is 4 or above: No action needed, pain <4.

## 2014-11-30 LAB — HELICOBACTER PYLORI SCREEN-BIOPSY: UREASE: NEGATIVE

## 2014-12-20 ENCOUNTER — Other Ambulatory Visit: Payer: Self-pay | Admitting: Physician Assistant

## 2014-12-21 ENCOUNTER — Other Ambulatory Visit: Payer: Self-pay

## 2014-12-21 ENCOUNTER — Telehealth: Payer: Self-pay

## 2014-12-21 DIAGNOSIS — K769 Liver disease, unspecified: Secondary | ICD-10-CM

## 2014-12-21 NOTE — Telephone Encounter (Signed)
Pt scheduled for US abdomen at [facility] 12/30/14@9am , pt to arrive there at 8:45am. Pt to be NPO after midnight. Pt aware of appt.

## 2014-12-21 NOTE — Telephone Encounter (Signed)
-----   Message from Hulan Saas, RN sent at 06/22/2014 11:42 AM EST ----- Needs 6 month f/u ultrasound of abdomen to f/u on liver lesion per JP.

## 2014-12-30 ENCOUNTER — Ambulatory Visit (HOSPITAL_COMMUNITY)
Admission: RE | Admit: 2014-12-30 | Discharge: 2014-12-30 | Disposition: A | Discharge status: Home / Self Care | Payer: Medicare Other | Source: Ambulatory Visit | Attending: Internal Medicine | Admitting: Internal Medicine

## 2014-12-30 DIAGNOSIS — K769 Liver disease, unspecified: Secondary | ICD-10-CM

## 2014-12-30 DIAGNOSIS — K7689 Other specified diseases of liver: Secondary | ICD-10-CM | POA: Diagnosis not present

## 2014-12-31 ENCOUNTER — Telehealth: Payer: Self-pay | Admitting: Internal Medicine

## 2014-12-31 NOTE — Telephone Encounter (Signed)
Spoke with pt and she is aware.

## 2014-12-31 NOTE — Telephone Encounter (Signed)
Pt has OV scheduled for follow-up post EGD. Pt wants to know if she needs to keep this appt. Please advise.

## 2014-12-31 NOTE — Telephone Encounter (Signed)
Yes, follow-up treatment of the abnormalities on EGD. About 8 weeks would be fine. Thanks

## 2015-01-12 DIAGNOSIS — M5136 Other intervertebral disc degeneration, lumbar region: Secondary | ICD-10-CM | POA: Diagnosis not present

## 2015-01-12 DIAGNOSIS — M961 Postlaminectomy syndrome, not elsewhere classified: Secondary | ICD-10-CM | POA: Diagnosis not present

## 2015-01-12 DIAGNOSIS — M542 Cervicalgia: Secondary | ICD-10-CM | POA: Diagnosis not present

## 2015-01-21 ENCOUNTER — Ambulatory Visit (INDEPENDENT_AMBULATORY_CARE_PROVIDER_SITE_OTHER): Payer: Medicare Other | Admitting: Internal Medicine

## 2015-01-21 ENCOUNTER — Encounter: Payer: Self-pay | Admitting: Internal Medicine

## 2015-01-21 VITALS — BP 122/68 | HR 62 | Ht 63.0 in | Wt 148.6 lb

## 2015-01-21 DIAGNOSIS — K269 Duodenal ulcer, unspecified as acute or chronic, without hemorrhage or perforation: Secondary | ICD-10-CM

## 2015-01-21 DIAGNOSIS — R1013 Epigastric pain: Secondary | ICD-10-CM | POA: Diagnosis not present

## 2015-01-21 DIAGNOSIS — K219 Gastro-esophageal reflux disease without esophagitis: Secondary | ICD-10-CM | POA: Diagnosis not present

## 2015-01-21 DIAGNOSIS — K5909 Other constipation: Secondary | ICD-10-CM | POA: Diagnosis not present

## 2015-01-21 DIAGNOSIS — Z86010 Personal history of colonic polyps: Secondary | ICD-10-CM

## 2015-01-21 NOTE — Patient Instructions (Signed)
Please follow up as needed 

## 2015-01-21 NOTE — Progress Notes (Signed)
HISTORY OF PRESENT ILLNESS:  Cassidy Norton is a 69 y.o. female with past medical history as listed below. She has history of chronic GERD, chronic constipation, and adenomatous colon polyps. She was most recently evaluated in this office by the GI physician assistant 10/18/2014 regarding a several month history of burning epigastric pain. The patient had been using NSAIDs. Her omeprazole was increased from 20 mg daily to 20 mg twice daily. She was asked to stop NSAIDs (had been on mobile and Celebrex). She was scheduled for upper endoscopy which was performed 11/24/2014. Patient was found to have multiple shallow duodenal ulcers in the distalmost portion of the bulb. No other abnormalities. Testing for Helicobacter pylori was negative. She was advised to use Tylenol for pain, avoid NSAIDs, and continue on twice a day PPI. She presents today for follow-up. She is pleased report that her abdominal pain has subsided. She is doing well with 2 extra strength Tylenol twice daily. She does not use alcohol. She does complain today of somewhat worsening constipation. MiraLAX is helpful, though she only uses once monthly. She inquires about using MiraLAX more frequently. Her last colonoscopy was in 2014. No polyps. Patient's GI review of systems is otherwise negative  REVIEW OF SYSTEMS:  All non-GI ROS negative except for arthritis, back pain  Past Medical History  Diagnosis Date  . HYPOTHYROIDISM 07/20/2008  . Abdominal pain, epigastric 08/22/2009  . Chronic low back pain   . GERD (gastroesophageal reflux disease)   . Diverticulosis   . Hemorrhoids   . Adenomatous colon polyp   . NSVD (normal spontaneous vaginal delivery)     X2  . Duodenal ulcer     Past Surgical History  Procedure Laterality Date  . Cholecystectomy    . Blepharoplasty    . Dilation and curettage of uterus    . Abdominal hysterectomy      TVH  . Eye surgery  2008    BILATERAL CATARACT   . Back surgery      RUPTURED DISC  .  Colonoscopy  JAN 2014    NEXT COLONOSCOPY IN 10 YEARS    Social History Cassidy Norton  reports that she quit smoking about 11 years ago. Her smoking use included Cigarettes. She has a 60 pack-year smoking history. She has never used smokeless tobacco. She reports that she does not drink alcohol or use illicit drugs.  family history includes Cancer in her father and mother; Diabetes in her brother, mother, and sister; Heart disease in her brother, daughter, and father; Hypertension in her brother and sister. There is no history of Stomach cancer.  Allergies  Allergen Reactions  . Aspirin     REACTION: Hives       PHYSICAL EXAMINATION: Vital signs: BP 122/68 mmHg  Pulse 62  Ht 5\' 3"  (1.6 m)  Wt 148 lb 9.6 oz (67.405 kg)  BMI 26.33 kg/m2 General: Well-developed, well-nourished, no acute distress HEENT: Sclerae are anicteric, conjunctiva pink. Oral mucosa intact Lungs: Clear Heart: Regular Abdomen: soft, nontender, nondistended, no obvious ascites, no peritoneal signs, normal bowel sounds. No organomegaly. Extremities: No clubbing cyanosis or edema Psychiatric: alert and oriented x3. Cooperative   ASSESSMENT:  #1. Epigastric pain found secondary to acute duodenal ulcer disease. Ulcer secondary to NSAIDs. Improved off NSAIDs and on PPI #2. GERD. No active symptoms on PPI #3. Chronic constipation with recent exacerbation. #4. History of adenomatous colon polyps. No neoplasia on most recent exam in 2014   PLAN:  #1. Continue twice  a day PPI #2. Continue to avoid NSAIDs #3. Tylenol as needed for pain not to exceed recommended dosage #4. Advised to increase MiraLAX frequency. We discussed in detail penetration techniques. She understands #5. Surveillance colonoscopy 2024 #6. GI follow-up in 1 year. Sooner if needed  20 minutes was spent face-to-face with the patient. Greater than 50% of the evaluation was used for counseling regarding her various GI diagnoses and plans as  outlined above

## 2015-02-15 DIAGNOSIS — L728 Other follicular cysts of the skin and subcutaneous tissue: Secondary | ICD-10-CM | POA: Diagnosis not present

## 2015-02-15 DIAGNOSIS — D239 Other benign neoplasm of skin, unspecified: Secondary | ICD-10-CM | POA: Diagnosis not present

## 2015-03-08 ENCOUNTER — Other Ambulatory Visit: Payer: Self-pay | Admitting: Physician Assistant

## 2015-04-12 ENCOUNTER — Ambulatory Visit (INDEPENDENT_AMBULATORY_CARE_PROVIDER_SITE_OTHER): Payer: Medicare Other | Admitting: Family Medicine

## 2015-04-12 VITALS — BP 110/70 | HR 65 | Temp 97.8°F | Resp 14 | Ht 63.0 in | Wt 146.7 lb

## 2015-04-12 DIAGNOSIS — Z0000 Encounter for general adult medical examination without abnormal findings: Secondary | ICD-10-CM

## 2015-04-12 DIAGNOSIS — H578 Other specified disorders of eye and adnexa: Secondary | ICD-10-CM

## 2015-04-12 DIAGNOSIS — E039 Hypothyroidism, unspecified: Secondary | ICD-10-CM | POA: Diagnosis not present

## 2015-04-12 DIAGNOSIS — K219 Gastro-esophageal reflux disease without esophagitis: Secondary | ICD-10-CM | POA: Diagnosis not present

## 2015-04-12 DIAGNOSIS — Z23 Encounter for immunization: Secondary | ICD-10-CM

## 2015-04-12 DIAGNOSIS — H5789 Other specified disorders of eye and adnexa: Secondary | ICD-10-CM

## 2015-04-12 LAB — TSH: TSH: 2.47 u[IU]/mL (ref 0.35–4.50)

## 2015-04-12 NOTE — Progress Notes (Signed)
Subjective:    [name], female    DOB: 1945-05-26, 70 y.o.   MRN: [id number]  HPI Patient seen for Medicare wellness subsequent visit and medical follow-up.  She has history of hypothyroidism on replacement. She has history of GERD and previous dyspepsia. She remains on Carafate and omeprazole and GI symptoms are stable. She continues to be followed by gynecologist. She had mammogram and DEXA scan during the past year. She remains on estrogen replacement and has hot flashes each time she tries to stop  She needs flu vaccine and also Pneumovax. Compliant with thyroid medication. Due for labs. Denies any cold intolerance or any significant change in appetite or weight changes.  Denies any dysphagia. No abdominal pain.  Acute new problem of right eye irritation. Yesterday she was mowing and getting up leaves and felt some irritation. No contact use. She has some mild tearing this morning. No blurred vision. Poorly localized mild pain. No redness or drainage other than some clear tearing  Past Medical History  Diagnosis Date  . HYPOTHYROIDISM 07/20/2008  . Abdominal pain, epigastric 08/22/2009  . Chronic low back pain   . GERD (gastroesophageal reflux disease)   . Diverticulosis   . Hemorrhoids   . Adenomatous colon polyp   . NSVD (normal spontaneous vaginal delivery)     X2  . Duodenal ulcer    Past Surgical History  Procedure Laterality Date  . Cholecystectomy    . Blepharoplasty    . Dilation and curettage of uterus    . Abdominal hysterectomy      TVH  . Eye surgery  2008    BILATERAL CATARACT   . Back surgery      RUPTURED DISC  . Colonoscopy  JAN 2014    NEXT COLONOSCOPY IN 10 YEARS    reports that she quit smoking about 11 years ago. Her smoking use included Cigarettes. She has a 60 pack-year smoking history. She has never used smokeless tobacco. She reports that she does not drink alcohol or use illicit drugs. family history includes Cancer in her  father and mother; Diabetes in her brother, mother, and sister; Heart disease in her brother, daughter, and father; Hypertension in her brother and sister. There is no history of Stomach cancer. Allergies  Allergen Reactions  . Aspirin     REACTION: Hives   1.  Risk factors based on Past Medical , Social, and Family history reviewed and as indicated above with no changes 2.  Limitations in physical activities None.  No recent falls. 3.  Depression/mood No active depression or anxiety issues 4.  Hearing No defiits 5.  ADLs independent in all. 6.  Cognitive function (orientation to time and place, language, writing, speech,memory) no short or long term memory issues.  Language and judgement intact. 7.  Home Safety no issues 8.  Height, weight, and visual acuity.all stable. 9.  Counseling discussed regular calcium and vitamin D intake and regular weightbearing exercise. 10. Recommendation of preventive services. Flu vaccine and Pneumovax 11. Labs based on risk factors TSH 12. Care Plan as above 13. Other Providers Dr. Mollie Germany,, Dr. Kathrine Cords 14. Written schedule of screening/prevention services given to patient.     Review of Systems  Constitutional: Negative for fever, activity change, appetite change, fatigue and unexpected weight change.  HENT: Negative for ear pain, hearing loss, sore throat and trouble swallowing.   Eyes: Positive for pain and discharge (clear watery). Negative for redness and visual disturbance.  Respiratory: Negative  for cough and shortness of breath.   Cardiovascular: Negative for chest pain and palpitations.  Gastrointestinal: Negative for abdominal pain, diarrhea, constipation and blood in stool.  Genitourinary: Negative for dysuria and hematuria.  Musculoskeletal: Negative for myalgias, back pain and arthralgias.  Skin: Negative for rash.  Neurological: Negative for dizziness, syncope and headaches.  Hematological: Negative for adenopathy.    Psychiatric/Behavioral: Negative for confusion and dysphoric mood.       Objective:   Physical Exam  Constitutional: She is oriented to person, place, and time. She appears well-developed and well-nourished.  HENT:  Head: Normocephalic and atraumatic.  Eyes: EOM are normal. Pupils are equal, round, and reactive to light.  Right eye- no visible foreign body. No redness. Fluoroscein stain applied. No evidence for corneal abrasion or foreign body noted  Neck: Normal range of motion. Neck supple. No thyromegaly present.  Cardiovascular: Normal rate, regular rhythm and normal heart sounds.   No murmur heard. Pulmonary/Chest: Breath sounds normal. No respiratory distress. She has no wheezes. She has no rales.  Abdominal: Soft. Bowel sounds are normal. She exhibits no distension and no mass. There is no tenderness. There is no rebound and no guarding.  Musculoskeletal: Normal range of motion. She exhibits no edema.  Lymphadenopathy:    She has no cervical adenopathy.  Neurological: She is alert and oriented to person, place, and time. She displays normal reflexes. No cranial nerve deficit.  Skin: No rash noted.  Psychiatric: She has a normal mood and affect. Her behavior is normal. Judgment and thought content normal.          Assessment & Plan:  #1 Medicare subsequent wellness visit. Flu vaccine given. Pneumovax given. Discussed regular weightbearing exercise and regular calcium and vitamin D. She will check on coverage for shingles vaccine. She will continue GYN follow-up #2 hypothyroidism. Check TSH. Adjust levothyroxine accordingly #3 right eye irritation. No foreign body noted with staining as above. Continue irrigation. Follow-up for any blurred vision or other concerns #4 history of GERD. Stable on omeprazole and Carafate

## 2015-04-12 NOTE — Patient Instructions (Signed)
Health Maintenance  Topic Date Due  . Hepatitis C Screening  07-07-1945  . ZOSTAVAX  08/11/2003  . PAP SMEAR  07/21/2011  . PNA vac Low Risk Adult (2 of 2 - PPSV23) 09/22/2014  . INFLUENZA VACCINE  12/13/2014  . MAMMOGRAM  08/05/2015  . COLONOSCOPY  06/12/2022  . TETANUS/TDAP  07/25/2022  . DEXA SCAN  Completed    Check on coverage for shingles vaccine if interested.

## 2015-04-12 NOTE — Progress Notes (Signed)
Pre visit review using our clinic review tool, if applicable. No additional management support is needed unless otherwise documented below in the visit note. 

## 2015-04-19 DIAGNOSIS — T1501XA Foreign body in cornea, right eye, initial encounter: Secondary | ICD-10-CM | POA: Diagnosis not present

## 2015-04-19 DIAGNOSIS — H02052 Trichiasis without entropian right lower eyelid: Secondary | ICD-10-CM | POA: Diagnosis not present

## 2015-04-19 DIAGNOSIS — T1500XA Foreign body in cornea, unspecified eye, initial encounter: Secondary | ICD-10-CM | POA: Diagnosis not present

## 2015-04-19 DIAGNOSIS — H02053 Trichiasis without entropian right eye, unspecified eyelid: Secondary | ICD-10-CM | POA: Diagnosis not present

## 2015-04-25 IMAGING — US US ABDOMEN COMPLETE
1 series · 13 of 25 positions shown · non-contrast
Comparison: None.

CLINICAL DATA: Right upper quadrant burning sensation for the past
2 months, history of previous cholecystectomy.

EXAM:
ULTRASOUND ABDOMEN COMPLETE

[Series 1: us abdomen complete · 0.25mm/px · 13 of 79 slices shown]
[im 1/79]
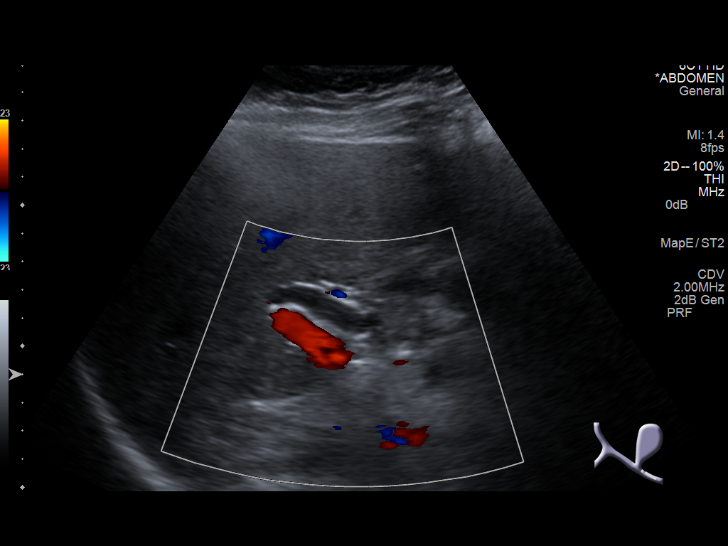
[im 7/79]
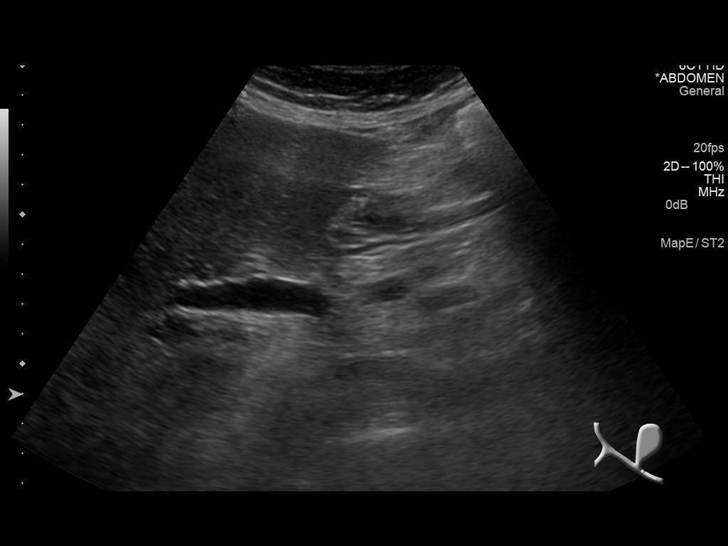
[im 14/79]
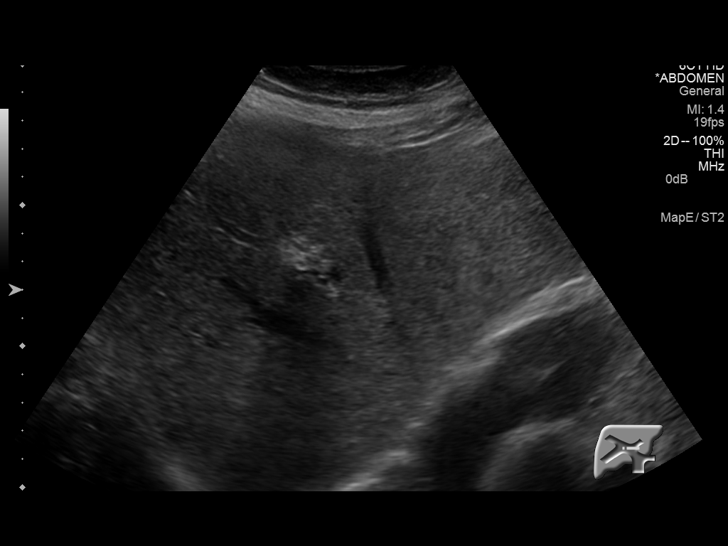
[im 20/79]
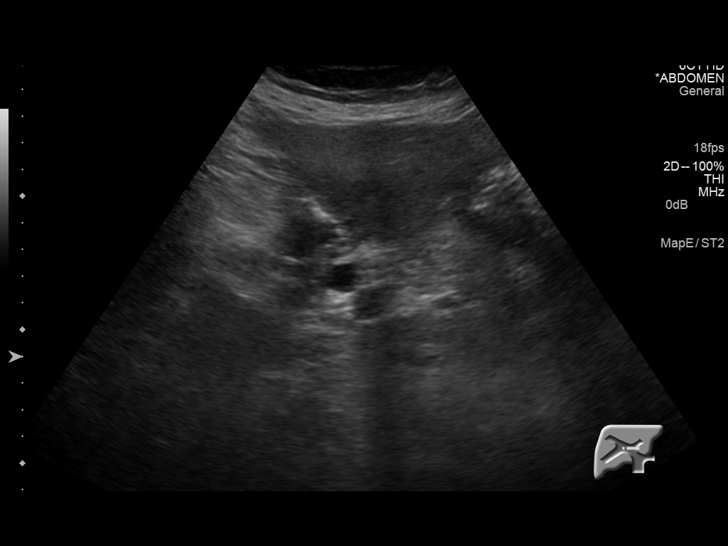
[im 27/79]
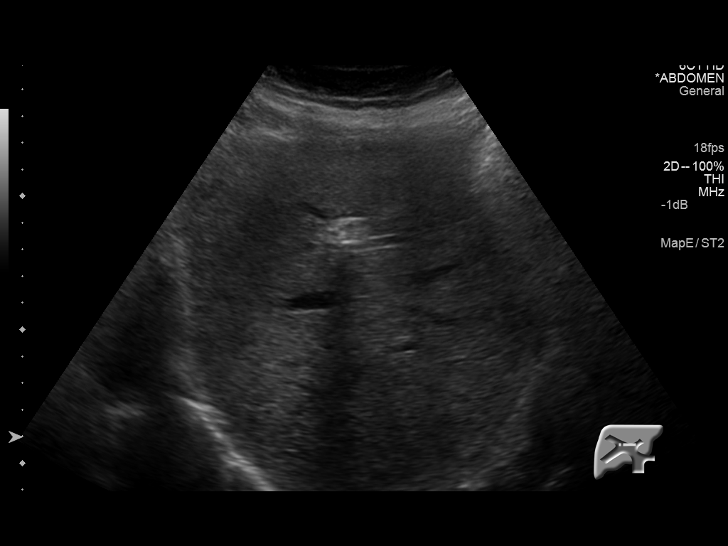
[im 33/79]
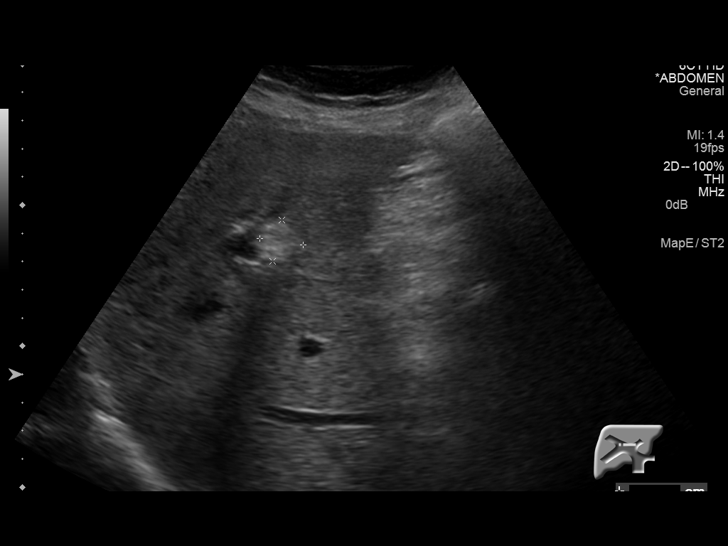
[im 40/79]
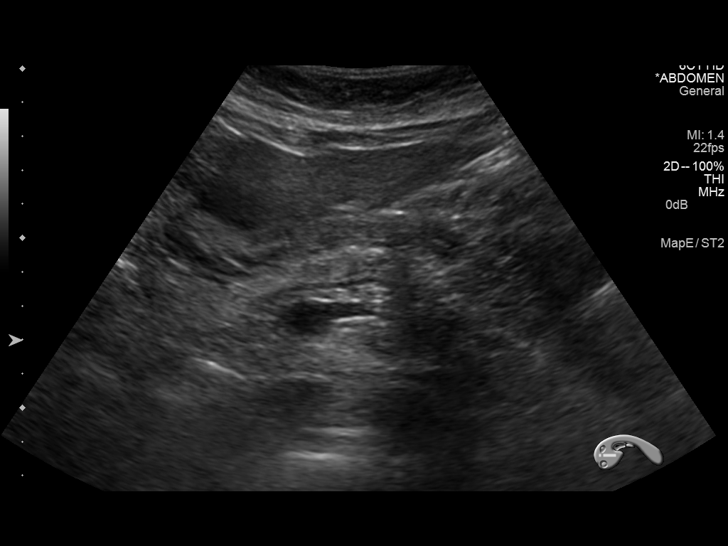
[im 46/79]
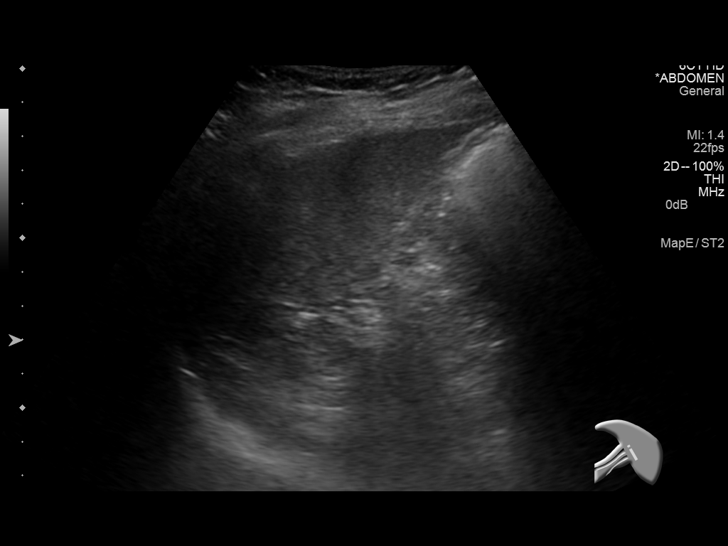
[im 53/79]
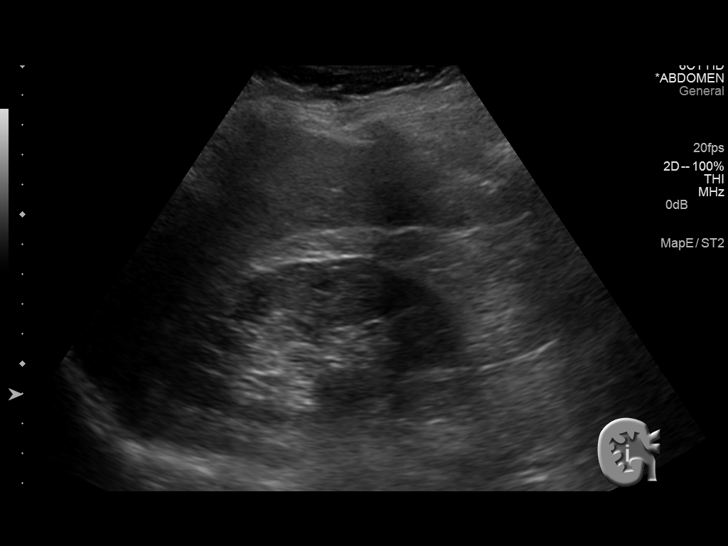
[im 59/79]
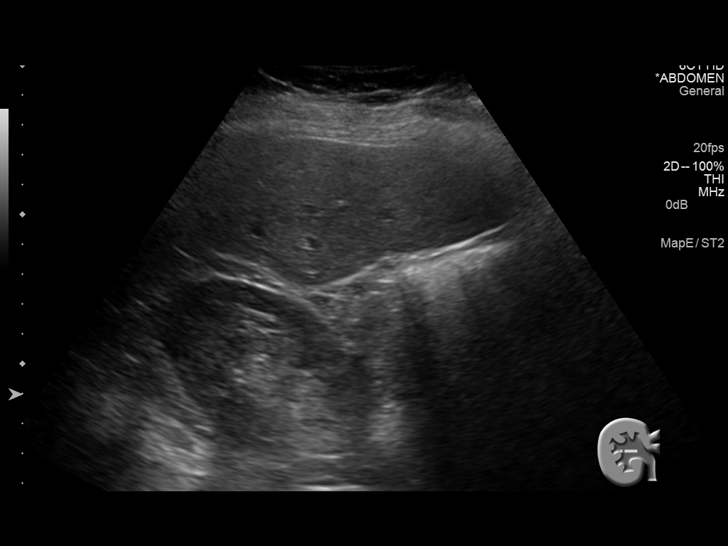
[im 66/79]
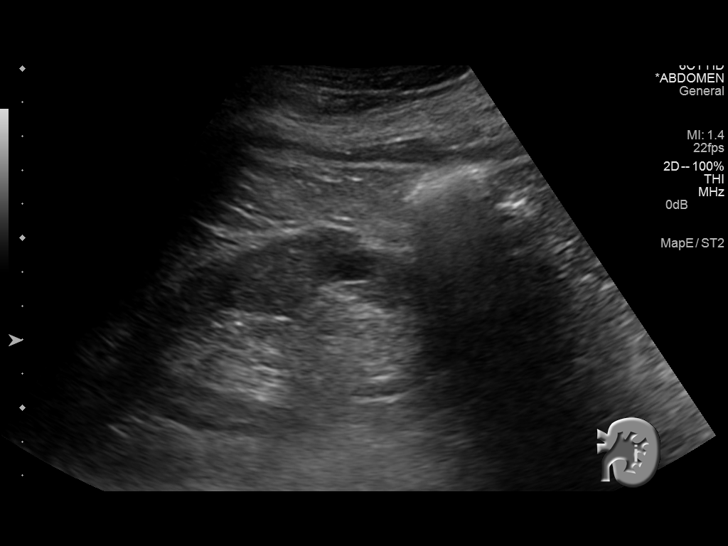
[im 72/79]
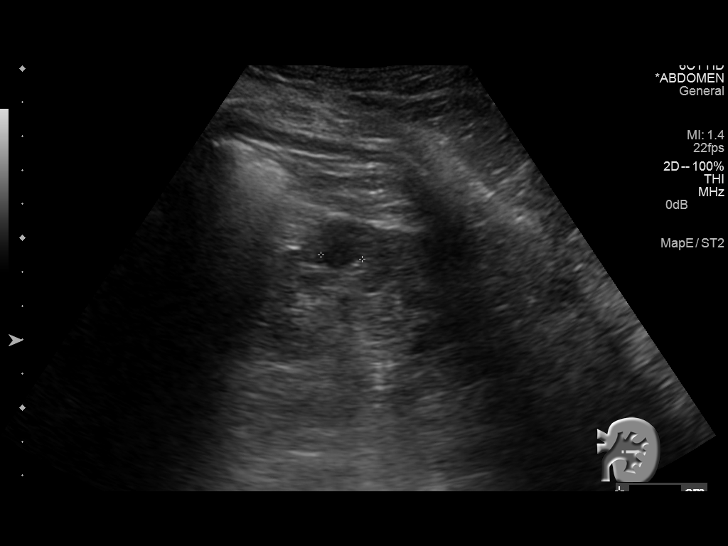
[im 79/79]
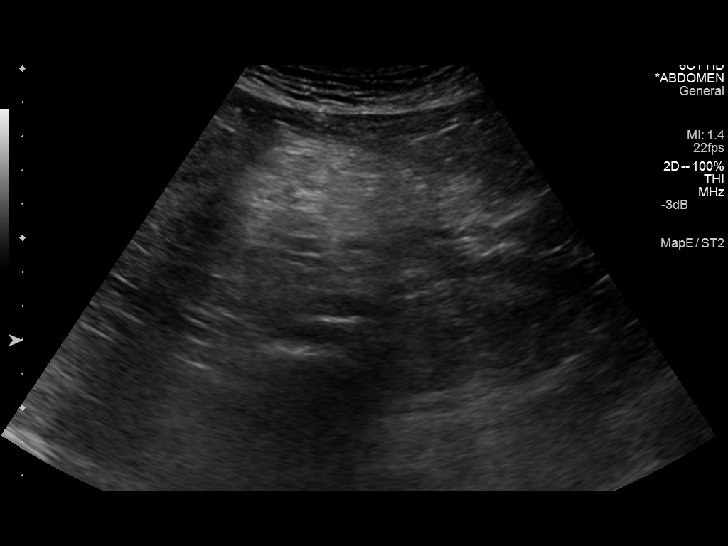

[13 of 25 positions shown; findings below may reference images not displayed]

FINDINGS: Gallbladder: The gallbladder is surgically absent.

Common bile duct: Diameter: The common bile duct measures as much as
11 mm at the level of the pancreatic head. No common bile duct stone
is demonstrated.

Liver: There is a hyperechoic focus within the liver measuring
cm in greatest dimension. Elsewhere the hepatic parenchyma is
normal. There is no intrahepatic ductal dilation.

IVC: No abnormality visualized.

Pancreas: Visualized portion unremarkable. The pancreatic duct
measures 1.9 mm in diameter.

Spleen: Size and appearance within normal limits.

Right Kidney: Length: 10.8 cm. Echogenicity within normal limits. No
mass or hydronephrosis visualized.

Left Kidney: Length: 11.7 cm. There is a midpole cyst measuring
cm in greatest dimension. There is no hydronephrosis.

Abdominal aorta: No aneurysm visualized.

Other findings: No ascites is demonstrated.
IMPRESSION: 1. A 1.8 cm diameter hyperechoic focus in the liver is consistent
with a hemangioma. In the absence of any history of malignancy, a
six-month follow-up ultrasound of the liver is recommended to assure
stability.
2. Common bile duct dilation is consistent with the post
cholecystectomy state.
3. There is no acute abnormality demonstrated elsewhere.

## 2015-04-27 DIAGNOSIS — H1859 Other hereditary corneal dystrophies: Secondary | ICD-10-CM | POA: Diagnosis not present

## 2015-04-27 DIAGNOSIS — T1500XA Foreign body in cornea, unspecified eye, initial encounter: Secondary | ICD-10-CM | POA: Diagnosis not present

## 2015-04-27 DIAGNOSIS — H02053 Trichiasis without entropian right eye, unspecified eyelid: Secondary | ICD-10-CM | POA: Diagnosis not present

## 2015-04-27 DIAGNOSIS — H04123 Dry eye syndrome of bilateral lacrimal glands: Secondary | ICD-10-CM | POA: Diagnosis not present

## 2015-05-11 ENCOUNTER — Encounter: Payer: Self-pay | Admitting: Gynecology

## 2015-05-11 ENCOUNTER — Other Ambulatory Visit: Payer: Self-pay | Admitting: Gynecology

## 2015-05-11 NOTE — Telephone Encounter (Signed)
Called into pharmacy

## 2015-05-12 DIAGNOSIS — M961 Postlaminectomy syndrome, not elsewhere classified: Secondary | ICD-10-CM | POA: Diagnosis not present

## 2015-05-12 DIAGNOSIS — Z79891 Long term (current) use of opiate analgesic: Secondary | ICD-10-CM | POA: Diagnosis not present

## 2015-05-12 DIAGNOSIS — M542 Cervicalgia: Secondary | ICD-10-CM | POA: Diagnosis not present

## 2015-05-12 DIAGNOSIS — M5136 Other intervertebral disc degeneration, lumbar region: Secondary | ICD-10-CM | POA: Diagnosis not present

## 2015-05-25 ENCOUNTER — Other Ambulatory Visit: Payer: Self-pay | Admitting: *Deleted

## 2015-05-25 MED ORDER — LEVOTHYROXINE SODIUM 88 MCG PO TABS: 88.0000 ug | ORAL_TABLET | Freq: Every day | ORAL | Status: DC

## 2015-05-25 NOTE — Telephone Encounter (Signed)
Rx done. 

## 2015-06-02 ENCOUNTER — Ambulatory Visit (INDEPENDENT_AMBULATORY_CARE_PROVIDER_SITE_OTHER): Payer: Medicare Other | Admitting: Family Medicine

## 2015-06-02 ENCOUNTER — Encounter: Payer: Self-pay | Admitting: Family Medicine

## 2015-06-02 VITALS — BP 130/66 | HR 60 | Temp 98.1°F | Ht 63.0 in | Wt 145.0 lb

## 2015-06-02 DIAGNOSIS — J019 Acute sinusitis, unspecified: Secondary | ICD-10-CM

## 2015-06-02 MED ORDER — AZITHROMYCIN 250 MG PO TABS: ORAL_TABLET | ORAL | Status: DC

## 2015-06-02 NOTE — Progress Notes (Signed)
   Subjective:    [name], female    DOB: Jan 02, 1946, 70 y.o.   MRN: [id number]  HPI Here for 3 days of sinus pressure, ear pains, dizziness, PND, and a ST. No cough. Using Cepacol and drinking fluids.    Review of Systems  Constitutional: Negative.   HENT: Positive for congestion, ear pain, postnasal drip, sinus pressure and sore throat.   Eyes: Negative.   Respiratory: Negative.        Objective:   Physical Exam  Constitutional: She appears well-developed and well-nourished.  HENT:  Right Ear: External ear normal.  Left Ear: External ear normal.  Nose: Nose normal.  Mouth/Throat: Oropharynx is clear and moist.  Eyes: Conjunctivae are normal.  Neck: No thyromegaly present.  Pulmonary/Chest: Effort normal and breath sounds normal.  Lymphadenopathy:    She has no cervical adenopathy.          Assessment & Plan:  Sinusitis, treat with a Zpack and Mucinex

## 2015-06-02 NOTE — Progress Notes (Signed)
Pre visit review using our clinic review tool, if applicable. No additional management support is needed unless otherwise documented below in the visit note. 

## 2015-06-16 ENCOUNTER — Ambulatory Visit (INDEPENDENT_AMBULATORY_CARE_PROVIDER_SITE_OTHER): Payer: Medicare Other | Admitting: Family Medicine

## 2015-06-16 VITALS — BP 130/70 | HR 75 | Temp 97.6°F | Ht 63.0 in | Wt 147.3 lb

## 2015-06-16 DIAGNOSIS — T700XXA Otitic barotrauma, initial encounter: Secondary | ICD-10-CM | POA: Diagnosis not present

## 2015-06-16 MED ORDER — PREDNISONE 10 MG PO TABS: ORAL_TABLET | ORAL | Status: DC

## 2015-06-16 NOTE — Patient Instructions (Signed)

## 2015-06-16 NOTE — Progress Notes (Signed)
   Subjective:    [name], female    DOB: 09/14/1945, 70 y.o.   MRN: [id number]  HPI  acute visit. Patient was seen back in January with some sinus congestive symptoms. She was treated Zithromax. She also took some Mucinex. She did not see much improvement. She denies any facial pain. She has some persistent sore throat symptoms intermittently along with intermittent bifrontal headaches. She's not had any yellowish or greenish nasal discharge. No fever. Occasional chills. She mostly is complaining of bilateral ear "pressure ". No hearing changes.  Past Medical History  Diagnosis Date  . HYPOTHYROIDISM 07/20/2008  . Abdominal pain, epigastric 08/22/2009  . Chronic low back pain   . GERD (gastroesophageal reflux disease)   . Diverticulosis   . Hemorrhoids   . Adenomatous colon polyp   . NSVD (normal spontaneous vaginal delivery)     X2  . Duodenal ulcer   . Insomnia    Past Surgical History  Procedure Laterality Date  . Cholecystectomy    . Blepharoplasty    . Dilation and curettage of uterus    . Abdominal hysterectomy      TVH  . Eye surgery  2008    BILATERAL CATARACT   . Back surgery      RUPTURED DISC  . Colonoscopy  JAN 2014    NEXT COLONOSCOPY IN 10 YEARS    reports that she quit smoking about 11 years ago. Her smoking use included Cigarettes. She has a 60 pack-year smoking history. She has never used smokeless tobacco. She reports that she does not drink alcohol or use illicit drugs. family history includes Cancer in her father and mother; Diabetes in her brother, mother, and sister; Heart disease in her brother, daughter, and father; Hypertension in her brother and sister. There is no history of Stomach cancer. Allergies  Allergen Reactions  . Aspirin     REACTION: Hives      Review of Systems  Constitutional: Negative for fever.  HENT: Positive for sinus pressure and sore throat.   Respiratory: Positive for cough.   Neurological: Positive for  headaches.       Objective:   Physical Exam  Constitutional: She appears well-developed and well-nourished.  HENT:  Right Ear: External ear normal.  Left Ear: External ear normal.  Mouth/Throat: Oropharynx is clear and moist.  Neck: Neck supple.  Cardiovascular: Normal rate and regular rhythm.   Pulmonary/Chest: Effort normal and breath sounds normal. No respiratory distress. She has no wheezes. She has no rales.  Lymphadenopathy:    She has no cervical adenopathy.          Assessment & Plan:   Probable eustachian tube dysfunction. Non-focal exam. Recommend prednisone taper over the next week. Follow-up for any fever or change of symptoms not improving

## 2015-06-16 NOTE — Progress Notes (Signed)
Pre visit review using our clinic review tool, if applicable. No additional management support is needed unless otherwise documented below in the visit note. 

## 2015-07-21 ENCOUNTER — Encounter: Payer: Self-pay | Admitting: Podiatry

## 2015-07-21 ENCOUNTER — Ambulatory Visit (INDEPENDENT_AMBULATORY_CARE_PROVIDER_SITE_OTHER): Payer: Medicare Other

## 2015-07-21 ENCOUNTER — Ambulatory Visit (INDEPENDENT_AMBULATORY_CARE_PROVIDER_SITE_OTHER): Payer: Medicare Other | Admitting: Podiatry

## 2015-07-21 VITALS — BP 128/67 | HR 72 | Resp 12

## 2015-07-21 DIAGNOSIS — G629 Polyneuropathy, unspecified: Secondary | ICD-10-CM

## 2015-07-21 DIAGNOSIS — M779 Enthesopathy, unspecified: Secondary | ICD-10-CM | POA: Diagnosis not present

## 2015-07-21 MED ORDER — GABAPENTIN 100 MG PO CAPS: 100.0000 mg | ORAL_CAPSULE | Freq: Three times a day (TID) | ORAL | Status: DC

## 2015-07-21 NOTE — Progress Notes (Signed)
   Subjective:    [name], female    DOB: 1945-11-04, 70 y.o.   MRN: [id number]  HPI: She presents today chief complaint of pain and burning with numbness to the ball of her foot bilaterally. She states has been going on for a while now and seems to be getting worse. She denies any trauma denies any history of diabetes.    Review of Systems  All other systems reviewed and are negative.      Objective:   Physical Exam:: Vital signs are stable alert and oriented 3 pulses are palpable neurologic sensorium is intact. Deep tendon reflexes are intact. Muscle strength is normal. Orthopedic evaluation demonstrates mild hammertoe deformities bilateral. Cutaneous evaluation of straight supple well-hydrated acute is no erythema edema cellulitis drainage or odor.        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  Plan: Started her on gabapentin 100 mg by mouth 3 times a day. Follow up with her in 1 month

## 2015-08-05 DIAGNOSIS — H02051 Trichiasis without entropian right upper eyelid: Secondary | ICD-10-CM | POA: Diagnosis not present

## 2015-08-05 DIAGNOSIS — H35363 Drusen (degenerative) of macula, bilateral: Secondary | ICD-10-CM | POA: Diagnosis not present

## 2015-08-05 DIAGNOSIS — Z961 Presence of intraocular lens: Secondary | ICD-10-CM | POA: Diagnosis not present

## 2015-08-05 DIAGNOSIS — H35033 Hypertensive retinopathy, bilateral: Secondary | ICD-10-CM | POA: Diagnosis not present

## 2015-08-05 DIAGNOSIS — H02052 Trichiasis without entropian right lower eyelid: Secondary | ICD-10-CM | POA: Diagnosis not present

## 2015-08-05 DIAGNOSIS — H26493 Other secondary cataract, bilateral: Secondary | ICD-10-CM | POA: Diagnosis not present

## 2015-08-08 DIAGNOSIS — Z1231 Encounter for screening mammogram for malignant neoplasm of breast: Secondary | ICD-10-CM | POA: Diagnosis not present

## 2015-08-11 ENCOUNTER — Encounter: Payer: Medicare Other | Admitting: Gynecology

## 2015-08-16 ENCOUNTER — Encounter: Payer: Self-pay | Admitting: Gynecology

## 2015-09-01 DIAGNOSIS — M5136 Other intervertebral disc degeneration, lumbar region: Secondary | ICD-10-CM | POA: Diagnosis not present

## 2015-09-01 DIAGNOSIS — M961 Postlaminectomy syndrome, not elsewhere classified: Secondary | ICD-10-CM | POA: Diagnosis not present

## 2015-09-01 DIAGNOSIS — Z79891 Long term (current) use of opiate analgesic: Secondary | ICD-10-CM | POA: Diagnosis not present

## 2015-09-13 ENCOUNTER — Encounter: Payer: Self-pay | Admitting: Gynecology

## 2015-09-13 ENCOUNTER — Ambulatory Visit (INDEPENDENT_AMBULATORY_CARE_PROVIDER_SITE_OTHER): Payer: Medicare Other | Admitting: Gynecology

## 2015-09-13 VITALS — BP 124/76 | Ht 63.0 in | Wt 147.0 lb

## 2015-09-13 DIAGNOSIS — M940 Chondrocostal junction syndrome [Tietze]: Secondary | ICD-10-CM | POA: Diagnosis not present

## 2015-09-13 DIAGNOSIS — M8580 Other specified disorders of bone density and structure, unspecified site: Secondary | ICD-10-CM | POA: Diagnosis not present

## 2015-09-13 DIAGNOSIS — Z01419 Encounter for gynecological examination (general) (routine) without abnormal findings: Secondary | ICD-10-CM | POA: Diagnosis not present

## 2015-09-13 MED ORDER — ESTRADIOL 0.5 MG PO TABS: 0.5000 mg | ORAL_TABLET | Freq: Every day | ORAL | Status: DC
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
## 2015-09-13 NOTE — Patient Instructions (Signed)

## 2015-09-16 ENCOUNTER — Other Ambulatory Visit: Payer: Self-pay | Admitting: Physician Assistant

## 2015-09-16 NOTE — Addendum Note (Signed)
Addended by: Terrance Mass on: 09/16/2015 03:22 PM   Modules accepted: Level of Service

## 2015-09-30 ENCOUNTER — Ambulatory Visit (INDEPENDENT_AMBULATORY_CARE_PROVIDER_SITE_OTHER): Payer: Medicare Other | Admitting: Family Medicine

## 2015-09-30 VITALS — BP 120/80 | HR 89 | Temp 97.8°F | Ht 63.0 in | Wt 146.7 lb

## 2015-09-30 DIAGNOSIS — J069 Acute upper respiratory infection, unspecified: Secondary | ICD-10-CM | POA: Diagnosis not present

## 2015-09-30 DIAGNOSIS — B9789 Other viral agents as the cause of diseases classified elsewhere: Principal | ICD-10-CM

## 2015-09-30 MED ORDER — HYDROCODONE-HOMATROPINE 5-1.5 MG/5ML PO SYRP: 5.0000 mL | ORAL_SOLUTION | Freq: Four times a day (QID) | ORAL | Status: AC | PRN

## 2015-09-30 NOTE — Progress Notes (Signed)
   Subjective:    [name], female    DOB: 14-Sep-1945, 70 y.o.   MRN: [id number]  HPI  Acute illness Onset 2 days ago. Cough, laryngitis, sore throat, body aches No fever.  Positive chills.  No nausea or vomiting.   Cough severe at times.  Mostly nonproductive Took Benadryl allergy with no relief.  Past Medical History  Diagnosis Date  . HYPOTHYROIDISM 07/20/2008  . Abdominal pain, epigastric 08/22/2009  . Chronic low back pain   . GERD (gastroesophageal reflux disease)   . Diverticulosis   . Hemorrhoids   . Adenomatous colon polyp   . NSVD (normal spontaneous vaginal delivery)     X2  . Duodenal ulcer   . Insomnia    Past Surgical History  Procedure Laterality Date  . Cholecystectomy    . Blepharoplasty    . Dilation and curettage of uterus    . Abdominal hysterectomy      TVH  . Eye surgery  2008    BILATERAL CATARACT   . Back surgery      RUPTURED DISC  . Colonoscopy  JAN 2014    NEXT COLONOSCOPY IN 10 YEARS    reports that she quit smoking about 12 years ago. Her smoking use included Cigarettes. She has a 60 pack-year smoking history. She has never used smokeless tobacco. She reports that she does not drink alcohol or use illicit drugs. family history includes Cancer in her father and mother; Diabetes in her brother, mother, and sister; Heart disease in her brother, daughter, and father; Hypertension in her brother and sister. There is no history of Stomach cancer. Allergies  Allergen Reactions  . Aspirin     REACTION: Hives     Review of Systems  Constitutional: Positive for chills and fatigue. Negative for fever.  HENT: Positive for congestion and sore throat. Negative for ear pain.   Respiratory: Positive for cough. Negative for shortness of breath and wheezing.   Cardiovascular: Negative for chest pain.  Neurological: Positive for headaches.       Objective:   Physical Exam  Constitutional: She appears well-developed and  well-nourished.  HENT:  Right Ear: External ear normal.  Left Ear: External ear normal.  Mouth/Throat: Oropharynx is clear and moist.  Neck: Neck supple.  Cardiovascular: Normal rate and regular rhythm.   Pulmonary/Chest: Effort normal and breath sounds normal. No respiratory distress. She has no wheezes. She has no rales.  Lymphadenopathy:    She has no cervical adenopathy.  Skin: No rash noted.          Assessment & Plan:  Acute viral URI with cough. Nonfocal exam. Patient is nontoxic and in no respiratory distress. Recommend symptomatic treatment. She's had severe cough at night not relieved with over-the-counter medication. Hycodan cough syrup 1 teaspoon daily at bedtime for severe cough. Stay well-hydrated. Follow-up for any worsening symptoms  Eulas Post MD [facility]

## 2015-09-30 NOTE — Progress Notes (Signed)
Pre visit review using our clinic review tool, if applicable. No additional management support is needed unless otherwise documented below in the visit note. 

## 2015-09-30 NOTE — Patient Instructions (Signed)
Viral Infections °A viral infection can be caused by different types of viruses. Most viral infections are not serious and resolve on their own. However, some infections may cause severe symptoms and may lead to further complications. °SYMPTOMS °Viruses can frequently cause: °· Minor sore throat. °· Aches and pains. °· Headaches. °· Runny nose. °· Different types of rashes. °· Watery eyes. °· Tiredness. °· Cough. °· Loss of appetite. °· Gastrointestinal infections, resulting in nausea, vomiting, and diarrhea. °These symptoms do not respond to antibiotics because the infection is not caused by bacteria. However, you might catch a bacterial infection following the viral infection. This is sometimes called a "superinfection." Symptoms of such a bacterial infection may include: °· Worsening sore throat with pus and difficulty swallowing. °· Swollen neck glands. °· Chills and a high or persistent fever. °· Severe headache. °· Tenderness over the sinuses. °· Persistent overall ill feeling (malaise), muscle aches, and tiredness (fatigue). °· Persistent cough. °· Yellow, green, or brown mucus production with coughing. °HOME CARE INSTRUCTIONS  °· Only take over-the-counter or prescription medicines for pain, discomfort, diarrhea, or fever as directed by your caregiver. °· Drink enough water and fluids to keep your urine clear or pale yellow. Sports drinks can provide valuable electrolytes, sugars, and hydration. °· Get plenty of rest and maintain proper nutrition. Soups and broths with crackers or rice are fine. °SEEK IMMEDIATE MEDICAL CARE IF:  °· You have severe headaches, shortness of breath, chest pain, neck pain, or an unusual rash. °· You have uncontrolled vomiting, diarrhea, or you are unable to keep down fluids. °· You or your child has an oral temperature above 102° F (38.9° C), not controlled by medicine. °· Your baby is older than 3 months with a rectal temperature of 102° F (38.9° C) or higher. °· Your baby is 3  months old or younger with a rectal temperature of 100.4° F (38° C) or higher. °MAKE SURE YOU:  °· Understand these instructions. °· Will watch your condition. °· Will get help right away if you are not doing well or get worse. °  °This information is not intended to replace advice given to you by your health care provider. Make sure you discuss any questions you have with your health care provider. °  °Document Released: 02/07/2005 Document Revised: 07/23/2011 Document Reviewed: 10/06/2014 °Elsevier Interactive Patient Education ©2016 [employer] ° °

## 2015-11-17 ENCOUNTER — Other Ambulatory Visit: Payer: Self-pay | Admitting: Physician Assistant

## 2015-11-17 NOTE — Telephone Encounter (Signed)
Please schedule a yearly follow up for additional refills.

## 2016-01-04 ENCOUNTER — Encounter: Payer: Self-pay | Admitting: Family Medicine

## 2016-01-04 ENCOUNTER — Ambulatory Visit (INDEPENDENT_AMBULATORY_CARE_PROVIDER_SITE_OTHER): Payer: Medicare Other | Admitting: Family Medicine

## 2016-01-04 VITALS — BP 138/70 | HR 73 | Temp 97.3°F | Ht 63.0 in | Wt 144.2 lb

## 2016-01-04 DIAGNOSIS — Z79899 Other long term (current) drug therapy: Secondary | ICD-10-CM | POA: Diagnosis not present

## 2016-01-04 DIAGNOSIS — L03012 Cellulitis of left finger: Secondary | ICD-10-CM | POA: Diagnosis not present

## 2016-01-04 DIAGNOSIS — L603 Nail dystrophy: Secondary | ICD-10-CM | POA: Diagnosis not present

## 2016-01-04 LAB — CBC WITH DIFFERENTIAL/PLATELET
Basophils Absolute: 0 10*3/uL (ref 0.0–0.1)
Basophils Relative: 0.2 % (ref 0.0–3.0)
EOS PCT: 2.5 % (ref 0.0–5.0)
Eosinophils Absolute: 0.2 10*3/uL (ref 0.0–0.7)
HEMATOCRIT: 41.1 % (ref 36.0–46.0)
Hemoglobin: 14 g/dL (ref 12.0–15.0)
LYMPHS ABS: 2.7 10*3/uL (ref 0.7–4.0)
Lymphocytes Relative: 35 % (ref 12.0–46.0)
MCHC: 34 g/dL (ref 30.0–36.0)
MCV: 94 fl (ref 78.0–100.0)
MONOS PCT: 5.7 % (ref 3.0–12.0)
Monocytes Absolute: 0.4 10*3/uL (ref 0.1–1.0)
NEUTROS ABS: 4.4 10*3/uL (ref 1.4–7.7)
NEUTROS PCT: 56.6 % (ref 43.0–77.0)
Platelets: 256 10*3/uL (ref 150.0–400.0)
RBC: 4.38 Mil/uL (ref 3.87–5.11)
RDW: 13.6 % (ref 11.5–15.5)
WBC: 7.8 10*3/uL (ref 4.0–10.5)

## 2016-01-04 LAB — HEPATIC FUNCTION PANEL
ALK PHOS: 78 U/L (ref 39–117)
ALT: 13 U/L (ref 0–35)
AST: 17 U/L (ref 0–37)
Albumin: 4.3 g/dL (ref 3.5–5.2)
BILIRUBIN DIRECT: 0.1 mg/dL (ref 0.0–0.3)
Total Bilirubin: 0.5 mg/dL (ref 0.2–1.2)
Total Protein: 6.8 g/dL (ref 6.0–8.3)

## 2016-01-04 MED ORDER — TRIAMCINOLONE ACETONIDE 0.1 % EX CREA: 1.0000 "application " | TOPICAL_CREAM | Freq: Two times a day (BID) | CUTANEOUS | 0 refills | Status: DC

## 2016-01-04 MED ORDER — TERBINAFINE HCL 250 MG PO TABS: 250.0000 mg | ORAL_TABLET | Freq: Every day | ORAL | 0 refills | Status: DC

## 2016-01-04 NOTE — Progress Notes (Signed)
Subjective:     [name], female   DOB: Jan 01, 1946, 70 y.o.   MRN: [id number]  HPI Patient here with dystrophic left index finger nail and also some soreness and slight redness around the base of the left index finger nail. She does have her hands and this water frequently. Does not use any gloves. She has noted redness around the base of the nail for few weeks and has had some separation of the nail and some thickening underneath the nail for least couple months. Denies any finger injury.  Past Medical History:  Diagnosis Date  . Abdominal pain, epigastric 08/22/2009  . Adenomatous colon polyp   . Chronic low back pain   . Diverticulosis   . Duodenal ulcer   . GERD (gastroesophageal reflux disease)   . Hemorrhoids   . HYPOTHYROIDISM 07/20/2008  . Insomnia   . NSVD (normal spontaneous vaginal delivery)    X2   Past Surgical History:  Procedure Laterality Date  . ABDOMINAL HYSTERECTOMY     TVH  . BACK SURGERY     RUPTURED DISC  . BLEPHAROPLASTY    . CHOLECYSTECTOMY    . COLONOSCOPY  JAN 2014   NEXT COLONOSCOPY IN 10 YEARS  . DILATION AND CURETTAGE OF UTERUS    . EYE SURGERY  2008   BILATERAL CATARACT     reports that she quit smoking about 12 years ago. Her smoking use included Cigarettes. She has a 60.00 pack-year smoking history. She has never used smokeless tobacco. She reports that she does not drink alcohol or use drugs. family history includes Cancer in her father and mother; Diabetes in her brother, mother, and sister; Heart disease in her brother, daughter, and father; Hypertension in her brother and sister. Allergies  Allergen Reactions  . Aspirin     REACTION: Hives     Review of Systems  Constitutional: Negative for chills and fever.       Objective:   Physical Exam  Constitutional: She appears well-developed and well-nourished.  Cardiovascular: Normal rate and regular rhythm.   Pulmonary/Chest: Effort normal and breath sounds normal. No  respiratory distress. She has no wheezes. She has no rales.  Skin:  Left index finger reveals some mild erythema along both the medial and lateral border of the nail and also near the proximal aspect of the nail. She does have some thickening underneath the fingernail and separation of the nail from tissue underneath. No color changes.       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 She has evidence for some chronic onycholysis and probable onychomycosis changes      Plan:     -Keep hands dry as possible -Triamcinolone 0.1% cream twice daily -Check hepatic panel and if normal start Lamisil 250 mg once daily for 6 weeks  Eulas Post MD [facility]

## 2016-01-04 NOTE — Patient Instructions (Signed)
Nail Ringworm A fungal infection of the nail (tinea unguium/onychomycosis) is common. It is common as the visible part of the nail is composed of dead cells which have no blood supply to help prevent infection. It occurs because fungi are everywhere and will pick any opportunity to grow on any dead material. Because nails are very slow growing they require up to 2 years of treatment with anti-fungal medications. The entire nail back to the base is infected. This includes approximately  of the nail which you cannot see. If your caregiver has prescribed a medication by mouth, take it every day and as directed. No progress will be seen for at least 6 to 9 months. Do not be disappointed! Because fungi live on dead cells with little or no exposure to blood supply, medication delivery to the infection is slow; thus the cure is slow. It is also why you can observe no progress in the first 6 months. The nail becoming cured is the base of the nail, as it has the blood supply. Topical medication such as creams and ointments are usually not effective. Important in successful treatment of nail fungus is closely following the medication regimen that your doctor prescribes. Sometimes you and your caregiver may elect to speed up this process by surgical removal of all the nails. Even this may still require 6 to 9 months of additional oral medications. See your caregiver as directed. Remember there will be no visible improvement for at least 6 months. See your caregiver sooner if other signs of infection (redness and swelling) develop.   This information is not intended to replace advice given to you by your health care provider. Make sure you discuss any questions you have with your health care provider.   Document Released: 04/27/2000 Document Revised: 09/14/2014 Document Reviewed: 11/01/2014 Elsevier Interactive Patient Education 2016 [employer]  

## 2016-01-04 NOTE — Progress Notes (Signed)
Pre visit review using our clinic review tool, if applicable. No additional management support is needed unless otherwise documented below in the visit note. 

## 2016-01-09 ENCOUNTER — Other Ambulatory Visit: Payer: Self-pay | Admitting: Internal Medicine

## 2016-01-09 ENCOUNTER — Other Ambulatory Visit: Payer: Self-pay | Admitting: Family Medicine

## 2016-01-09 ENCOUNTER — Other Ambulatory Visit: Payer: Self-pay | Admitting: Physician Assistant

## 2016-01-09 NOTE — Telephone Encounter (Signed)
Rx refill sent to pharmacy. 

## 2016-02-10 ENCOUNTER — Ambulatory Visit (INDEPENDENT_AMBULATORY_CARE_PROVIDER_SITE_OTHER): Payer: Medicare Other | Admitting: *Deleted

## 2016-02-10 DIAGNOSIS — Z23 Encounter for immunization: Secondary | ICD-10-CM | POA: Diagnosis not present

## 2016-02-12 ENCOUNTER — Other Ambulatory Visit: Payer: Self-pay | Admitting: Family Medicine

## 2016-02-28 DIAGNOSIS — D239 Other benign neoplasm of skin, unspecified: Secondary | ICD-10-CM | POA: Diagnosis not present

## 2016-02-28 DIAGNOSIS — L728 Other follicular cysts of the skin and subcutaneous tissue: Secondary | ICD-10-CM | POA: Diagnosis not present

## 2016-02-28 DIAGNOSIS — L570 Actinic keratosis: Secondary | ICD-10-CM | POA: Diagnosis not present

## 2016-03-07 ENCOUNTER — Other Ambulatory Visit: Payer: Self-pay | Admitting: Internal Medicine

## 2016-03-26 ENCOUNTER — Other Ambulatory Visit: Payer: Self-pay | Admitting: Physician Assistant

## 2016-04-10 DIAGNOSIS — M961 Postlaminectomy syndrome, not elsewhere classified: Secondary | ICD-10-CM | POA: Diagnosis not present

## 2016-04-10 DIAGNOSIS — M5136 Other intervertebral disc degeneration, lumbar region: Secondary | ICD-10-CM | POA: Diagnosis not present

## 2016-04-10 DIAGNOSIS — Z79891 Long term (current) use of opiate analgesic: Secondary | ICD-10-CM | POA: Diagnosis not present

## 2016-04-27 DIAGNOSIS — M5136 Other intervertebral disc degeneration, lumbar region: Secondary | ICD-10-CM | POA: Diagnosis not present

## 2016-04-27 DIAGNOSIS — M5116 Intervertebral disc disorders with radiculopathy, lumbar region: Secondary | ICD-10-CM | POA: Diagnosis not present

## 2016-04-27 DIAGNOSIS — M5416 Radiculopathy, lumbar region: Secondary | ICD-10-CM | POA: Diagnosis not present

## 2016-04-30 ENCOUNTER — Ambulatory Visit (INDEPENDENT_AMBULATORY_CARE_PROVIDER_SITE_OTHER): Payer: Medicare Other | Admitting: Family Medicine

## 2016-04-30 VITALS — BP 120/80 | HR 81 | Temp 98.0°F | Ht 63.0 in | Wt 148.6 lb

## 2016-04-30 DIAGNOSIS — J0100 Acute maxillary sinusitis, unspecified: Secondary | ICD-10-CM

## 2016-04-30 MED ORDER — DOXYCYCLINE HYCLATE 100 MG PO TABS: 100.0000 mg | ORAL_TABLET | Freq: Two times a day (BID) | ORAL | 0 refills | Status: DC

## 2016-04-30 MED ORDER — ZOLPIDEM TARTRATE 10 MG PO TABS: 10.0000 mg | ORAL_TABLET | Freq: Every evening | ORAL | 0 refills | Status: DC | PRN

## 2016-04-30 NOTE — Patient Instructions (Signed)

## 2016-04-30 NOTE — Progress Notes (Signed)
Pre visit review using our clinic review tool, if applicable. No additional management support is needed unless otherwise documented below in the visit note. 

## 2016-04-30 NOTE — Progress Notes (Signed)
Subjective:     [name], female   DOB: 11/28/1945, 70 y.o.   MRN: [id number]  HPI Patient seen with upper respiratory symptoms. Onset about one week ago of sore throat, headache, body aches, nasal congestion. She had steroid injection per orthopedics on Friday and seemed to be feeling much better and then on Saturday noticed worsening. She's not had any fever at any point but has had some thick yellow bloody nasal discharge and a she'll pain especially left maxillary sinus. She's also had some progressive dry cough. No nausea or vomiting.  Past Medical History:  Diagnosis Date  . Abdominal pain, epigastric 08/22/2009  . Adenomatous colon polyp   . Chronic low back pain   . Diverticulosis   . Duodenal ulcer   . GERD (gastroesophageal reflux disease)   . Hemorrhoids   . HYPOTHYROIDISM 07/20/2008  . Insomnia   . NSVD (normal spontaneous vaginal delivery)    X2   Past Surgical History:  Procedure Laterality Date  . ABDOMINAL HYSTERECTOMY     TVH  . BACK SURGERY     RUPTURED DISC  . BLEPHAROPLASTY    . CHOLECYSTECTOMY    . COLONOSCOPY  JAN 2014   NEXT COLONOSCOPY IN 10 YEARS  . DILATION AND CURETTAGE OF UTERUS    . EYE SURGERY  2008   BILATERAL CATARACT     reports that she quit smoking about 12 years ago. Her smoking use included Cigarettes. She has a 60.00 pack-year smoking history. She has never used smokeless tobacco. She reports that she does not drink alcohol or use drugs. family history includes Cancer in her father and mother; Diabetes in her brother, mother, and sister; Heart disease in her brother, daughter, and father; Hypertension in her brother and sister. Allergies  Allergen Reactions  . Aspirin     REACTION: Hives     Review of Systems  Constitutional: Positive for fatigue. Negative for chills and fever.  HENT: Positive for congestion, sinus pain, sinus pressure and sore throat.   Respiratory: Positive for cough.   Neurological: Positive for  headaches.       Objective:   Physical Exam  Constitutional: She appears well-developed and well-nourished.  HENT:  Right Ear: External ear normal.  Left Ear: External ear normal.  Mouth/Throat: Oropharynx is clear and moist.  Neck: Neck supple.  Cardiovascular: Normal rate and regular rhythm.   Pulmonary/Chest: Effort normal and breath sounds normal. No respiratory distress. She has no wheezes. She has no rales.  Lymphadenopathy:    She has no cervical adenopathy.       Assessment:     Acute sinusitis. Patient describes worsening of symptoms after some initial improvement    Plan:     -Start doxycycline 100 mg twice daily for 10 days -Stay well-hydrated -Consider over-the-counter plain Mucinex -Follow-up promptly for any fever or worsening symptoms  Eulas Post MD [facility]

## 2016-05-11 DIAGNOSIS — M5136 Other intervertebral disc degeneration, lumbar region: Secondary | ICD-10-CM | POA: Diagnosis not present

## 2016-05-11 DIAGNOSIS — Z79891 Long term (current) use of opiate analgesic: Secondary | ICD-10-CM | POA: Diagnosis not present

## 2016-05-11 DIAGNOSIS — M961 Postlaminectomy syndrome, not elsewhere classified: Secondary | ICD-10-CM | POA: Diagnosis not present

## 2016-05-12 ENCOUNTER — Other Ambulatory Visit: Payer: Self-pay | Admitting: Physician Assistant

## 2016-05-22 DIAGNOSIS — M7062 Trochanteric bursitis, left hip: Secondary | ICD-10-CM | POA: Diagnosis not present

## 2016-05-25 ENCOUNTER — Encounter: Payer: Self-pay | Admitting: Family Medicine

## 2016-05-25 ENCOUNTER — Other Ambulatory Visit: Payer: Self-pay

## 2016-05-25 ENCOUNTER — Ambulatory Visit (INDEPENDENT_AMBULATORY_CARE_PROVIDER_SITE_OTHER): Payer: Medicare Other | Admitting: Family Medicine

## 2016-05-25 VITALS — BP 130/70 | HR 68 | Temp 98.0°F | Ht 63.0 in | Wt 150.8 lb

## 2016-05-25 DIAGNOSIS — R030 Elevated blood-pressure reading, without diagnosis of hypertension: Secondary | ICD-10-CM

## 2016-05-25 NOTE — Patient Instructions (Signed)
Hypertension Hypertension, commonly called high blood pressure, is when the force of blood pumping through your arteries is too strong. Your arteries are the blood vessels that carry blood from your heart throughout your body. A blood pressure reading consists of a higher number over a lower number, such as 110/72. The higher number (systolic) is the pressure inside your arteries when your heart pumps. The lower number (diastolic) is the pressure inside your arteries when your heart relaxes. Ideally you want your blood pressure below 120/80. Hypertension forces your heart to work harder to pump blood. Your arteries may become narrow or stiff. Having untreated or uncontrolled hypertension can cause heart attack, stroke, kidney disease, and other problems. What increases the risk? Some risk factors for high blood pressure are controllable. Others are not. Risk factors you cannot control include:  Race. You may be at higher risk if you are African American.  Age. Risk increases with age.  Gender. Men are at higher risk than women before age 45 years. After age 65, women are at higher risk than men. Risk factors you can control include:  Not getting enough exercise or physical activity.  Being overweight.  Getting too much fat, sugar, calories, or salt in your diet.  Drinking too much alcohol. What are the signs or symptoms? Hypertension does not usually cause signs or symptoms. Extremely high blood pressure (hypertensive crisis) may cause headache, anxiety, shortness of breath, and nosebleed. How is this diagnosed? To check if you have hypertension, your health care provider will measure your blood pressure while you are seated, with your arm held at the level of your heart. It should be measured at least twice using the same arm. Certain conditions can cause a difference in blood pressure between your right and left arms. A blood pressure reading that is higher than normal on one occasion does  not mean that you need treatment. If it is not clear whether you have high blood pressure, you may be asked to return on a different day to have your blood pressure checked again. Or, you may be asked to monitor your blood pressure at home for 1 or more weeks. How is this treated? Treating high blood pressure includes making lifestyle changes and possibly taking medicine. Living a healthy lifestyle can help lower high blood pressure. You may need to change some of your habits. Lifestyle changes may include:  Following the DASH diet. This diet is high in fruits, vegetables, and whole grains. It is low in salt, red meat, and added sugars.  Keep your sodium intake below 2,300 mg per day.  Getting at least 30-45 minutes of aerobic exercise at least 4 times per week.  Losing weight if necessary.  Not smoking.  Limiting alcoholic beverages.  Learning ways to reduce stress. Your health care provider may prescribe medicine if lifestyle changes are not enough to get your blood pressure under control, and if one of the following is true:  You are 18-59 years of age and your systolic blood pressure is above 140.  You are 60 years of age or older, and your systolic blood pressure is above 150.  Your diastolic blood pressure is above 90.  You have diabetes, and your systolic blood pressure is over 140 or your diastolic blood pressure is over 90.  You have kidney disease and your blood pressure is above 140/90.  You have heart disease and your blood pressure is above 140/90. Your personal target blood pressure may vary depending on your medical   conditions, your age, and other factors. Follow these instructions at home:  Have your blood pressure rechecked as directed by your health care provider.  Take medicines only as directed by your health care provider. Follow the directions carefully. Blood pressure medicines must be taken as prescribed. The medicine does not work as well when you skip  doses. Skipping doses also puts you at risk for problems.  Do not smoke.  Monitor your blood pressure at home as directed by your health care provider. Contact a health care provider if:  You think you are having a reaction to medicines taken.  You have recurrent headaches or feel dizzy.  You have swelling in your ankles.  You have trouble with your vision. Get help right away if:  You develop a severe headache or confusion.  You have unusual weakness, numbness, or feel faint.  You have severe chest or abdominal pain.  You vomit repeatedly.  You have trouble breathing. This information is not intended to replace advice given to you by your health care provider. Make sure you discuss any questions you have with your health care provider. Document Released: 04/30/2005 Document Revised: 10/06/2015 Document Reviewed: 02/20/2013 Elsevier Interactive Patient Education  2017 Elsevier Inc.  Try holding the Celebrex Try to keep sodium intake < 3,000 mg daily Monitor blood pressure and write down readings and bring back for review in 2 weeks. Consider home blood pressure monitor.

## 2016-05-25 NOTE — Progress Notes (Signed)
Pre visit review using our clinic review tool, if applicable. No additional management support is needed unless otherwise documented below in the visit note. 

## 2016-05-25 NOTE — Progress Notes (Signed)
Subjective:     [name], female   DOB: Jun 22, 1945, 71 y.o.   MRN: [id number]  HPI Patient seen with concern for elevated blood pressure. Never diagnosed with hypertension previously.  . She states this past Monday she was at orthopedic office and had blood pressure 153/80. On Wednesday she went to [facility] and had blood pressure reading of 188/105. That same afternoon she went to local fire department had reading 153/80 and today and fire department 163/90. She states she did receive steroid injection to her left hip December 15 and no steroids since then. She takes Celebrex once daily. No recent dietary changes. No alcohol use. No headaches. No chest pains. Exercise limited because of her hip issues  Past Medical History:  Diagnosis Date  . Abdominal pain, epigastric 08/22/2009  . Adenomatous colon polyp   . Chronic low back pain   . Diverticulosis   . Duodenal ulcer   . GERD (gastroesophageal reflux disease)   . Hemorrhoids   . HYPOTHYROIDISM 07/20/2008  . Insomnia   . NSVD (normal spontaneous vaginal delivery)    X2   Past Surgical History:  Procedure Laterality Date  . ABDOMINAL HYSTERECTOMY     TVH  . BACK SURGERY     RUPTURED DISC  . BLEPHAROPLASTY    . CHOLECYSTECTOMY    . COLONOSCOPY  JAN 2014   NEXT COLONOSCOPY IN 10 YEARS  . DILATION AND CURETTAGE OF UTERUS    . EYE SURGERY  2008   BILATERAL CATARACT     reports that she quit smoking about 12 years ago. Her smoking use included Cigarettes. She has a 60.00 pack-year smoking history. She has never used smokeless tobacco. She reports that she does not drink alcohol or use drugs. family history includes Cancer in her father and mother; Diabetes in her brother, mother, and sister; Heart disease in her brother, daughter, and father; Hypertension in her brother and sister. Allergies  Allergen Reactions  . Aspirin     REACTION: Hives     Review of Systems  Constitutional: Negative for fatigue.  Eyes:  Negative for visual disturbance.  Respiratory: Negative for cough, chest tightness, shortness of breath and wheezing.   Cardiovascular: Negative for chest pain, palpitations and leg swelling.  Neurological: Negative for dizziness, seizures, syncope, weakness, light-headedness and headaches.       Objective:   Physical Exam  Constitutional: She appears well-developed and well-nourished.  Eyes: Pupils are equal, round, and reactive to light.  Neck: Neck supple. No JVD present. No thyromegaly present.  Cardiovascular: Normal rate and regular rhythm.  Exam reveals no gallop.   Pulmonary/Chest: Effort normal and breath sounds normal. No respiratory distress. She has no wheezes. She has no rales.  Musculoskeletal: She exhibits no edema.  Neurological: She is alert.       Assessment:     Elevated blood pressure without prior diagnosis of hypertension. Repeat reading right arm seated 150/70 and left arm 144/70    Plan:     -Keep sodium intake less than 3000 mg daily -Try to find alternative exercises to walking since she cannot walk because of her hip issues -Try to hold Celebrex as much as possible -Continue to monitor blood pressure at home and record readings and bring back for review in 2 weeks  Eulas Post MD [facility] at [facility]

## 2016-05-26 ENCOUNTER — Other Ambulatory Visit: Payer: Self-pay | Admitting: Internal Medicine

## 2016-05-26 DIAGNOSIS — J0100 Acute maxillary sinusitis, unspecified: Secondary | ICD-10-CM | POA: Diagnosis not present

## 2016-05-26 DIAGNOSIS — M26622 Arthralgia of left temporomandibular joint: Secondary | ICD-10-CM | POA: Diagnosis not present

## 2016-05-28 DIAGNOSIS — M7062 Trochanteric bursitis, left hip: Secondary | ICD-10-CM | POA: Diagnosis not present

## 2016-06-05 DIAGNOSIS — M7062 Trochanteric bursitis, left hip: Secondary | ICD-10-CM | POA: Diagnosis not present

## 2016-06-08 ENCOUNTER — Ambulatory Visit (INDEPENDENT_AMBULATORY_CARE_PROVIDER_SITE_OTHER): Payer: Medicare Other | Admitting: Family Medicine

## 2016-06-08 VITALS — BP 130/70 | HR 79 | Ht 63.0 in | Wt 147.0 lb

## 2016-06-08 DIAGNOSIS — R030 Elevated blood-pressure reading, without diagnosis of hypertension: Secondary | ICD-10-CM

## 2016-06-08 NOTE — Progress Notes (Signed)
Subjective:     [name], female   DOB: 05/25/1945, 71 y.o.   MRN: [id number]  HPI Patient seen for follow-up hypertension. Refer to last note. She had blood pressure in here 150/70. Never treated for hypertension. She got a home blood pressure monitor since last visit. Most of her readings but have been below 0000000 systolic and below 80 diastolic. She has had blood pressures also checked at her local far department and has gotten very similar readings. She is trying to leave off Celebrex. She's tried to watch her sodium intake closely. Exercise very limited. No headaches. No chest pains. No peripheral edema.  Past Medical History:  Diagnosis Date  . Abdominal pain, epigastric 08/22/2009  . Adenomatous colon polyp   . Chronic low back pain   . Diverticulosis   . Duodenal ulcer   . GERD (gastroesophageal reflux disease)   . Hemorrhoids   . HYPOTHYROIDISM 07/20/2008  . Insomnia   . NSVD (normal spontaneous vaginal delivery)    X2   Past Surgical History:  Procedure Laterality Date  . ABDOMINAL HYSTERECTOMY     TVH  . BACK SURGERY     RUPTURED DISC  . BLEPHAROPLASTY    . CHOLECYSTECTOMY    . COLONOSCOPY  JAN 2014   NEXT COLONOSCOPY IN 10 YEARS  . DILATION AND CURETTAGE OF UTERUS    . EYE SURGERY  2008   BILATERAL CATARACT     reports that she quit smoking about 12 years ago. Her smoking use included Cigarettes. She has a 60.00 pack-year smoking history. She has never used smokeless tobacco. She reports that she does not drink alcohol or use drugs. family history includes Cancer in her father and mother; Diabetes in her brother, mother, and sister; Heart disease in her brother, daughter, and father; Hypertension in her brother and sister. Allergies  Allergen Reactions  . Aspirin     REACTION: Hives     Review of Systems  Constitutional: Negative for fatigue.  Eyes: Negative for visual disturbance.  Respiratory: Negative for cough, chest tightness, shortness of  breath and wheezing.   Cardiovascular: Negative for chest pain, palpitations and leg swelling.  Neurological: Negative for dizziness, seizures, syncope, weakness, light-headedness and headaches.       Objective:   Physical Exam  Constitutional: She appears well-developed and well-nourished.  Eyes: Pupils are equal, round, and reactive to light.  Neck: Neck supple. No JVD present. No thyromegaly present.  Cardiovascular: Normal rate and regular rhythm.  Exam reveals no gallop.   Pulmonary/Chest: Effort normal and breath sounds normal. No respiratory distress. She has no wheezes. She has no rales.  Musculoskeletal: She exhibits no edema.  Neurological: She is alert.       Assessment:     Elevated blood pressure. Improved since last visit. Repeat reading today after rest 130/62    Plan:     -Continue to monitor -Keep sodium intake less than 3000 mg daily -Follow-up in 3 months to recheck and be in touch for consistent readings greater than 140/90  Eulas Post MD [facility] at [facility]

## 2016-06-08 NOTE — Patient Instructions (Signed)
Continue to monitor blood pressure and be in touch if consistently > 140/90 

## 2016-06-08 NOTE — Progress Notes (Signed)
Pre visit review using our clinic review tool, if applicable. No additional management support is needed unless otherwise documented below in the visit note. 

## 2016-06-12 DIAGNOSIS — M7062 Trochanteric bursitis, left hip: Secondary | ICD-10-CM | POA: Diagnosis not present

## 2016-06-19 DIAGNOSIS — M7062 Trochanteric bursitis, left hip: Secondary | ICD-10-CM | POA: Diagnosis not present

## 2016-06-26 DIAGNOSIS — M7062 Trochanteric bursitis, left hip: Secondary | ICD-10-CM | POA: Diagnosis not present

## 2016-07-03 DIAGNOSIS — M7062 Trochanteric bursitis, left hip: Secondary | ICD-10-CM | POA: Diagnosis not present

## 2016-07-24 ENCOUNTER — Telehealth: Payer: Self-pay

## 2016-07-24 NOTE — Telephone Encounter (Signed)
Call to Ms. Cassidy Norton regarding AWV Stated she is suppose to come back to have BP checked but was hoping she could check it when [name] came the 28th. My schedule is blocked on the 28th when [name] is here.  Is already seeing other md but will consider making an apt and will call the office back to schedule

## 2016-08-09 DIAGNOSIS — H353130 Nonexudative age-related macular degeneration, bilateral, stage unspecified: Secondary | ICD-10-CM | POA: Diagnosis not present

## 2016-08-09 DIAGNOSIS — H35033 Hypertensive retinopathy, bilateral: Secondary | ICD-10-CM | POA: Diagnosis not present

## 2016-08-09 DIAGNOSIS — H04123 Dry eye syndrome of bilateral lacrimal glands: Secondary | ICD-10-CM | POA: Diagnosis not present

## 2016-08-09 DIAGNOSIS — Z961 Presence of intraocular lens: Secondary | ICD-10-CM | POA: Diagnosis not present

## 2016-08-13 ENCOUNTER — Other Ambulatory Visit: Payer: Self-pay | Admitting: Internal Medicine

## 2016-08-13 ENCOUNTER — Other Ambulatory Visit: Payer: Self-pay | Admitting: Family Medicine

## 2016-08-15 ENCOUNTER — Encounter: Payer: Self-pay | Admitting: Gynecology

## 2016-08-15 DIAGNOSIS — Z1231 Encounter for screening mammogram for malignant neoplasm of breast: Secondary | ICD-10-CM | POA: Diagnosis not present

## 2016-08-22 ENCOUNTER — Encounter: Payer: Self-pay | Admitting: Physician Assistant

## 2016-08-22 ENCOUNTER — Ambulatory Visit (INDEPENDENT_AMBULATORY_CARE_PROVIDER_SITE_OTHER): Payer: Medicare Other | Admitting: Physician Assistant

## 2016-08-22 ENCOUNTER — Other Ambulatory Visit (INDEPENDENT_AMBULATORY_CARE_PROVIDER_SITE_OTHER): Payer: Medicare Other

## 2016-08-22 VITALS — BP 134/72 | HR 76 | Ht 62.75 in | Wt 152.5 lb

## 2016-08-22 DIAGNOSIS — K279 Peptic ulcer, site unspecified, unspecified as acute or chronic, without hemorrhage or perforation: Secondary | ICD-10-CM

## 2016-08-22 DIAGNOSIS — K30 Functional dyspepsia: Secondary | ICD-10-CM

## 2016-08-22 DIAGNOSIS — R140 Abdominal distension (gaseous): Secondary | ICD-10-CM | POA: Diagnosis not present

## 2016-08-22 DIAGNOSIS — R1013 Epigastric pain: Secondary | ICD-10-CM

## 2016-08-22 LAB — CBC WITH DIFFERENTIAL/PLATELET
Basophils Absolute: 0 10*3/uL (ref 0.0–0.1)
Basophils Relative: 0.4 % (ref 0.0–3.0)
Eosinophils Absolute: 0.1 10*3/uL (ref 0.0–0.7)
Eosinophils Relative: 1.4 % (ref 0.0–5.0)
HCT: 38.9 % (ref 36.0–46.0)
Hemoglobin: 13.8 g/dL (ref 12.0–15.0)
LYMPHS ABS: 2.8 10*3/uL (ref 0.7–4.0)
Lymphocytes Relative: 34.1 % (ref 12.0–46.0)
MCHC: 35.4 g/dL (ref 30.0–36.0)
MCV: 94.5 fl (ref 78.0–100.0)
MONO ABS: 0.5 10*3/uL (ref 0.1–1.0)
Monocytes Relative: 6.5 % (ref 3.0–12.0)
NEUTROS PCT: 57.6 % (ref 43.0–77.0)
Neutro Abs: 4.8 10*3/uL (ref 1.4–7.7)
PLATELETS: 221 10*3/uL (ref 150.0–400.0)
RBC: 4.12 Mil/uL (ref 3.87–5.11)
RDW: 13.6 % (ref 11.5–15.5)
WBC: 8.3 10*3/uL (ref 4.0–10.5)

## 2016-08-22 LAB — COMPREHENSIVE METABOLIC PANEL
ALK PHOS: 80 U/L (ref 39–117)
ALT: 15 U/L (ref 0–35)
AST: 16 U/L (ref 0–37)
Albumin: 4.2 g/dL (ref 3.5–5.2)
BILIRUBIN TOTAL: 0.4 mg/dL (ref 0.2–1.2)
BUN: 7 mg/dL (ref 6–23)
CO2: 28 mEq/L (ref 19–32)
Calcium: 9.3 mg/dL (ref 8.4–10.5)
Chloride: 105 mEq/L (ref 96–112)
Creatinine, Ser: 0.6 mg/dL (ref 0.40–1.20)
GFR: 104.14 mL/min (ref >60.00)
Glucose, Bld: 108 mg/dL — ABNORMAL HIGH (ref 70–99)
POTASSIUM: 3.9 meq/L (ref 3.5–5.1)
SODIUM: 141 meq/L (ref 135–145)
TOTAL PROTEIN: 6.9 g/dL (ref 6.0–8.3)

## 2016-08-22 LAB — LIPASE: Lipase: 48 U/L (ref 11.0–59.0)

## 2016-08-22 MED ORDER — OMEPRAZOLE 40 MG PO CPDR: 40.0000 mg | DELAYED_RELEASE_CAPSULE | Freq: Two times a day (BID) | ORAL | 4 refills | Status: DC

## 2016-08-22 NOTE — Progress Notes (Signed)
Subjective:    [name], female    DOB: March 02, 1946, 71 y.o.   MRN: [id number]  HPI Cassidy Norton is a pleasant 71 year old white female known to Dr. Henrene Pastor. She has history of hypothyroidism, osteopenia, previous history of adenomatous colon polyps, GERD and peptic ulcer disease. She was last seen in our office in 2016 with complaints of epigastric pain. At that time she had been taking an NSAID regularly and underwent EGD in July 2016 with finding of multiple shallow duodenal ulcers. H. pylori was negative. She was started on twice daily omeprazole and Carafate. Last colonoscopy in 2014 was negative, recommended to to have a 10 year interval follow-up. Patient states that she has continued on omeprazole 20 mg by mouth twice daily and Carafate 1 by mouth 3 times daily over the past couple of years. She has been taking 3 or 4 Tylenol every day but no NSAIDs. She says she started having upper abdominal discomfort about a month ago she describes as burning and soreness across her upper abdomen which is been constant. She does feel that this is somewhat worse about half an hour after eating. She has had associated bloating indigestion and increasing reflux symptoms. No dysphagia or odynophagia. No changes in her bowel habits and no melena or hematochezia. She has not started any new medications, and again no aspirin or NSAID use.  She is status post cholecystectomy.  Review of Systems Pertinent positive and negative review of systems were noted in the above HPI section.  All other review of systems was otherwise negative.  Outpatient Encounter Prescriptions as of 08/22/2016  Medication Sig  . acetaminophen (TYLENOL) 650 MG CR tablet Reported on 09/13/2015 4 TABS DAILY  . cycloSPORINE (RESTASIS) 0.05 % ophthalmic emulsion Place 1 drop into the left eye 2 (two) times daily.  Marland Kitchen estradiol (ESTRACE) 0.5 MG tablet Take 1 tablet (0.5 mg total) by mouth daily. (Patient taking differently: 0.5 mg. Every  other day.)  . HYDROcodone-acetaminophen (NORCO) 10-325 MG per tablet Take 0.5 tablets by mouth every 6 (six) hours as needed.   . Multiple Vitamins-Minerals (PRESERVISION AREDS 2 PO) Take 2 tablets by mouth 2 (two) times daily.   Marland Kitchen omeprazole (PRILOSEC) 20 MG capsule TAKE 1 CAPSULE TWICE DAILY BEFORE MEALS  . polyethylene glycol (MIRALAX / GLYCOLAX) packet Take 17 g by mouth daily as needed.   . Psyllium (METAMUCIL) 30.9 % POWD Take by mouth. One tsp daily at bedtime   . sucralfate (CARAFATE) 1 g tablet TAKE 1 TABLET 4 TIMES DAILYWITH MEALS AND AT BEDTIME  . SYNTHROID 88 MCG tablet TAKE 1 TABLET DAILY (NEED  THYROID CHECKED IN NOVEMBER  . terbinafine (LAMISIL) 250 MG tablet TAKE 1 TABLET BY MOUTH EVERY DAY  . zolpidem (AMBIEN) 10 MG tablet Take 1 tablet (10 mg total) by mouth at bedtime as needed. for sleep  . omeprazole (PRILOSEC) 40 MG capsule Take 1 capsule (40 mg total) by mouth 2 (two) times daily.  . [DISCONTINUED] celecoxib (CELEBREX) 200 MG capsule Take 1 capsule by mouth daily.  . [DISCONTINUED] omega-3 acid ethyl esters (LOVAZA) 1 G capsule Take by mouth 2 (two) times daily.  . [DISCONTINUED] triamcinolone cream (KENALOG) 0.1 % Apply 1 application topically 2 (two) times daily.   No facility-administered encounter medications on file as of 08/22/2016.    Allergies  Allergen Reactions  . Aspirin     REACTION: Hives   Patient Active Problem List   Diagnosis Date Noted  . Osteopenia 09/13/2015  .  Ovarian cyst, right 08/16/2014  . Sore throat 07/27/2013  . NSAID long-term use 07/27/2013  . Acute upper respiratory infections of unspecified site 07/23/2013  . Vertigo 07/23/2013  . Vaginal atrophy 08/07/2012  . Weight gain 07/24/2012  . Postmenopausal HRT (hormone replacement therapy) 07/24/2012  . Abdominal pain, epigastric 08/22/2009  . Hypothyroidism 07/20/2008   Social History   Social History  . Marital status: Married    Spouse name: N/A  . Number of children: 1  .  Years of education: N/A   Occupational History  . Retired Retired   Social History Main Topics  . Smoking status: Former Smoker    Packs/day: 1.50    Years: 40.00    Types: Cigarettes    Quit date: 08/28/2003  . Smokeless tobacco: Never Used  . Alcohol use No  . Drug use: No  . Sexual activity: Yes    Birth control/ protection: Surgical     Comment: HYSTERECTOMY   Other Topics Concern  . Not on file   Social History Narrative  . No narrative on file    Ms. Konen's family history includes Cancer in her father and mother; Diabetes in her brother, mother, and sister; Heart disease in her brother, daughter, and father; Hypertension in her brother and sister.      Objective:    Vitals:   08/22/16 1421  BP: 134/72  Pulse: 76    Physical Exam  well-developed older email in no acute distress, blood pressure 134/72 pulse 76, height 5 foot 2, weight 152, BMI 27.2. HEENT; nontraumatic normocephalic EOMI PERRLA sclera anicteric, Cardiovascular; regular rate and rhythm with S1-S2 no murmur or gallop, Pulmonary; clear bilaterally, Abdomen ;soft, is tender across the upper abdomen there is no palpable mass or hepatosplenomegaly bowel sounds are present, Rectal ;exam not done, Extremities ;no clubbing cyanosis or edema skin warm and dry, Neuropsych ;mood and affect appropriate       Assessment & Plan:   #44  71 year old white female with 4-6 week history of persistent upper abdominal pain bloating and indigestion. This is in the setting of chronic PPI and Carafate use and previous history of peptic ulcer disease. Rule out recurrent peptic ulcer disease, rule out malignancy,  #2 status post cholecystectomy #3 chronic GERD #4 remote history of adenomatous colon polyps, negative colonoscopy 2014  Plan; Will  check CBC with differential, see met, lipase, Schedule for CT of the abdomen and pelvis with contrast. We will also schedule for EGD with Dr. Henrene Pastor. Procedure discussed in  detail with patient including risks and benefits and she is agreeable to proceed. If CT scan shows other findings this can be canceled. Increase omeprazole to 40 mg by mouth twice daily, new prescription sent Continue Carafate 1 g by mouth 3 times a day  Alfredia Ferguson PA-C 08/22/2016   Cc: Eulas Post, MD

## 2016-08-22 NOTE — Patient Instructions (Signed)
You have been scheduled for an endoscopy. Please follow written instructions given to you at your visit today. If you use inhalers (even only as needed), please bring them with you on the day of your procedure. Your physician has requested that you go to www.startemmi.com and enter the access code given to you at your visit today. This web site gives a general overview about your procedure. However, you should still follow specific instructions given to you by our office regarding your preparation for the procedure.  Increase Omeprazole to 40 mg twice a day.   Continue Carafate 1 gm 3 times a day between meals.   Your physician has requested that you go to the basement for lab work before leaving today.  You have been scheduled for a CT scan of the abdomen and pelvis at [place] ([address]---this is in the same building as Press [facility]).   You are scheduled on 08-27-16 at 3:15 pm. You should arrive 15 minutes prior to your appointment time for registration. Please follow the written instructions below on the day of your exam:  WARNING: IF YOU ARE ALLERGIC TO IODINE/X-RAY DYE, PLEASE NOTIFY RADIOLOGY IMMEDIATELY AT [phone]! YOU WILL BE GIVEN A 13 HOUR PREMEDICATION PREP.  1) Do not eat anything after 11:15 am (4 hours prior to your test) 2) You have been given 2 bottles of oral contrast to drink. The solution may taste               better if refrigerated, but do NOT add ice or any other liquid to this solution. Shake             well before drinking.    Drink 1 bottle of contrast @ 1:15 pm (2 hours prior to your exam)  Drink 1 bottle of contrast @ 2:15 pm (1 hour prior to your exam)  You may take any medications as prescribed with a small amount of water except for the following: Metformin, Glucophage, Glucovance, Avandamet, Riomet, Fortamet, Actoplus Met, Janumet, Glumetza or Metaglip. The above medications must be held the day of the exam AND 48 hours after  the exam.  The purpose of you drinking the oral contrast is to aid in the visualization of your intestinal tract. The contrast solution may cause some diarrhea. Before your exam is started, you will be given a small amount of fluid to drink. Depending on your individual set of symptoms, you may also receive an intravenous injection of x-ray contrast/dye. Plan on being at [facility] for 30 minutes or longer, depending on the type of exam you are having performed.  This test typically takes 30-45 minutes to complete.  If you have any questions regarding your exam or if you need to reschedule, you may call the CT department at [phone] between the hours of 8:00 am and 5:00 pm, Monday-Friday.  ________________________________________________________________________

## 2016-08-23 DIAGNOSIS — M5442 Lumbago with sciatica, left side: Secondary | ICD-10-CM | POA: Diagnosis not present

## 2016-08-23 NOTE — Progress Notes (Signed)
Initial assessment and plans reviewed 

## 2016-08-27 ENCOUNTER — Ambulatory Visit (INDEPENDENT_AMBULATORY_CARE_PROVIDER_SITE_OTHER)
Admission: RE | Admit: 2016-08-27 | Discharge: 2016-08-27 | Disposition: A | Discharge status: Home / Self Care | Payer: Medicare Other | Source: Ambulatory Visit | Attending: Physician Assistant | Admitting: Physician Assistant

## 2016-08-27 DIAGNOSIS — K279 Peptic ulcer, site unspecified, unspecified as acute or chronic, without hemorrhage or perforation: Secondary | ICD-10-CM

## 2016-08-27 DIAGNOSIS — R109 Unspecified abdominal pain: Secondary | ICD-10-CM | POA: Diagnosis not present

## 2016-08-27 DIAGNOSIS — K449 Diaphragmatic hernia without obstruction or gangrene: Secondary | ICD-10-CM | POA: Diagnosis not present

## 2016-08-27 DIAGNOSIS — R1013 Epigastric pain: Secondary | ICD-10-CM

## 2016-08-27 DIAGNOSIS — R140 Abdominal distension (gaseous): Secondary | ICD-10-CM

## 2016-08-27 DIAGNOSIS — K5730 Diverticulosis of large intestine without perforation or abscess without bleeding: Secondary | ICD-10-CM | POA: Diagnosis not present

## 2016-08-27 DIAGNOSIS — K760 Fatty (change of) liver, not elsewhere classified: Secondary | ICD-10-CM | POA: Diagnosis not present

## 2016-08-27 DIAGNOSIS — K30 Functional dyspepsia: Secondary | ICD-10-CM

## 2016-08-27 MED ORDER — IOPAMIDOL (ISOVUE-300) INJECTION 61%
100.0000 mL | Freq: Once | INTRAVENOUS | Status: AC | PRN
  Administered 2016-08-27: 100 mL via INTRAVENOUS

## 2016-08-30 ENCOUNTER — Telehealth: Payer: Self-pay | Admitting: Physician Assistant

## 2016-08-30 NOTE — Telephone Encounter (Signed)
She is aware of her unremarkable CT. She is not worse but her symptoms of bloating and discomfort have not resolved. She will go forward with the EGD unless you do not feel it is needed.

## 2016-08-30 NOTE — Telephone Encounter (Signed)
I think she should go forward with EGD

## 2016-08-31 ENCOUNTER — Other Ambulatory Visit: Payer: Self-pay | Admitting: Internal Medicine

## 2016-08-31 NOTE — Telephone Encounter (Signed)
Advised the patient of this.

## 2016-09-03 ENCOUNTER — Encounter: Payer: Self-pay | Admitting: Family Medicine

## 2016-09-03 ENCOUNTER — Ambulatory Visit (INDEPENDENT_AMBULATORY_CARE_PROVIDER_SITE_OTHER): Payer: Medicare Other | Admitting: Family Medicine

## 2016-09-03 VITALS — BP 120/70 | HR 67 | Temp 98.1°F | Wt 152.5 lb

## 2016-09-03 DIAGNOSIS — R05 Cough: Secondary | ICD-10-CM

## 2016-09-03 DIAGNOSIS — R059 Cough, unspecified: Secondary | ICD-10-CM

## 2016-09-03 NOTE — Patient Instructions (Signed)
Consider OTC anti-histamine such as Claritan or Allegra Would also consider OTC Nasacort or Flonase.

## 2016-09-03 NOTE — Progress Notes (Signed)
Pre visit review using our clinic review tool, if applicable. No additional management support is needed unless otherwise documented below in the visit note. 

## 2016-09-03 NOTE — Progress Notes (Signed)
Subjective:     [name], female   DOB: [birth year], 71 y.o.   MRN: [id number]  HPI Patient seen for acute visit with 2 week history of cough and nasal congestion. She denies any fever. Using over-the-counter Mucinex without much improvement. Her cough has been dry. She denies any dyspnea. She's had some postnasal drip. No sore throat. No body aches. Husband has similar symptoms. She's not taking any allergy medications thus far  Past Medical History:  Diagnosis Date  . Abdominal pain, epigastric 08/22/2009  . Adenomatous colon polyp   . Chronic low back pain   . Diverticulosis   . Duodenal ulcer   . GERD (gastroesophageal reflux disease)   . Hemorrhoids   . HYPOTHYROIDISM 07/20/2008  . Insomnia   . NSVD (normal spontaneous vaginal delivery)    X2   Past Surgical History:  Procedure Laterality Date  . ABDOMINAL HYSTERECTOMY     TVH  . BACK SURGERY     RUPTURED DISC  . BLEPHAROPLASTY    . CHOLECYSTECTOMY    . COLONOSCOPY  JAN 2014   NEXT COLONOSCOPY IN 10 YEARS  . DILATION AND CURETTAGE OF UTERUS    . EYE SURGERY  2008   BILATERAL CATARACT     reports that she quit smoking about 13 years ago. Her smoking use included Cigarettes. She has a 60.00 pack-year smoking history. She has never used smokeless tobacco. She reports that she does not drink alcohol or use drugs. family history includes Cancer in her father and mother; Diabetes in her brother, mother, and sister; Heart disease in her brother, daughter, and father; Hypertension in her brother and sister. Allergies  Allergen Reactions  . Aspirin     REACTION: Hives     Review of Systems  Constitutional: Negative for chills and fever.  HENT: Positive for congestion. Negative for sore throat.   Respiratory: Positive for cough. Negative for wheezing.        Objective:   Physical Exam  Constitutional: She appears well-developed and well-nourished.  HENT:  Right Ear: External ear normal.  Left Ear:  External ear normal.  Mouth/Throat: Oropharynx is clear and moist.  Neck: Neck supple.  Cardiovascular: Normal rate and regular rhythm.   Pulmonary/Chest: Effort normal and breath sounds normal. No respiratory distress. She has no wheezes. She has no rales.  Lymphadenopathy:    She has no cervical adenopathy.       Assessment:     Patient presents with cough and nasal congestion-different is post viral versus allergic    Plan:     -Suggest she try over-the-counter antihistamine and also consider Flonase or Nasacort AQ -Follow-up immediately for any fever or worsening symptoms  Eulas Post MD [facility]

## 2016-09-07 DIAGNOSIS — M5416 Radiculopathy, lumbar region: Secondary | ICD-10-CM | POA: Diagnosis not present

## 2016-09-07 DIAGNOSIS — M47816 Spondylosis without myelopathy or radiculopathy, lumbar region: Secondary | ICD-10-CM | POA: Diagnosis not present

## 2016-09-07 DIAGNOSIS — M7989 Other specified soft tissue disorders: Secondary | ICD-10-CM | POA: Diagnosis not present

## 2016-09-07 DIAGNOSIS — G5702 Lesion of sciatic nerve, left lower limb: Secondary | ICD-10-CM | POA: Diagnosis not present

## 2016-09-07 DIAGNOSIS — M5136 Other intervertebral disc degeneration, lumbar region: Secondary | ICD-10-CM | POA: Diagnosis not present

## 2016-09-07 DIAGNOSIS — G5701 Lesion of sciatic nerve, right lower limb: Secondary | ICD-10-CM | POA: Diagnosis not present

## 2016-09-07 DIAGNOSIS — M79662 Pain in left lower leg: Secondary | ICD-10-CM | POA: Diagnosis not present

## 2016-09-12 ENCOUNTER — Other Ambulatory Visit: Payer: Self-pay | Admitting: Gynecology

## 2016-09-26 ENCOUNTER — Ambulatory Visit (INDEPENDENT_AMBULATORY_CARE_PROVIDER_SITE_OTHER): Payer: Medicare Other | Admitting: Gynecology

## 2016-09-26 ENCOUNTER — Encounter: Payer: Self-pay | Admitting: Gynecology

## 2016-09-26 VITALS — BP 136/82 | Ht 63.0 in | Wt 150.2 lb

## 2016-09-26 DIAGNOSIS — M8588 Other specified disorders of bone density and structure, other site: Secondary | ICD-10-CM | POA: Diagnosis not present

## 2016-09-26 DIAGNOSIS — M8580 Other specified disorders of bone density and structure, unspecified site: Secondary | ICD-10-CM

## 2016-09-26 DIAGNOSIS — Z01419 Encounter for gynecological examination (general) (routine) without abnormal findings: Secondary | ICD-10-CM

## 2016-09-26 DIAGNOSIS — Z79890 Hormone replacement therapy (postmenopausal): Secondary | ICD-10-CM | POA: Diagnosis not present

## 2016-09-26 DIAGNOSIS — M5441 Lumbago with sciatica, right side: Secondary | ICD-10-CM | POA: Diagnosis not present

## 2016-09-26 DIAGNOSIS — Z780 Asymptomatic menopausal state: Secondary | ICD-10-CM

## 2016-09-26 NOTE — Progress Notes (Signed)
Cassidy Norton 1945-08-17 [id number]   History:    71 y.o.  for annual gyn exam who has suffered from hot flashes and vaginal dryness in the past.. In 2016 Dr. Henrene Pastor gastroenterologist that diagnosed her with duodenal ulcer and was treated. She also had a colonoscopy in 2016 whereby benign polyps were removed and she currently is on a 5 year recall.Her bone density study in May 2016 demonstrated the lowest T score was at the left femoral neck with a value of -1.2 there was statistically significant decrease in bone mineralization of -4% when compared with 2014. She is currently taking her calcium and vitamin D.Patient has been on oral Estrace 0.5 mg every other day for menopausal symptoms. She has attempted several times to discontinue but cannot tolerate the hot flashes. Patient with no prior history of abnormal Pap smears.Dr. Cherly Anderson has been doing her lab work and monitor her hypothyroidism. Patient with no past history of any abnormal Pap smears   Past medical history,surgical history, family history and social history were all reviewed and documented in the EPIC chart.  Gynecologic History No LMP recorded. Patient has had a hysterectomy. Contraception: post menopausal status Last Pap: 2012. Results were: normal Last mammogram: 2018. Results were: normal  Obstetric History OB History  Gravida Para Term Preterm AB Living  3 2 2   1 2   SAB TAB Ectopic Multiple Live Births  1       2    # Outcome Date GA Lbr Len/2nd Weight Sex Delivery Anes PTL Lv  3 SAB           2 Term     F Vag-Spont  N LIV  1 Term     M Vag-Spont  N LIV     Birth Comments: has died in a car accident at 46 yrs       ROS: A ROS was performed and pertinent positives and negatives are included in the history.  GENERAL: No fevers or chills. HEENT: No change in vision, no earache, sore throat or sinus congestion. NECK: No pain or stiffness. CARDIOVASCULAR: No chest pain or pressure. No palpitations. PULMONARY:  No shortness of breath, cough or wheeze. GASTROINTESTINAL: No abdominal pain, nausea, vomiting or diarrhea, melena or bright red blood per rectum. GENITOURINARY: No urinary frequency, urgency, hesitancy or dysuria. MUSCULOSKELETAL: No joint or muscle pain, no back pain, no recent trauma. DERMATOLOGIC: No rash, no itching, no lesions. ENDOCRINE: No polyuria, polydipsia, no heat or cold intolerance. No recent change in weight. HEMATOLOGICAL: No anemia or easy bruising or bleeding. NEUROLOGIC: No headache, seizures, numbness, tingling or weakness. PSYCHIATRIC: No depression, no loss of interest in normal activity or change in sleep pattern.     Exam: chaperone present  BP 136/82   Ht 5\' 3"  (1.6 m)   Wt 150 lb 3.2 oz (68.1 kg)   BMI 26.61 kg/m   Body mass index is 26.61 kg/m.  General appearance : Well developed well nourished female. No acute distress HEENT: Eyes: no retinal hemorrhage or exudates,  Neck supple, trachea midline, no carotid bruits, no thyroidmegaly Lungs: Clear to auscultation, no rhonchi or wheezes, or rib retractions  Heart: Regular rate and rhythm, no murmurs or gallops Breast:Examined in sitting and supine position were symmetrical in appearance, no palpable masses or tenderness,  no skin retraction, no nipple inversion, no nipple discharge, no skin discoloration, no axillary or supraclavicular lymphadenopathy Abdomen: no palpable masses or tenderness, no rebound or guarding Extremities: no edema  or skin discoloration or tenderness  Pelvic:  Bartholin, Urethra, Skene Glands: Within normal limits             Vagina: No gross lesions or discharge, atrophic changes  Cervix: No gross lesions or discharge  Uterus  anteverted, normal size, shape and consistency, non-tender and mobile  Adnexa  Without masses or tenderness  Anus and perineum  normal   Rectovaginal  normal sphincter tone without palpated masses or tenderness             Hemoccult PCP provides      Assessment/Plan:  71 y.o. female for annual exam with history of osteopenia will be scheduling bone density study here in the office the next few weeks. We discussed importance of calcium and vitamin D and weightbearing exercises for osteoporosis prevention. Patient no longer needs Pap smear. PCP is been doing her blood work. Patient stated several times she has tried discontinued her HRT for which she takes Estrace 0.5 mg every other day. Once again we went to a detailed discussion of potential risk of breast cancer as well as DVT and pulmonary embolism. She fully accepts and understands.    an additional 10 minutes was spent discussing hormone replacement therapy potential risk and side effects as well as reviewing her bone density studies and recommendations.   Terrance Mass MD, 10:33 AM 09/26/2016

## 2016-09-26 NOTE — Patient Instructions (Signed)
Bone Densitometry Bone densitometry is an imaging test that uses a special X-ray to measure the amount of calcium and other minerals in your bones (bone density). This test is also known as a bone mineral density test or dual-energy X-ray absorptiometry (DXA). The test can measure bone density at your hip and your spine. It is similar to having a regular X-ray. You may have this test to:  Diagnose a condition that causes weak or thin bones (osteoporosis).  Predict your risk of a broken bone (fracture).  Determine how well osteoporosis treatment is working. Tell a health care provider about:  Any allergies you have.  All medicines you are taking, including vitamins, herbs, eye drops, creams, and over-the-counter medicines.  Any problems you or family members have had with anesthetic medicines.  Any blood disorders you have.  Any surgeries you have had.  Any medical conditions you have.  Possibility of pregnancy.  Any other medical test you had within the previous 14 days that used contrast material. What are the risks? Generally, this is a safe procedure. However, problems can occur and may include the following:  This test exposes you to a very small amount of radiation.  The risks of radiation exposure may be greater to unborn children. What happens before the procedure?  Do not take any calcium supplements for 24 hours before having the test. You can otherwise eat and drink what you usually do.  Take off all metal jewelry, eyeglasses, dental appliances, and any other metal objects. What happens during the procedure?  You may lie on an exam table. There will be an X-ray generator below you and an imaging device above you.  Other devices, such as boxes or braces, may be used to position your body properly for the scan.  You will need to lie still while the machine slowly scans your body.  The images will show up on a computer monitor. What happens after the  procedure? You may need more testing at a later time. This information is not intended to replace advice given to you by your health care provider. Make sure you discuss any questions you have with your health care provider. Document Released: 05/22/2004 Document Revised: 10/06/2015 Document Reviewed: 10/08/2013 Elsevier Interactive Patient Education  2017 [employer]vier [employer]  

## 2016-10-09 ENCOUNTER — Ambulatory Visit (AMBULATORY_SURGERY_CENTER): Payer: Medicare Other | Admitting: Internal Medicine

## 2016-10-09 ENCOUNTER — Encounter: Payer: Self-pay | Admitting: Internal Medicine

## 2016-10-09 VITALS — BP 117/69 | HR 60 | Temp 95.9°F | Resp 15 | Ht 62.75 in | Wt 152.0 lb

## 2016-10-09 DIAGNOSIS — K219 Gastro-esophageal reflux disease without esophagitis: Secondary | ICD-10-CM | POA: Diagnosis not present

## 2016-10-09 DIAGNOSIS — R1013 Epigastric pain: Secondary | ICD-10-CM

## 2016-10-09 DIAGNOSIS — E039 Hypothyroidism, unspecified: Secondary | ICD-10-CM | POA: Diagnosis not present

## 2016-10-09 DIAGNOSIS — K269 Duodenal ulcer, unspecified as acute or chronic, without hemorrhage or perforation: Secondary | ICD-10-CM

## 2016-10-09 NOTE — Progress Notes (Signed)
Called to room to assist during endoscopic procedure.  Patient ID and intended procedure confirmed with present staff. Received instructions for my participation in the procedure from the performing physician.  

## 2016-10-09 NOTE — Progress Notes (Signed)
Report to PACU, RN, vss, BBS= Clear.  

## 2016-10-09 NOTE — Op Note (Signed)
[facility] Patient Name: Cassidy Norton Procedure Date: 10/09/2016 9:56 AM MRN: [id number] Endoscopist: Docia Chuck. Henrene Pastor , MD Age: 71 Referring MD:  Date of Birth: 11/07/1945 Gender: Female Account #: [id number] Procedure:                Upper GI endoscopy, with biopsy Indications:              Epigastric abdominal pain, Esophageal reflux,                            Abdominal bloating Medicines:                Monitored Anesthesia Care Procedure:                Pre-Anesthesia Assessment:                           - Prior to the procedure, a History and Physical                            was performed, and patient medications and                            allergies were reviewed. The patient's tolerance of                            previous anesthesia was also reviewed. The risks                            and benefits of the procedure and the sedation                            options and risks were discussed with the patient.                            All questions were answered, and informed consent                            was obtained. Prior Anticoagulants: The patient has                            taken no previous anticoagulant or antiplatelet                            agents. ASA Grade Assessment: II - A patient with                            mild systemic disease. After reviewing the risks                            and benefits, the patient was deemed in                            satisfactory condition to undergo the procedure.  After obtaining informed consent, the endoscope was                            passed under direct vision. Throughout the                            procedure, the patient's blood pressure, pulse, and                            oxygen saturations were monitored continuously. The                            Endoscope was introduced through the mouth, and                            advanced to the second part of  duodenum. The upper                            GI endoscopy was accomplished without difficulty.                            The patient tolerated the procedure well. Scope In: Scope Out: Findings:                 The esophagus was normal.                           The stomach was normal Small hiatal hernia present.                           Two non-bleeding superficial duodenal ulcers were                            found in the duodenal bulb. The largest lesion was                            4 mm in largest dimension. Biopsies were taken with                            a cold forceps for Helicobacter pylori testing                            using CLOtest.                           The examined duodenum was otherwise normal.                           The cardia and gastric fundus were normal on                            retroflexion. Complications:            No immediate complications. Estimated Blood Loss:     Estimated blood loss: none. Impression:               -  Normal esophagus.                           - Normal stomach.                           - Multiple non-bleeding duodenal ulcers. Biopsied.                           - Normal examined duodenum. Recommendation:           - Please make sure that you're not taking any                            NSAIDs or similar products.Tylenol in recommended                            dosages okay.                           - Resume previous diet.                           - Continue present medications.                           - Await CLO results.                           - Please follow-up with Dr. Henrene Pastor in the office in                            about 8 weeks Docia Chuck. Henrene Pastor, MD 10/09/2016 10:16:51 AM This report has been signed electronically.

## 2016-10-09 NOTE — Patient Instructions (Signed)
Discharge instructions given. Biopsies taken. Resume previous medications. No NSAIDS. Use Tylenol for recommended doses. YOU HAD AN ENDOSCOPIC PROCEDURE TODAY AT [facility]:   Refer to the procedure report that was given to you for any specific questions about what was found during the examination.  If the procedure report does not answer your questions, please call your gastroenterologist to clarify.  If you requested that your care partner not be given the details of your procedure findings, then the procedure report has been included in a sealed envelope for you to review at your convenience later.  YOU SHOULD EXPECT: Some feelings of bloating in the abdomen. Passage of more gas than usual.  Walking can help get rid of the air that was put into your GI tract during the procedure and reduce the bloating. If you had a lower endoscopy (such as a colonoscopy or flexible sigmoidoscopy) you may notice spotting of blood in your stool or on the toilet paper. If you underwent a bowel prep for your procedure, you may not have a normal bowel movement for a few days.  Please Note:  You might notice some irritation and congestion in your nose or some drainage.  This is from the oxygen used during your procedure.  There is no need for concern and it should clear up in a day or so.  SYMPTOMS TO REPORT IMMEDIATELY:    Following upper endoscopy (EGD)  Vomiting of blood or coffee ground material  New chest pain or pain under the shoulder blades  Painful or persistently difficult swallowing  New shortness of breath  Fever of 100F or higher  Black, tarry-looking stools  For urgent or emergent issues, a gastroenterologist can be reached at any hour by calling [phone].   DIET:  We do recommend a small meal at first, but then you may proceed to your regular diet.  Drink plenty of fluids but you should avoid alcoholic beverages for 24 hours.  ACTIVITY:  You should plan to take it easy  for the rest of today and you should NOT DRIVE or use heavy machinery until tomorrow (because of the sedation medicines used during the test).    FOLLOW UP: Our staff will call the number listed on your records the next business day following your procedure to check on you and address any questions or concerns that you may have regarding the information given to you following your procedure. If we do not reach you, we will leave a message.  However, if you are feeling well and you are not experiencing any problems, there is no need to return our call.  We will assume that you have returned to your regular daily activities without incident.  If any biopsies were taken you will be contacted by phone or by letter within the next 1-3 weeks.  Please call us at [phone] if you have not heard about the biopsies in 3 weeks.    SIGNATURES/CONFIDENTIALITY: You and/or your care partner have signed paperwork which will be entered into your electronic medical record.  These signatures attest to the fact that that the information above on your After Visit Summary has been reviewed and is understood.  Full responsibility of the confidentiality of this discharge information lies with you and/or your care-partner.

## 2016-10-10 ENCOUNTER — Telehealth: Payer: Self-pay | Admitting: *Deleted

## 2016-10-10 LAB — HELICOBACTER PYLORI SCREEN-BIOPSY: UREASE: NEGATIVE

## 2016-10-10 NOTE — Telephone Encounter (Signed)
  Follow up Call-  Call back number 10/09/2016 11/24/2014  Post procedure Call Back phone  # [phone]  Permission to leave phone message Yes Yes  Some recent data might be hidden     Patient questions:  Do you have a fever, pain , or abdominal swelling? No. Pain Score  0 *  Have you tolerated food without any problems? Yes.    Have you been able to return to your normal activities? Yes.    Do you have any questions about your discharge instructions: Diet   No. Medications  No. Follow up visit  No.  Do you have questions or concerns about your Care? No.  Actions: * If pain score is 4 or above: No action needed, pain <4.

## 2016-10-17 ENCOUNTER — Other Ambulatory Visit: Payer: Self-pay | Admitting: Physician Assistant

## 2016-11-05 DIAGNOSIS — M722 Plantar fascial fibromatosis: Secondary | ICD-10-CM | POA: Diagnosis not present

## 2016-11-05 DIAGNOSIS — R202 Paresthesia of skin: Secondary | ICD-10-CM | POA: Diagnosis not present

## 2016-12-03 ENCOUNTER — Other Ambulatory Visit: Payer: Self-pay | Admitting: Physician Assistant

## 2016-12-11 ENCOUNTER — Ambulatory Visit (INDEPENDENT_AMBULATORY_CARE_PROVIDER_SITE_OTHER): Payer: Medicare Other | Admitting: Internal Medicine

## 2016-12-11 ENCOUNTER — Encounter: Payer: Self-pay | Admitting: Internal Medicine

## 2016-12-11 ENCOUNTER — Encounter (INDEPENDENT_AMBULATORY_CARE_PROVIDER_SITE_OTHER): Payer: Self-pay

## 2016-12-11 VITALS — BP 128/68 | HR 72 | Ht 62.75 in | Wt 150.4 lb

## 2016-12-11 DIAGNOSIS — K269 Duodenal ulcer, unspecified as acute or chronic, without hemorrhage or perforation: Secondary | ICD-10-CM

## 2016-12-11 DIAGNOSIS — Z860100 Personal history of colon polyps, unspecified: Secondary | ICD-10-CM

## 2016-12-11 DIAGNOSIS — R1013 Epigastric pain: Secondary | ICD-10-CM | POA: Diagnosis not present

## 2016-12-11 MED ORDER — OMEPRAZOLE 40 MG PO CPDR: 40.0000 mg | DELAYED_RELEASE_CAPSULE | Freq: Two times a day (BID) | ORAL | 3 refills | Status: DC

## 2016-12-11 NOTE — Progress Notes (Signed)
HISTORY OF PRESENT ILLNESS:  Cassidy Norton is a 71 y.o. female with past medical history as listed below. GI diagnoses include GERD, duodenal ulcer, and colon polyps. She was last evaluated 10/09/2016 when she underwent upper endoscopy to evaluate epigastric pain, bloating, and reflux symptoms. She was found to have 2 nonbleeding superficial duodenal ulcers in the bulb. Testing for Helicobacter pylori was negative. Omeprazole was increased to 40 mg twice daily. She discontinued NSAIDs. She presents today for follow-up. Patient reports that her symptoms have improved significantly until last week when she ran out of her omeprazole prescription. She does notice that upper abdominal burning discomfort is improved with meals. She has been avoiding NSAIDs as requested. She does take Tylenol 650 mg 4 times daily for her pain. She does not use alcohol. No new complaints. She inquires about timing of surveillance colonoscopy.  REVIEW OF SYSTEMS:  All non-GI ROS negative except for arthritis, back pain, sleeping problems  Past Medical History:  Diagnosis Date  . Abdominal pain, epigastric 08/22/2009  . Adenomatous colon polyp   . Chronic low back pain   . Diverticulosis   . Duodenal ulcer   . GERD (gastroesophageal reflux disease)   . Hemorrhoids   . Hiatal hernia   . HYPOTHYROIDISM 07/20/2008  . Insomnia   . NSVD (normal spontaneous vaginal delivery)    X2    Past Surgical History:  Procedure Laterality Date  . ABDOMINAL HYSTERECTOMY     TVH  . BACK SURGERY     RUPTURED DISC  . BLEPHAROPLASTY    . CHOLECYSTECTOMY    . COLONOSCOPY  JAN 2014   NEXT COLONOSCOPY IN 10 YEARS  . DILATION AND CURETTAGE OF UTERUS    . EYE SURGERY  2008   BILATERAL CATARACT     Social History ZELENE BARGA  reports that she quit smoking about 13 years ago. Her smoking use included Cigarettes. She has a 60.00 pack-year smoking history. She has never used smokeless tobacco. She reports that she does not drink  alcohol or use drugs.  family history includes Cancer in her father and mother; Diabetes in her brother, mother, and sister; Heart disease in her brother, daughter, and father; Hypertension in her brother and sister.  Allergies  Allergen Reactions  . Aspirin     REACTION: Hives       PHYSICAL EXAMINATION: Vital signs: BP 128/68   Pulse 72   Ht 5' 2.75" (1.594 m)   Wt 150 lb 6 oz (68.2 kg)   BMI 26.85 kg/m   Constitutional: generally well-appearing, no acute distress Psychiatric: alert and oriented x3, cooperative Eyes: extraocular movements intact, anicteric, conjunctiva pink Mouth: oral pharynx moist, no lesions Neck: supple no lymphadenopathy Cardiovascular: heart regular rate and rhythm, no murmur Lungs: clear to auscultation bilaterally Abdomen: soft, nontender, nondistended, no obvious ascites, no peritoneal signs, normal bowel sounds, no organomegaly Rectal:Omitted Extremities: no clubbing, cyanosis, or lower extremity edema bilaterally Skin: no lesions on visible extremities Neuro: No focal deficits.   ASSESSMENT:  #1. 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 Symptoms improved when on PPI #2. GERD. Recurrent symptoms off PPI #3. History of adenomatous colon polyps. Last examination January 2014 negative for neoplasia. Follow-up in 10 years recommended   PLAN:  #1. Resume omeprazole 40 mg twice daily #2. Continue to avoid NSAIDs #3. Surveillance colonoscopy 2024 #4. Routine GI follow-up one year. Follow-up in the interim as needed. Parameters provided regarding the possible need for interval follow-up  15 minutes spent face-to-face  with the patient. Greater than 50% a time use for counseling regarding her ulcer disease, GERD, history of colon polyps, there management, follow-up, and surveillance.

## 2016-12-11 NOTE — Patient Instructions (Signed)
We have sent the following prescriptions to your mail in pharmacy: omeprazole 40 mg twice daily  If you have not heard from your mail in pharmacy within 1 week or if you have not received your medication in the mail, please contact us at [phone] so we may find out why.  Please follow up with Dr Henrene Pastor in 1 year.  If you are age 73 or older, your body mass index should be between 23-30. Your Body mass index is 26.85 kg/m. If this is out of the aforementioned range listed, please consider follow up with your Primary Care Provider.  If you are age 46 or younger, your body mass index should be between 19-25. Your Body mass index is 26.85 kg/m. If this is out of the aformentioned range listed, please consider follow up with your Primary Care Provider.

## 2016-12-26 ENCOUNTER — Other Ambulatory Visit: Payer: Self-pay | Admitting: Family Medicine

## 2017-02-08 ENCOUNTER — Encounter: Payer: Self-pay | Admitting: Family Medicine

## 2017-02-08 ENCOUNTER — Ambulatory Visit (INDEPENDENT_AMBULATORY_CARE_PROVIDER_SITE_OTHER): Payer: Medicare Other | Admitting: Family Medicine

## 2017-02-08 VITALS — BP 120/70 | HR 73 | Temp 97.9°F | Wt 150.7 lb

## 2017-02-08 DIAGNOSIS — G4452 New daily persistent headache (NDPH): Secondary | ICD-10-CM | POA: Diagnosis not present

## 2017-02-08 DIAGNOSIS — E038 Other specified hypothyroidism: Secondary | ICD-10-CM

## 2017-02-08 DIAGNOSIS — R51 Headache: Secondary | ICD-10-CM | POA: Diagnosis not present

## 2017-02-08 DIAGNOSIS — Z23 Encounter for immunization: Secondary | ICD-10-CM | POA: Diagnosis not present

## 2017-02-08 DIAGNOSIS — R519 Headache, unspecified: Secondary | ICD-10-CM

## 2017-02-08 LAB — TSH: TSH: 4.49 u[IU]/mL (ref 0.35–4.50)

## 2017-02-08 LAB — SEDIMENTATION RATE: Sed Rate: 3 mm/hr (ref 0–30)

## 2017-02-08 NOTE — Progress Notes (Signed)
Subjective:     [name], female   DOB: March 15, 1946, 71 y.o.   MRN: [id number]  HPI Patient seen for routine medical follow-up and also for new problem as below. She has hypothyroidism on replacement and overdue for TSH. Compliant with medication.  She presents with approximately one-month history of left sided headache. Somewhat poorly localized. She describes somewhat of a constant throbbing headache which is around her left parietal and temporal regions. She denies any sudden vision changes. No facial weakness. Vague sensation of occasional numbness involving left side of face and left temporal region.  She also relates frequently feeling "off balance ". She thinks she may have had some decreased hearing over the past 6 months to left ear and occasional unilateral tinnitus on the left side. Denies any appetite or weight changes. No fevers or chills. No focal extremity weakness. No exacerbating or alleviating factors for headache. Non-exertional headaches.  Past Medical History:  Diagnosis Date  . Abdominal pain, epigastric 08/22/2009  . Adenomatous colon polyp   . Chronic low back pain   . Diverticulosis   . Duodenal ulcer   . GERD (gastroesophageal reflux disease)   . Hemorrhoids   . Hiatal hernia   . HYPOTHYROIDISM 07/20/2008  . Insomnia   . NSVD (normal spontaneous vaginal delivery)    X2   Past Surgical History:  Procedure Laterality Date  . ABDOMINAL HYSTERECTOMY     TVH  . BACK SURGERY     RUPTURED DISC  . BLEPHAROPLASTY    . CHOLECYSTECTOMY    . COLONOSCOPY  JAN 2014   NEXT COLONOSCOPY IN 10 YEARS  . DILATION AND CURETTAGE OF UTERUS    . EYE SURGERY  2008   BILATERAL CATARACT     reports that she quit smoking about 13 years ago. Her smoking use included Cigarettes. She has a 60.00 pack-year smoking history. She has never used smokeless tobacco. She reports that she does not drink alcohol or use drugs. family history includes Cancer in her father and  mother; Diabetes in her brother, mother, and sister; Heart disease in her brother, daughter, and father; Hypertension in her brother and sister. Allergies  Allergen Reactions  . Aspirin     REACTION: Hives     Review of Systems  Constitutional: Negative for chills, fatigue and fever.  Eyes: Negative for visual disturbance.  Respiratory: Negative for cough, chest tightness, shortness of breath and wheezing.   Cardiovascular: Negative for chest pain, palpitations and leg swelling.  Neurological: Positive for dizziness and headaches. Negative for seizures, syncope, weakness and light-headedness.       Objective:   Physical Exam  Constitutional: She is oriented to person, place, and time. She appears well-developed and well-nourished.  HENT:  Head: Normocephalic and atraumatic.  Right Ear: External ear normal.  Left Ear: External ear normal.  She has have some tenderness over the left TMJ joint No temporal artery tenderness  Eyes: Pupils are equal, round, and reactive to light.  Neck: Neck supple. No thyromegaly present.  Cardiovascular: Normal rate and regular rhythm.   Pulmonary/Chest: Effort normal and breath sounds normal. No respiratory distress. She has no wheezes. She has no rales.  Lymphadenopathy:    She has no cervical adenopathy.  Neurological: She is alert and oriented to person, place, and time. No cranial nerve deficit. Coordination normal.  Psychiatric: She has a normal mood and affect. Her behavior is normal.       Assessment:     #1  hypothyroidism.  #2 atypical unilateral left-sided headache. She has several concerning features including new onset in patient at age 49, reported intermittent unilateral tinnitus, intermittent vertigo symptoms, reported/subjective hearing loss. No temporal artery tenderness and no sudden vision changes. Need to rule out among other things acoustic neuroma.  Symptoms do not sound typical for trigeminal neuralgia. She does have some  TMJ tenderness but this would not explain her symptoms above    Plan:     -Obtain labs with TSH and sedimentation rate -Set up MRI brain for further assessment -If MRI unremarkable consider neurology consultation    Eulas Post MD [facility] at [facility]

## 2017-02-13 ENCOUNTER — Telehealth: Payer: Self-pay | Admitting: *Deleted

## 2017-02-13 NOTE — Telephone Encounter (Signed)
Patient called wanting to follow up with her MRI patient states she has not heard anything about being scheduled. Please advise [phone]  Patient states if she does not answer leave a detailed message

## 2017-02-13 NOTE — Telephone Encounter (Signed)
Left message on machine for patient to call [facility] at [phone] to schedule.

## 2017-02-20 ENCOUNTER — Ambulatory Visit
Admission: RE | Admit: 2017-02-20 | Discharge: 2017-02-20 | Disposition: A | Discharge status: Home / Self Care | Payer: Medicare Other | Source: Ambulatory Visit | Attending: Family Medicine | Admitting: Family Medicine

## 2017-02-20 DIAGNOSIS — G4452 New daily persistent headache (NDPH): Secondary | ICD-10-CM

## 2017-02-20 DIAGNOSIS — R51 Headache: Secondary | ICD-10-CM | POA: Diagnosis not present

## 2017-02-20 MED ORDER — GADOBENATE DIMEGLUMINE 529 MG/ML IV SOLN
15.0000 mL | Freq: Once | INTRAVENOUS | Status: AC | PRN
  Administered 2017-02-20: 15 mL via INTRAVENOUS

## 2017-03-01 ENCOUNTER — Other Ambulatory Visit: Payer: Self-pay | Admitting: Family Medicine

## 2017-03-01 ENCOUNTER — Other Ambulatory Visit: Payer: Self-pay | Admitting: *Deleted

## 2017-03-01 MED ORDER — ESTRADIOL 0.5 MG PO TABS: 0.5000 mg | ORAL_TABLET | ORAL | 0 refills | Status: DC

## 2017-03-05 DIAGNOSIS — L237 Allergic contact dermatitis due to plants, except food: Secondary | ICD-10-CM | POA: Diagnosis not present

## 2017-03-18 DIAGNOSIS — M5136 Other intervertebral disc degeneration, lumbar region: Secondary | ICD-10-CM | POA: Diagnosis not present

## 2017-03-18 DIAGNOSIS — M542 Cervicalgia: Secondary | ICD-10-CM | POA: Diagnosis not present

## 2017-03-18 DIAGNOSIS — M961 Postlaminectomy syndrome, not elsewhere classified: Secondary | ICD-10-CM | POA: Diagnosis not present

## 2017-05-08 ENCOUNTER — Encounter: Payer: Self-pay | Admitting: Family Medicine

## 2017-05-08 ENCOUNTER — Ambulatory Visit (INDEPENDENT_AMBULATORY_CARE_PROVIDER_SITE_OTHER): Payer: Medicare Other | Admitting: Family Medicine

## 2017-05-08 VITALS — BP 146/70 | HR 73 | Temp 98.2°F | Ht 62.0 in | Wt 152.8 lb

## 2017-05-08 DIAGNOSIS — R202 Paresthesia of skin: Secondary | ICD-10-CM | POA: Diagnosis not present

## 2017-05-08 DIAGNOSIS — Z833 Family history of diabetes mellitus: Secondary | ICD-10-CM | POA: Diagnosis not present

## 2017-05-08 NOTE — Progress Notes (Signed)
Subjective:     [name], female   DOB: 1945/05/17, 71 y.o.   MRN: [id number]  HPI Patient seen with some bilateral lower extremity discomfort. She has some intermittent numbness especially involving the right great toe but to some extent the left foot as well. This past Sunday she noted some swelling of both feet. She has no history of neuropathy. Denies any leg cramps. No claudication symptoms. She denies any restless leg symptoms.  She describes both some numbness in her feet but also frequently a "throbbing" type pain in especially her right toe. She has very strong family history of type 2 diabetes in 2 siblings but she has never had personal history of diabetes. She has had some blood sugars in the prediabetes range. No upper extremity symptoms. Denies low back pain. No urine or stool incontinence  Foot pain seems to be worse at night and also worse when her feet feel cold. No alcohol use. Normal TSH back in September.  Past Medical History:  Diagnosis Date  . Abdominal pain, epigastric 08/22/2009  . Adenomatous colon polyp   . Chronic low back pain   . Diverticulosis   . Duodenal ulcer   . GERD (gastroesophageal reflux disease)   . Hemorrhoids   . Hiatal hernia   . HYPOTHYROIDISM 07/20/2008  . Insomnia   . NSVD (normal spontaneous vaginal delivery)    X2   Past Surgical History:  Procedure Laterality Date  . ABDOMINAL HYSTERECTOMY     TVH  . BACK SURGERY     RUPTURED DISC  . BLEPHAROPLASTY    . CHOLECYSTECTOMY    . COLONOSCOPY  JAN 2014   NEXT COLONOSCOPY IN 10 YEARS  . DILATION AND CURETTAGE OF UTERUS    . EYE SURGERY  2008   BILATERAL CATARACT     reports that she quit smoking about 13 years ago. Her smoking use included cigarettes. She has a 60.00 pack-year smoking history. she has never used smokeless tobacco. She reports that she does not drink alcohol or use drugs. family history includes Cancer in her father and mother; Diabetes in her brother,  mother, and sister; Heart disease in her brother, daughter, and father; Hypertension in her brother and sister. Allergies  Allergen Reactions  . Aspirin     REACTION: Hives     Review of Systems  Constitutional: Negative for chills and fever.  Respiratory: Negative for shortness of breath.   Cardiovascular: Negative for chest pain and palpitations.  Genitourinary: Negative for dysuria.  Musculoskeletal: Negative for back pain.  Skin: Negative for rash.  Neurological: Positive for numbness. Negative for weakness.  Hematological: Negative for adenopathy. Does not bruise/bleed easily.  Psychiatric/Behavioral: Negative for confusion.       Objective:   Physical Exam  Constitutional: She appears well-developed and well-nourished.  Neck: Neck supple. No thyromegaly present.  Cardiovascular: Normal rate and regular rhythm.  She has 2+ dorsalis pedis pulses in both feet. Both feet are warm to touch with good capillary refill.  Pulmonary/Chest: Effort normal and breath sounds normal. No respiratory distress. She has no wheezes. She has no rales.  Musculoskeletal: She exhibits no edema.  Neurological:  She has diminished reflexes knee and ankle bilaterally. She has full strength lower extremities. Impairment with sentry with monofilament especially right great toe but has some impairment in the feet as well  Full-strength with plantar flexion, dorsiflexion, and knee extension bilaterally  Positive Romberg test       Assessment:  Progressive bilateral foot symptoms.  By history, suspicious for sensory neuropathy. No evidence clinically for circulatory problem. Her current symptoms do not sound consistent with restless leg symptoms nor muscle cramps    Plan:     -Check hemoglobin A1c, B12, serum protein electrophoresis. She had recent TSH which was normal. -We discussed possible use of gabapentin for her foot pain pending further evaluation but she declines at this time -Consider  neurology referral depending on results above  Eulas Post MD [facility] at [facility]

## 2017-05-09 LAB — HEMOGLOBIN A1C: Hgb A1c MFr Bld: 5.6 % (ref 4.6–6.5)

## 2017-05-09 LAB — VITAMIN B12: VITAMIN B 12: 145 pg/mL — AB (ref 211–911)

## 2017-05-10 ENCOUNTER — Telehealth: Payer: Self-pay | Admitting: *Deleted

## 2017-05-10 MED ORDER — ESTRADIOL 0.5 MG PO TABS: 0.5000 mg | ORAL_TABLET | ORAL | 1 refills | Status: DC

## 2017-05-10 NOTE — Telephone Encounter (Signed)
Patient called requesting refill on estradiol 0.5 mg tablets, annual scheduled on 09/27/17. Rx sent to mail order [facility] with refills until annual exam

## 2017-05-13 LAB — PROTEIN ELECTROPHORESIS, SERUM
ALPHA 2: 0.8 g/dL (ref 0.5–0.9)
Albumin ELP: 4 g/dL (ref 3.8–4.8)
Alpha 1: 0.3 g/dL (ref 0.2–0.3)
BETA 2: 0.4 g/dL (ref 0.2–0.5)
BETA GLOBULIN: 0.4 g/dL (ref 0.4–0.6)
GAMMA GLOBULIN: 0.6 g/dL — AB (ref 0.8–1.7)
TOTAL PROTEIN: 6.5 g/dL (ref 6.1–8.1)

## 2017-05-16 ENCOUNTER — Ambulatory Visit (INDEPENDENT_AMBULATORY_CARE_PROVIDER_SITE_OTHER): Payer: Medicare Other

## 2017-05-16 DIAGNOSIS — E538 Deficiency of other specified B group vitamins: Secondary | ICD-10-CM

## 2017-05-16 MED ORDER — CYANOCOBALAMIN 1000 MCG/ML IJ SOLN
1000.0000 ug | Freq: Once | INTRAMUSCULAR | Status: AC
Start: 2017-05-16 — End: 2017-05-16
  Administered 2017-05-16: 1000 ug via INTRAMUSCULAR

## 2017-05-16 NOTE — Patient Instructions (Addendum)
Patient had her first Vit B12 injection, pt tolerated the injection well.

## 2017-05-21 NOTE — Progress Notes (Signed)
Reviewed and agree.

## 2017-05-23 ENCOUNTER — Ambulatory Visit: Payer: Medicare Other

## 2017-05-24 ENCOUNTER — Ambulatory Visit (INDEPENDENT_AMBULATORY_CARE_PROVIDER_SITE_OTHER): Payer: Medicare Other

## 2017-05-24 DIAGNOSIS — E538 Deficiency of other specified B group vitamins: Secondary | ICD-10-CM | POA: Diagnosis not present

## 2017-05-24 MED ORDER — CYANOCOBALAMIN 1000 MCG/ML IJ SOLN
1000.0000 ug | Freq: Once | INTRAMUSCULAR | Status: AC
  Administered 2017-05-24: 1000 ug via INTRAMUSCULAR

## 2017-05-30 ENCOUNTER — Ambulatory Visit (INDEPENDENT_AMBULATORY_CARE_PROVIDER_SITE_OTHER): Payer: Medicare Other | Admitting: *Deleted

## 2017-05-30 DIAGNOSIS — E538 Deficiency of other specified B group vitamins: Secondary | ICD-10-CM

## 2017-05-30 MED ORDER — CYANOCOBALAMIN 1000 MCG/ML IJ SOLN
1000.0000 ug | Freq: Once | INTRAMUSCULAR | Status: AC
  Administered 2017-05-30: 1000 ug via INTRAMUSCULAR

## 2017-05-30 NOTE — Progress Notes (Signed)
Per orders of Dr. Burchette, injection of B12 given by Moyses Pavey J Treina Arscott. Patient tolerated injection well. 

## 2017-05-30 NOTE — Progress Notes (Signed)
Cassidy Benton R.

## 2017-06-06 ENCOUNTER — Ambulatory Visit (INDEPENDENT_AMBULATORY_CARE_PROVIDER_SITE_OTHER): Payer: Medicare Other | Admitting: *Deleted

## 2017-06-06 DIAGNOSIS — E538 Deficiency of other specified B group vitamins: Secondary | ICD-10-CM | POA: Diagnosis not present

## 2017-06-06 MED ORDER — CYANOCOBALAMIN 1000 MCG/ML IJ SOLN
1000.0000 ug | Freq: Once | INTRAMUSCULAR | Status: AC
  Administered 2017-06-06: 1000 ug via INTRAMUSCULAR

## 2017-06-06 NOTE — Progress Notes (Signed)
Per orders of Dr. Burchette, injection of B12 given by Chardai Gangemi J Ebone Alcivar. Patient tolerated injection well. 

## 2017-06-11 ENCOUNTER — Ambulatory Visit (INDEPENDENT_AMBULATORY_CARE_PROVIDER_SITE_OTHER): Payer: Medicare Other | Admitting: Podiatry

## 2017-06-11 ENCOUNTER — Ambulatory Visit (INDEPENDENT_AMBULATORY_CARE_PROVIDER_SITE_OTHER): Payer: Medicare Other

## 2017-06-11 DIAGNOSIS — Q828 Other specified congenital malformations of skin: Secondary | ICD-10-CM

## 2017-06-11 DIAGNOSIS — M778 Other enthesopathies, not elsewhere classified: Secondary | ICD-10-CM

## 2017-06-11 DIAGNOSIS — M7750 Other enthesopathy of unspecified foot: Secondary | ICD-10-CM

## 2017-06-11 DIAGNOSIS — M779 Enthesopathy, unspecified: Principal | ICD-10-CM

## 2017-06-11 NOTE — Progress Notes (Signed)
She presents today with a chief complaint of pain to the fifth metatarsal phalangeal joint area bilaterally.  States that about me with shoe gear bowel me with no shoe gear.  Objective: Vital signs are stable alert and oriented x3.  Pulses are palpable.  Neurologic sensorium is intact deep tendon reflexes are intact muscle strength is normal symmetrical bilateral.  Mild tailor's bunion deformities are noted bilateral with boggy fluctuant areas beneath the first metatarsal phalangeal joints.  There is an overlying reactive hyperkeratosis in the same area.  Radiographs taken do not demonstrate any type of osseous abnormalities in these areas.  No acute findings are noted.  Assessment: Capsulitis bursitis poor keratomas.  Plan: Discussed etiology pathology conservative versus surgical therapies at this point I injected dexamethasone and local anesthetic after sterile Betadine skin prep and verbal permission to the fifth metatarsophalangeal joints bilaterally.  We also debrided the reactive hyperkeratosis.  Discussed appropriate shoe gear stretching exercises and ice therapy.

## 2017-06-18 ENCOUNTER — Ambulatory Visit: Payer: Medicare Other | Admitting: Podiatry

## 2017-07-07 ENCOUNTER — Other Ambulatory Visit: Payer: Self-pay | Admitting: Physician Assistant

## 2017-07-11 ENCOUNTER — Ambulatory Visit (INDEPENDENT_AMBULATORY_CARE_PROVIDER_SITE_OTHER): Payer: Medicare Other | Admitting: Family Medicine

## 2017-07-11 DIAGNOSIS — E538 Deficiency of other specified B group vitamins: Secondary | ICD-10-CM

## 2017-07-11 MED ORDER — CYANOCOBALAMIN 1000 MCG/ML IJ SOLN
1000.0000 ug | Freq: Once | INTRAMUSCULAR | Status: AC
  Administered 2017-07-11: 1000 ug via INTRAMUSCULAR

## 2017-07-11 NOTE — Progress Notes (Signed)
Per orders of Dr. Banks, injection of Vitamin B 12 given by Fatime Biswell ANN. Patient tolerated injection well. 

## 2017-07-17 ENCOUNTER — Telehealth: Payer: Self-pay | Admitting: Internal Medicine

## 2017-07-17 MED ORDER — OMEPRAZOLE 40 MG PO CPDR: 40.0000 mg | DELAYED_RELEASE_CAPSULE | Freq: Two times a day (BID) | ORAL | 2 refills | Status: DC

## 2017-07-17 NOTE — Telephone Encounter (Signed)
Spoke with patient and clarified she should get a 90 day supply, taking Omeprazole twice a day.  I resent the rx the correct way and reminded her she needs to call and make an appointment to come for an office visit in July. Patient agreed.

## 2017-07-23 ENCOUNTER — Ambulatory Visit (INDEPENDENT_AMBULATORY_CARE_PROVIDER_SITE_OTHER): Payer: Medicare Other | Admitting: Family Medicine

## 2017-07-23 VITALS — BP 142/76 | HR 70 | Temp 98.0°F | Wt 143.0 lb

## 2017-07-23 DIAGNOSIS — Z719 Counseling, unspecified: Secondary | ICD-10-CM | POA: Diagnosis not present

## 2017-07-23 DIAGNOSIS — J069 Acute upper respiratory infection, unspecified: Secondary | ICD-10-CM

## 2017-07-23 NOTE — Patient Instructions (Signed)
Upper Respiratory Infection, Adult Most upper respiratory infections (URIs) are a viral infection of the air passages leading to the lungs. A URI affects the nose, throat, and upper air passages. The most common type of URI is nasopharyngitis and is typically referred to as "the common cold." URIs run their course and usually go away on their own. Most of the time, a URI does not require medical attention, but sometimes a bacterial infection in the upper airways can follow a viral infection. This is called a secondary infection. Sinus and middle ear infections are common types of secondary upper respiratory infections. Bacterial pneumonia can also complicate a URI. A URI can worsen asthma and chronic obstructive pulmonary disease (COPD). Sometimes, these complications can require emergency medical care and may be life threatening. What are the causes? Almost all URIs are caused by viruses. A virus is a type of germ and can spread from one person to another. What increases the risk? You may be at risk for a URI if:  You smoke.  You have chronic heart or lung disease.  You have a weakened defense (immune) system.  You are very young or very old.  You have nasal allergies or asthma.  You work in crowded or poorly ventilated areas.  You work in health care facilities or schools.  What are the signs or symptoms? Symptoms typically develop 2-3 days after you come in contact with a cold virus. Most viral URIs last 7-10 days. However, viral URIs from the influenza virus (flu virus) can last 14-18 days and are typically more severe. Symptoms may include:  Runny or stuffy (congested) nose.  Sneezing.  Cough.  Sore throat.  Headache.  Fatigue.  Fever.  Loss of appetite.  Pain in your forehead, behind your eyes, and over your cheekbones (sinus pain).  Muscle aches.  How is this diagnosed? Your health care provider may diagnose a URI by:  Physical exam.  Tests to check that your  symptoms are not due to another condition such as: ? Strep throat. ? Sinusitis. ? Pneumonia. ? Asthma.  How is this treated? A URI goes away on its own with time. It cannot be cured with medicines, but medicines may be prescribed or recommended to relieve symptoms. Medicines may help:  Reduce your fever.  Reduce your cough.  Relieve nasal congestion.  Follow these instructions at home:  Take medicines only as directed by your health care provider.  Gargle warm saltwater or take cough drops to comfort your throat as directed by your health care provider.  Use a warm mist humidifier or inhale steam from a shower to increase air moisture. This may make it easier to breathe.  Drink enough fluid to keep your urine clear or pale yellow.  Eat soups and other clear broths and maintain good nutrition.  Rest as needed.  Return to work when your temperature has returned to normal or as your health care provider advises. You may need to stay home longer to avoid infecting others. You can also use a face mask and careful hand washing to prevent spread of the virus.  Increase the usage of your inhaler if you have asthma.  Do not use any tobacco products, including cigarettes, chewing tobacco, or electronic cigarettes. If you need help quitting, ask your health care provider. How is this prevented? The best way to protect yourself from getting a cold is to practice good hygiene.  Avoid oral or hand contact with people with cold symptoms.  Wash your   hands often if contact occurs.  There is no clear evidence that vitamin C, vitamin E, echinacea, or exercise reduces the chance of developing a cold. However, it is always recommended to get plenty of rest, exercise, and practice good nutrition. Contact a health care provider if:  You are getting worse rather than better.  Your symptoms are not controlled by medicine.  You have chills.  You have worsening shortness of breath.  You have  brown or red mucus.  You have yellow or brown nasal discharge.  You have pain in your face, especially when you bend forward.  You have a fever.  You have swollen neck glands.  You have pain while swallowing.  You have white areas in the back of your throat. Get help right away if:  You have severe or persistent: ? Headache. ? Ear pain. ? Sinus pain. ? Chest pain.  You have chronic lung disease and any of the following: ? Wheezing. ? Prolonged cough. ? Coughing up blood. ? A change in your usual mucus.  You have a stiff neck.  You have changes in your: ? Vision. ? Hearing. ? Thinking. ? Mood. This information is not intended to replace advice given to you by your health care provider. Make sure you discuss any questions you have with your health care provider. Document Released: 10/24/2000 Document Revised: 01/01/2016 Document Reviewed: 08/05/2013 Elsevier Interactive Patient Education  2018 Elsevier Inc.  

## 2017-07-23 NOTE — Progress Notes (Signed)
Subjective:     [name], female   DOB: [birth year], 72 y.o.   MRN: [id number]  HPI Patient here to discuss the following issues  Acute issue of URI symptoms which started yesterday. She has some sore throat and mild body aches and some congestion. She has some dry cough. No fever. No nausea or vomiting. She has mild hoarseness.  She is here to discuss her husband's cognitive decline. He came for Medicare wellness visit recently and had MMSE score 13 which was a huge drop-off from 28 which he had previously- though this had been some time ago. He does have history of B12 deficiency is on regular replacement. No recent TSH. He still drives short distances but she is becoming more concerned about that.  No firearms in the house. He does not do any significant amount of cooking.  For the most part he has fairly well-controlled behavior but does get frustrated at times with his memory deficits. No violent outbursts.  Past Medical History:  Diagnosis Date  . Abdominal pain, epigastric 08/22/2009  . Adenomatous colon polyp   . Chronic low back pain   . Diverticulosis   . Duodenal ulcer   . GERD (gastroesophageal reflux disease)   . Hemorrhoids   . Hiatal hernia   . HYPOTHYROIDISM 07/20/2008  . Insomnia   . NSVD (normal spontaneous vaginal delivery)    X2   Past Surgical History:  Procedure Laterality Date  . ABDOMINAL HYSTERECTOMY     TVH  . BACK SURGERY     RUPTURED DISC  . BLEPHAROPLASTY    . CHOLECYSTECTOMY    . COLONOSCOPY  JAN 2014   NEXT COLONOSCOPY IN 10 YEARS  . DILATION AND CURETTAGE OF UTERUS    . EYE SURGERY  2008   BILATERAL CATARACT     reports that she quit smoking about 13 years ago. Her smoking use included cigarettes. She has a 60.00 pack-year smoking history. she has never used smokeless tobacco. She reports that she does not drink alcohol or use drugs. family history includes Cancer in her father and mother; Diabetes in her brother, mother, and  sister; Heart disease in her brother, daughter, and father; Hypertension in her brother and sister. Allergies  Allergen Reactions  . Aspirin     REACTION: Hives     Review of Systems  Constitutional: Positive for chills and fatigue.  HENT: Positive for congestion and sore throat. Negative for trouble swallowing.   Respiratory: Positive for cough.        Objective:   Physical Exam  Constitutional: She appears well-developed and well-nourished.  HENT:  Right Ear: External ear normal.  Left Ear: External ear normal.  Mouth/Throat: Oropharynx is clear and moist.  Eyes: Pupils are equal, round, and reactive to light.  Neck: Neck supple. No JVD present. No thyromegaly present.  Cardiovascular: Normal rate and regular rhythm. Exam reveals no gallop.  Pulmonary/Chest: Effort normal and breath sounds normal. No respiratory distress. She has no wheezes. She has no rales.  Musculoskeletal: She exhibits no edema.  Neurological: She is alert.       Assessment:     #1 viral URI with cough  #2 patient here to discuss husband's cognitive decline and dementia    Plan:     -Treat cold symptoms symptomatically. Plenty fluids and rest. Advil or Aleve as needed for body aches  -We have given wife some resources regarding Alzheimer's disease. She'll bring him back in for 30 minute follow-up  and recheck B12 and TSH levels. We discussed potential medication options but with score of 13/30 doubt Aricept, Exelon, or Namenda would have much impact at this stage.  -We talked in some detail about safety issues. Major issue is driving. He has driving assessment/ renewal of license coming up already. We recommend restricting driving as much as possible. -over 25 minutes spent with pt assessing and discussing her URI symptoms, safety issues with spouse with dementia, potential medication options, progression of disease.   Eulas Post MD [facility] at [facility]

## 2017-07-26 DIAGNOSIS — H04123 Dry eye syndrome of bilateral lacrimal glands: Secondary | ICD-10-CM | POA: Diagnosis not present

## 2017-07-26 DIAGNOSIS — H18892 Other specified disorders of cornea, left eye: Secondary | ICD-10-CM | POA: Diagnosis not present

## 2017-08-08 ENCOUNTER — Ambulatory Visit (INDEPENDENT_AMBULATORY_CARE_PROVIDER_SITE_OTHER): Payer: Medicare Other

## 2017-08-08 DIAGNOSIS — E538 Deficiency of other specified B group vitamins: Secondary | ICD-10-CM | POA: Diagnosis not present

## 2017-08-08 MED ORDER — CYANOCOBALAMIN 1000 MCG/ML IJ SOLN
1000.0000 ug | Freq: Once | INTRAMUSCULAR | Status: AC
  Administered 2017-08-08: 1000 ug via INTRAMUSCULAR

## 2017-08-08 NOTE — Progress Notes (Signed)
Per orders of Dr. Elease Hashimoto, injection of given by Wyvonne Lenz. Patient tolerated injection well.

## 2017-08-12 DIAGNOSIS — H353131 Nonexudative age-related macular degeneration, bilateral, early dry stage: Secondary | ICD-10-CM | POA: Diagnosis not present

## 2017-08-12 DIAGNOSIS — H04123 Dry eye syndrome of bilateral lacrimal glands: Secondary | ICD-10-CM | POA: Diagnosis not present

## 2017-08-12 DIAGNOSIS — Z961 Presence of intraocular lens: Secondary | ICD-10-CM | POA: Diagnosis not present

## 2017-08-12 DIAGNOSIS — H35033 Hypertensive retinopathy, bilateral: Secondary | ICD-10-CM | POA: Diagnosis not present

## 2017-08-16 ENCOUNTER — Telehealth: Payer: Self-pay | Admitting: Internal Medicine

## 2017-08-19 DIAGNOSIS — Z1231 Encounter for screening mammogram for malignant neoplasm of breast: Secondary | ICD-10-CM | POA: Diagnosis not present

## 2017-08-21 MED ORDER — SUCRALFATE 1 G PO TABS: 1.0000 g | ORAL_TABLET | Freq: Three times a day (TID) | ORAL | 1 refills | Status: DC

## 2017-08-21 NOTE — Telephone Encounter (Signed)
Refilled Carafate 

## 2017-08-28 ENCOUNTER — Ambulatory Visit (INDEPENDENT_AMBULATORY_CARE_PROVIDER_SITE_OTHER): Payer: Medicare Other | Admitting: Family Medicine

## 2017-08-28 ENCOUNTER — Encounter: Payer: Self-pay | Admitting: Family Medicine

## 2017-08-28 VITALS — BP 110/70 | HR 76 | Temp 98.2°F | Wt 142.4 lb

## 2017-08-28 DIAGNOSIS — L237 Allergic contact dermatitis due to plants, except food: Secondary | ICD-10-CM | POA: Diagnosis not present

## 2017-08-28 MED ORDER — PREDNISONE 10 MG PO TABS: ORAL_TABLET | ORAL | 0 refills | Status: DC

## 2017-08-28 NOTE — Progress Notes (Signed)
Subjective:     [name], female   DOB: 1946-01-12, 72 y.o.   MRN: [id number]  HPI Patient seen with pruritic rash with onset last Friday. She does fair amount of time outside working in the yard. Never had problems with contact dermatitis in the past. Couple of friends had suggested this was probably what it was. She's not had any fevers or chills. Rash is pruritic. Nonpainful. No relief with over-the-counter topicals  Past Medical History:  Diagnosis Date  . Abdominal pain, epigastric 08/22/2009  . Adenomatous colon polyp   . Chronic low back pain   . Diverticulosis   . Duodenal ulcer   . GERD (gastroesophageal reflux disease)   . Hemorrhoids   . Hiatal hernia   . HYPOTHYROIDISM 07/20/2008  . Insomnia   . NSVD (normal spontaneous vaginal delivery)    X2   Past Surgical History:  Procedure Laterality Date  . ABDOMINAL HYSTERECTOMY     TVH  . BACK SURGERY     RUPTURED DISC  . BLEPHAROPLASTY    . CHOLECYSTECTOMY    . COLONOSCOPY  JAN 2014   NEXT COLONOSCOPY IN 10 YEARS  . DILATION AND CURETTAGE OF UTERUS    . EYE SURGERY  2008   BILATERAL CATARACT     reports that she quit smoking about 14 years ago. Her smoking use included cigarettes. She has a 60.00 pack-year smoking history. She has never used smokeless tobacco. She reports that she does not drink alcohol or use drugs. family history includes Cancer in her father and mother; Diabetes in her brother, mother, and sister; Heart disease in her brother, daughter, and father; Hypertension in her brother and sister. Allergies  Allergen Reactions  . Aspirin     REACTION: Hives     Review of Systems  Constitutional: Negative for chills and fever.  Skin: Positive for rash.       Objective:   Physical Exam  Constitutional: She appears well-developed and well-nourished.  Cardiovascular: Normal rate and regular rhythm.  Pulmonary/Chest: Effort normal and breath sounds normal. No respiratory distress. She has  no wheezes. She has no rales.  Skin: Rash noted.  Patient has scattered rash on her forearms and lower extremities. Erythematous base and vesicular surface some in linear distribution       Assessment:     Contact dermatitis    Plan:     -Prednisone taper over 11 days. Reviewed potential side effects of prednisone -Touch base if rash not resolving or improving over the next several days  Eulas Post MD [facility]

## 2017-08-28 NOTE — Patient Instructions (Signed)

## 2017-09-05 ENCOUNTER — Other Ambulatory Visit (INDEPENDENT_AMBULATORY_CARE_PROVIDER_SITE_OTHER): Payer: Medicare Other

## 2017-09-05 DIAGNOSIS — E538 Deficiency of other specified B group vitamins: Secondary | ICD-10-CM | POA: Diagnosis not present

## 2017-09-05 LAB — VITAMIN B12: VITAMIN B 12: 287 pg/mL (ref 211–911)

## 2017-09-27 ENCOUNTER — Encounter: Payer: Medicare Other | Admitting: Obstetrics & Gynecology

## 2017-10-04 ENCOUNTER — Other Ambulatory Visit: Payer: Self-pay | Admitting: Internal Medicine

## 2017-10-22 ENCOUNTER — Telehealth: Payer: Self-pay | Admitting: *Deleted

## 2017-10-22 NOTE — Telephone Encounter (Signed)
Patient called requesting refill on estradiol 0.02% cream last filled in 2016, c/o a spot in vagina that burns off/on.  Has annual scheduled in July with Dr.Lavoie, I suggested since Dr.Fernandez did not refill this in 2018 nor discuss this and last refill being in 2016 she make a visit with Dr.Lavoie and kept annual appointment. Pt states she may do so if becomes worse.

## 2017-10-24 ENCOUNTER — Encounter: Payer: Medicare Other | Admitting: Obstetrics & Gynecology

## 2017-10-26 ENCOUNTER — Other Ambulatory Visit: Payer: Self-pay | Admitting: Family Medicine

## 2017-10-28 ENCOUNTER — Other Ambulatory Visit: Payer: Self-pay

## 2017-10-30 ENCOUNTER — Other Ambulatory Visit: Payer: Self-pay | Admitting: *Deleted

## 2017-10-30 DIAGNOSIS — H903 Sensorineural hearing loss, bilateral: Secondary | ICD-10-CM | POA: Diagnosis not present

## 2017-10-30 DIAGNOSIS — H9190 Unspecified hearing loss, unspecified ear: Secondary | ICD-10-CM | POA: Diagnosis not present

## 2017-10-30 MED ORDER — LEVOTHYROXINE SODIUM 88 MCG PO TABS: ORAL_TABLET | ORAL | 0 refills | Status: DC

## 2017-11-06 ENCOUNTER — Encounter: Payer: Self-pay | Admitting: Family Medicine

## 2017-11-06 ENCOUNTER — Ambulatory Visit (INDEPENDENT_AMBULATORY_CARE_PROVIDER_SITE_OTHER): Payer: Medicare Other | Admitting: Family Medicine

## 2017-11-06 ENCOUNTER — Ambulatory Visit (INDEPENDENT_AMBULATORY_CARE_PROVIDER_SITE_OTHER): Payer: Medicare Other

## 2017-11-06 VITALS — BP 132/64 | HR 67 | Temp 97.8°F | Wt 141.5 lb

## 2017-11-06 DIAGNOSIS — R042 Hemoptysis: Secondary | ICD-10-CM

## 2017-11-06 DIAGNOSIS — J929 Pleural plaque without asbestos: Secondary | ICD-10-CM | POA: Diagnosis not present

## 2017-11-06 DIAGNOSIS — K219 Gastro-esophageal reflux disease without esophagitis: Secondary | ICD-10-CM | POA: Diagnosis not present

## 2017-11-06 NOTE — Progress Notes (Signed)
  Subjective:     [name], female   DOB: [birth year], 72 y.o.   MRN: [id number]  HPI Patient is here with initially complaints of 4 month history of cough with some hemoptysis. On further questioning though she really denies cough and states that this is more like clearing her throat. She states she's done this daily for several weeks. Denies any appetite or weight changes. No fever. No dyspnea. No hoarseness. No nosebleeds. Appetite is good.  No dysphagia.    Patient denies any chronic sinusitis symptoms. No sore throat. No postnasal drip. Patient quit smoking 2005 after about 60 pack year history. No other bleeding or bruising  Past Medical History:  Diagnosis Date  . Abdominal pain, epigastric 08/22/2009  . Adenomatous colon polyp   . Chronic low back pain   . Diverticulosis   . Duodenal ulcer   . GERD (gastroesophageal reflux disease)   . Hemorrhoids   . Hiatal hernia   . HYPOTHYROIDISM 07/20/2008  . Insomnia   . NSVD (normal spontaneous vaginal delivery)    X2   Past Surgical History:  Procedure Laterality Date  . ABDOMINAL HYSTERECTOMY     TVH  . BACK SURGERY     RUPTURED DISC  . BLEPHAROPLASTY    . CHOLECYSTECTOMY    . COLONOSCOPY  JAN 2014   NEXT COLONOSCOPY IN 10 YEARS  . DILATION AND CURETTAGE OF UTERUS    . EYE SURGERY  2008   BILATERAL CATARACT     reports that she quit smoking about 14 years ago. Her smoking use included cigarettes. She has a 60.00 pack-year smoking history. She has never used smokeless tobacco. She reports that she does not drink alcohol or use drugs. family history includes Cancer in her father and mother; Diabetes in her brother, mother, and sister; Heart disease in her brother, daughter, and father; Hypertension in her brother and sister. Allergies  Allergen Reactions  . Aspirin     REACTION: Hives     Review of Systems  Constitutional: Negative for appetite change, chills, fever and unexpected weight change.  HENT:  Negative for nosebleeds, sore throat and trouble swallowing.   Respiratory: Negative for chest tightness, shortness of breath and wheezing.   Cardiovascular: Negative for chest pain, palpitations and leg swelling.  Gastrointestinal: Negative for abdominal pain.       Objective:   Physical Exam  Constitutional: She appears well-developed and well-nourished.  HENT:  Mouth/Throat: Oropharynx is clear and moist.  Neck: Neck supple.  Cardiovascular: Normal rate and regular rhythm.  Pulmonary/Chest: Effort normal and breath sounds normal. No stridor. No respiratory distress. She has no wheezes. She has no rales.  Abdominal: Soft. There is no tenderness.  Lymphadenopathy:    She has no cervical adenopathy.       Assessment:     Patient presents with 4 month history of spitting up some blood. She had viral URI back in March. She states this is really less coughing and more "clearing her throat". She does have history of GERD and is on Prilosec daily. Denies any dysphagia or pain with swallowing. She does not any red flags such as appetite or weight changes. She does have a long history of smoking.    Plan:     -Start with PA and lateral chest x-ray -If normal consider possible GI referral for further evaluation.  Eulas Post MD [facility] at [facility]

## 2017-11-06 NOTE — Patient Instructions (Signed)
Go for CXR as discussed.

## 2017-11-18 ENCOUNTER — Encounter: Payer: Self-pay | Admitting: Internal Medicine

## 2017-11-18 ENCOUNTER — Ambulatory Visit (INDEPENDENT_AMBULATORY_CARE_PROVIDER_SITE_OTHER): Payer: Medicare Other | Admitting: Internal Medicine

## 2017-11-18 VITALS — BP 130/72 | HR 76 | Ht 63.0 in | Wt 144.1 lb

## 2017-11-18 DIAGNOSIS — R6889 Other general symptoms and signs: Secondary | ICD-10-CM | POA: Diagnosis not present

## 2017-11-18 DIAGNOSIS — K279 Peptic ulcer, site unspecified, unspecified as acute or chronic, without hemorrhage or perforation: Secondary | ICD-10-CM | POA: Diagnosis not present

## 2017-11-18 DIAGNOSIS — K219 Gastro-esophageal reflux disease without esophagitis: Secondary | ICD-10-CM

## 2017-11-18 DIAGNOSIS — R0989 Other specified symptoms and signs involving the circulatory and respiratory systems: Secondary | ICD-10-CM

## 2017-11-18 NOTE — Progress Notes (Signed)
HISTORY OF PRESENT ILLNESS:  Cassidy Norton is a 73 y.o. female , former smoker, who has been seen in this office for Cassidy Norton. Last evaluated July 2018. He was told to resume omeprazole 20 mg twice daily for active reflux symptoms and a history of NSAID related duodenal ulcer. She continues on that medicine. She is sent today by her primary care provider regarding recent problems with throat clearing with bloody material. Patient reports a 3 or 4 month history of tightness in the throat region with associated recurrent throat clearing behavior. She will notice blood in the throat clearing material once or twice weekly. She denies active reflux symptoms. No dysphagia. No hemoptysis. Recent chest x-ray (reviewed) was negative for active disease. Review of outside blood work from April reveals normal CBC with hemoglobin 13.8. GI review of systems is entirely negative. Last colonoscopy 2014 was negative for neoplasia. She denies weight loss. She has not smoked in years  REVIEW OF SYSTEMS:  All non-GI ROS negative except for back pain, hearing problems(seeing Dr. Wilburn Norton), throat clearing with blood, fevers (last week)  Past Medical History:  Diagnosis Date  . Abdominal pain, epigastric 08/22/2009  . Adenomatous colon polyp   . Chronic low back pain   . Diverticulosis   . Duodenal ulcer   . GERD (gastroesophageal reflux disease)   . Hemorrhoids   . Hiatal hernia   . HYPOTHYROIDISM 07/20/2008  . Insomnia   . NSVD (normal spontaneous vaginal delivery)    X2    Past Surgical History:  Procedure Laterality Date  . ABDOMINAL HYSTERECTOMY     TVH  . BACK SURGERY     RUPTURED DISC  . BLEPHAROPLASTY    . CHOLECYSTECTOMY    . COLONOSCOPY  JAN 2014   NEXT COLONOSCOPY IN 10 YEARS  . DILATION AND CURETTAGE OF UTERUS    . EYE SURGERY  2008   BILATERAL CATARACT     Social History Cassidy Norton  reports that she quit smoking about 14 years ago. Her smoking use  included cigarettes. She has a 60.00 pack-year smoking history. She has never used smokeless tobacco. She reports that she does not drink alcohol or use drugs.  family history includes Cancer in her father and mother; Diabetes in her brother, mother, and sister; Heart disease in her brother, daughter, and father; Hypertension in her brother and sister.  Allergies  Allergen Reactions  . Aspirin     REACTION: Hives       PHYSICAL EXAMINATION: Vital signs: BP 130/72   Pulse 76   Ht 5\' 3"  (1.6 m)   Wt 144 lb 2 oz (65.4 kg)   BMI 25.53 kg/m   Constitutional: generally well-appearing, no acute distress Psychiatric: alert and oriented x3, cooperative Eyes: extraocular movements intact, anicteric, conjunctiva pink Mouth: oral pharynx moist, no lesions. I could not clear the most posterior pharynx with standard tongue depressor and light, no obvious abnormalities in examined areas Neck: supple no lymphadenopathy Cardiovascular: heart regular rate and rhythm, no murmur Lungs: clear to auscultation bilaterally Abdomen: soft, nontender, nondistended, no obvious ascites, no peritoneal signs, normal bowel sounds, no organomegaly Rectal: omitted Extremities: no clubbing, cyanosis, or lower extremity edema bilaterally Skin: no lesions on visible extremities Neuro: No focal deficits. Cranial nerves intact   ASSESSMENT:  #1. Throat clearing with minor bleeding. This is not a primary gastrointestinal disorder. As she has a history of smoking, she needs formal ENT evaluation #2. GERD. Asymptomatic on PPI #  3. History of NSAID-induced ulcers #4. History of adenomatous colon Norton. Surveillance up-to-date   PLAN:  #1. Return to Dr. Wilburn Norton to evaluate pharyngeal complaints and bleeding. We have contacted his office in this regard. She is due to see them next week regarding hearing aids #2. Reflux precautions #3. Continue PPI #4. Routine surveillance colonoscopy 2024 #5. Resume general  medical care with Dr. Elease Norton

## 2017-11-18 NOTE — Patient Instructions (Signed)
You have been scheduled for an appointment with Dr. Wilburn Cornelia to further discuss your throat clearing on 12/10/2017 at 3:50pm.  Please arrive at 3:30pm.

## 2017-11-21 ENCOUNTER — Ambulatory Visit (INDEPENDENT_AMBULATORY_CARE_PROVIDER_SITE_OTHER): Payer: Medicare Other | Admitting: Obstetrics & Gynecology

## 2017-11-21 ENCOUNTER — Encounter: Payer: Self-pay | Admitting: Obstetrics & Gynecology

## 2017-11-21 VITALS — Ht 64.5 in | Wt 144.0 lb

## 2017-11-21 DIAGNOSIS — N9089 Other specified noninflammatory disorders of vulva and perineum: Secondary | ICD-10-CM | POA: Diagnosis not present

## 2017-11-21 DIAGNOSIS — Z90710 Acquired absence of both cervix and uterus: Secondary | ICD-10-CM | POA: Diagnosis not present

## 2017-11-21 DIAGNOSIS — Z780 Asymptomatic menopausal state: Secondary | ICD-10-CM

## 2017-11-21 DIAGNOSIS — M85852 Other specified disorders of bone density and structure, left thigh: Secondary | ICD-10-CM

## 2017-11-21 DIAGNOSIS — M8588 Other specified disorders of bone density and structure, other site: Secondary | ICD-10-CM

## 2017-11-21 DIAGNOSIS — Z01419 Encounter for gynecological examination (general) (routine) without abnormal findings: Secondary | ICD-10-CM

## 2017-11-21 NOTE — Progress Notes (Signed)
Cassidy Norton Feb 06, 1946 [id number]   History:    72 y.o. G3P2A1L2 Married.  Husband has early Alzheimer  RP:  Established patient presenting for annual gyn exam   HPI: S/P Total Hysterectomy.  Menopause with persistent hot flushes/night sweats when tried to stop HRT.  Still on Estradiol 0.5 mg tab every 2 days.  No vasomotor symptoms at all on that dosage since last year.  No pelvic pain.  No pain with IC.  Occasional vulvar irritation/burning with urine contact, helped with Triamcinolone cream 1%.  Urine/BMs wnl.  Breasts wnl.  Health Labs with Fam MD.  Seen by [facility] last week, [facility] 2016.  Past medical history,surgical history, family history and social history were all reviewed and documented in the EPIC chart.  Gynecologic History No LMP recorded. Patient has had a hysterectomy. Contraception: status post hysterectomy Last Pap: 2012. Results were: Negative Last mammogram: 08/2017. Results were: normal per patient, will obtain from [facility]: 09/2014 Left femoral neck T-Score -1.2 Colonoscopy: 2016  Obstetric History OB History  Gravida Para Term Preterm AB Living  3 2 2   1 2   SAB TAB Ectopic Multiple Live Births  1       2    # Outcome Date GA Lbr Len/2nd Weight Sex Delivery Anes PTL Lv  3 SAB           2 Term     F Vag-Spont  N LIV  1 Term     M Vag-Spont  N LIV     Birth Comments: has died in a car accident at 29 yrs     ROS: A ROS was performed and pertinent positives and negatives are included in the history.  GENERAL: No fevers or chills. HEENT: No change in vision, no earache, sore throat or sinus congestion. NECK: No pain or stiffness. CARDIOVASCULAR: No chest pain or pressure. No palpitations. PULMONARY: No shortness of breath, cough or wheeze. GASTROINTESTINAL: No abdominal pain, nausea, vomiting or diarrhea, melena or bright red blood per rectum. GENITOURINARY: No urinary frequency, urgency, hesitancy or dysuria. MUSCULOSKELETAL: No joint or muscle pain,  no back pain, no recent trauma. DERMATOLOGIC: No rash, no itching, no lesions. ENDOCRINE: No polyuria, polydipsia, no heat or cold intolerance. No recent change in weight. HEMATOLOGICAL: No anemia or easy bruising or bleeding. NEUROLOGIC: No headache, seizures, numbness, tingling or weakness. PSYCHIATRIC: No depression, no loss of interest in normal activity or change in sleep pattern.     Exam:   Ht 5' 4.5" (1.638 m)   Wt 144 lb (65.3 kg)   BMI 24.34 kg/m   Body mass index is 24.34 kg/m.  General appearance : Well developed well nourished female. No acute distress HEENT: Eyes: no retinal hemorrhage or exudates,  Neck supple, trachea midline, no carotid bruits, no thyroidmegaly Lungs: Clear to auscultation, no rhonchi or wheezes, or rib retractions  Heart: Regular rate and rhythm, no murmurs or gallops Breast:Examined in sitting and supine position were symmetrical in appearance, no palpable masses or tenderness,  no skin retraction, no nipple inversion, no nipple discharge, no skin discoloration, no axillary or supraclavicular lymphadenopathy Abdomen: no palpable masses or tenderness, no rebound or guarding Extremities: no edema or skin discoloration or tenderness  Pelvic: Vulva: Atrophy of menopause, no discrete lesion             Vagina: No gross lesions or discharge  Cervix/Uterus absent  Adnexa  Without masses or tenderness  Anus: Normal   Assessment/Plan:  72 y.o. female for annual exam   1. Well female exam with routine gynecological exam Gynecologic exam status post total hysterectomy and menopause.  History of normal Pap test, last Pap test negative in 2012, no indication to repeat at this time.  Breast exam normal.  Will obtain screening mammogram done at [facility] April 2019 per patient.  Fasting health labs with family physician.  Colonoscopy done in 2016 per patient.  2. Hx of total hysterectomy  3. Menopause present On hormone replacement therapy for many years with  difficulty stopping due to recurrence of severe vasomotor symptoms currently on estradiol.  0.5 mg/tab. every 2 days.  On that low dosage has not experienced hot flashes or night sweats for many months.  Agrees to wean and stop hormone replacement therapy.  Understands that at this point the risks of hormone replacement therapy outweigh the benefits.  4. Vulvar irritation Has been using triamcinolone cream 1% as needed with good results.  Vulvar irritation may be due to mild urine leakage.  Recommend trying a  thin application of Vaseline to protect the vulva.  Patient will call if needs a re-prescription of the triamcinolone cream 1%.  5. Osteopenia of neck of left femur Vitamin D supplements, calcium rich nutrition and weightbearing physical activity recommended.  follow up here for bone density. - DG Bone Density; Future  Counseling on above issues and coordination of care more than 50% for 15 minutes.  Princess Bruins MD, 9:44 AM 11/21/2017

## 2017-11-21 NOTE — Patient Instructions (Signed)
1. Well female exam with routine gynecological exam Gynecologic exam status post total hysterectomy and menopause.  History of normal Pap test, last Pap test negative in 2012, no indication to repeat at this time.  Breast exam normal.  Will obtain screening mammogram done at [facility] April 2019 per patient.  Fasting health labs with family physician.  Colonoscopy done in 2016 per patient.  2. Hx of total hysterectomy  3. Menopause present On hormone replacement therapy for many years with difficulty stopping due to recurrence of severe vasomotor symptoms currently on estradiol.  0.5 mg/tab. every 2 days.  On that low dosage has not experienced hot flashes or night sweats for many months.  Agrees to wean and stop hormone replacement therapy.  Understands that at this point the risks of hormone replacement therapy outweigh the benefits.  4. Vulvar irritation Has been using triamcinolone cream 1% as needed with good results.  Vulvar irritation may be due to mild urine leakage.  Recommend trying a  thin application of Vaseline to protect the vulva.  Patient will call if needs a re-prescription of the triamcinolone cream 1%.  5. Osteopenia of neck of left femur Vitamin D supplements, calcium rich nutrition and weightbearing physical activity recommended.  follow up here for bone density. - DG Bone Density; Future  Lauralee, it was a pleasure meeting you today!

## 2017-11-22 ENCOUNTER — Other Ambulatory Visit: Payer: Self-pay | Admitting: Internal Medicine

## 2017-11-22 ENCOUNTER — Encounter: Payer: Medicare Other | Admitting: Obstetrics & Gynecology

## 2017-11-27 ENCOUNTER — Telehealth: Payer: Self-pay

## 2017-11-27 NOTE — Telephone Encounter (Signed)
Faxed referral documents to [facility] for 12/10/2017 appointment

## 2017-11-28 ENCOUNTER — Encounter: Payer: Self-pay | Admitting: Anesthesiology

## 2017-12-11 DIAGNOSIS — Z8719 Personal history of other diseases of the digestive system: Secondary | ICD-10-CM | POA: Diagnosis not present

## 2017-12-11 DIAGNOSIS — K219 Gastro-esophageal reflux disease without esophagitis: Secondary | ICD-10-CM | POA: Diagnosis not present

## 2017-12-11 DIAGNOSIS — Z87891 Personal history of nicotine dependence: Secondary | ICD-10-CM | POA: Diagnosis not present

## 2017-12-13 ENCOUNTER — Other Ambulatory Visit: Payer: Self-pay

## 2017-12-15 ENCOUNTER — Other Ambulatory Visit: Payer: Self-pay | Admitting: Internal Medicine

## 2017-12-24 IMAGING — MR MR HEAD WO/W CM
14 series · 48 of 48 positions shown · IV contrast (15ml Multihance)
Comparison: CT head without contrast 01/08/2011

CLINICAL DATA: Intermittent left-sided face and ear pain for 3
months. Left-sided hearing loss.

Creatinine was obtained on site at [HOSPITAL] at [HOSPITAL].
Results: Creatinine 0.7 mg/dL.
EXAM:
MRI HEAD WITHOUT AND WITH CONTRAST
TECHNIQUE: Multiplanar, multiecho pulse sequences of the brain and surrounding
structures were obtained without and with intravenous contrast.
CONTRAST:  15mL MULTIHANCE GADOBENATE DIMEGLUMINE 529 MG/ML IV SOLN

[Series 2: t1_se_sag · sagittal · 5.0mm · 0.45mm/px · 1 of 25 slices shown]
[im 1/25]
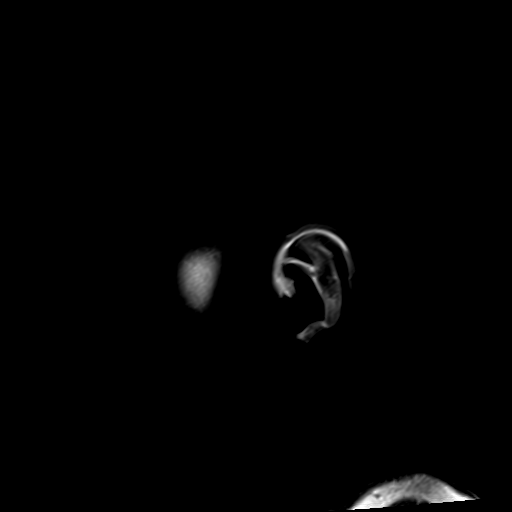

[Series 3: ep2d_diff_(id)_trace · axial · 3.0mm · 1.80mm/px · z∈[-58,+83]mm · 4 of 95 slices shown]
[im 1/95]
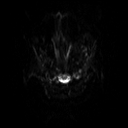
[im 32/95]
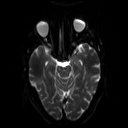
[im 63/95]
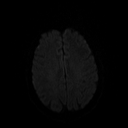
[im 95/95]
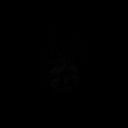

[Series 4: ep2d_diff_(id)_trace_adc · axial · 3.0mm · 1.80mm/px · z∈[-67,+83]mm · 3 of 48 slices shown]
[im 1/48]
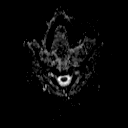
[im 24/48]
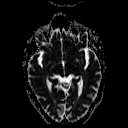
[im 48/48]
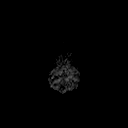

[Series 5: ep2d_diff_cor · coronal · 5.0mm · 1.77mm/px · 3 of 60 slices shown]
[im 1/60]
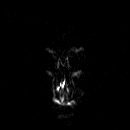
[im 30/60]
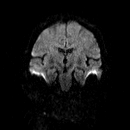
[im 60/60]
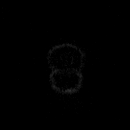

[Series 6: ep2d_diff_cor_adc · coronal · 5.0mm · 1.77mm/px · 2 of 30 slices shown]
[im 1/30]
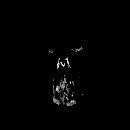
[im 30/30]
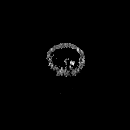

[Series 7: mip_images(sw) · axial · 16.0mm · 0.90mm/px · z∈[-63,+81]mm · 4 of 73 slices shown]
[im 1/73]
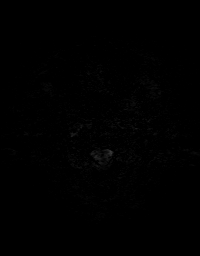
[im 25/73]
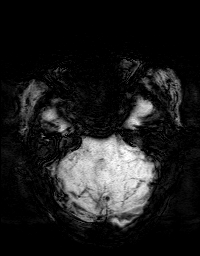
[im 49/73]
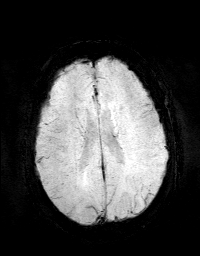
[im 73/73]
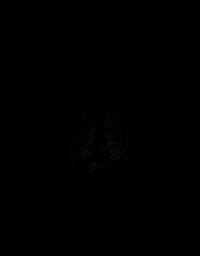

[Series 8: swi_images · axial · 2.0mm · 0.90mm/px · z∈[-70,+88]mm · 4 of 80 slices shown]
[im 1/80]
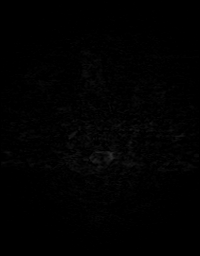
[im 27/80]
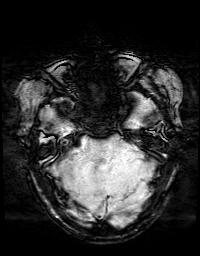
[im 53/80]
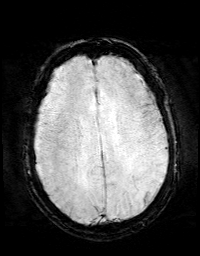
[im 80/80]
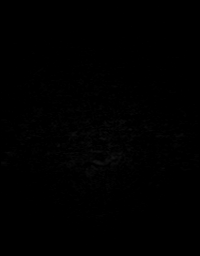

[Series 9: FLAIR · axial · 3.0mm · 0.45mm/px · 1 of 27 slices shown]
[im 1/27]
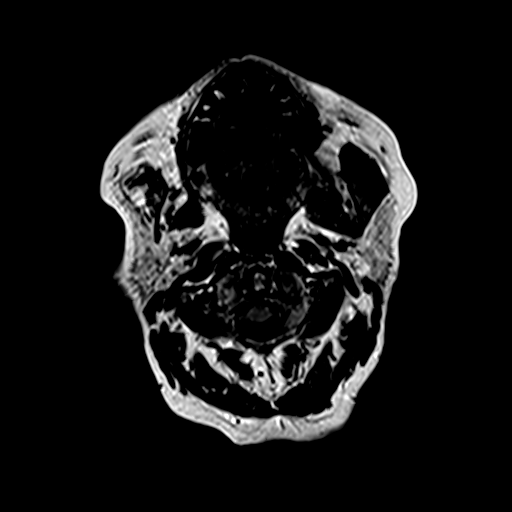

[Series 10: t2_tse_tra_512 · axial · 5.0mm · 0.60mm/px · 1 of 25 slices shown]
[im 1/25]
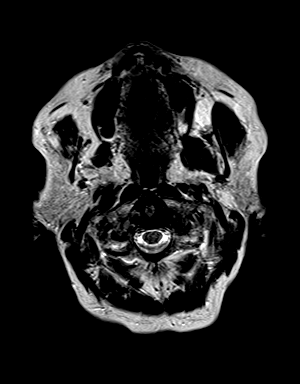

[Series 11: t1_mpr_tra · axial · 1.0mm · 0.72mm/px · z∈[-70,+89]mm · 9 of 160 slices shown]
[im 1/160]
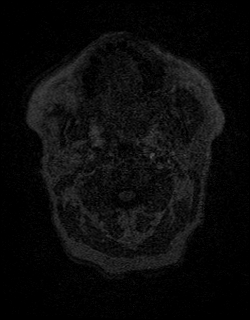
[im 20/160]
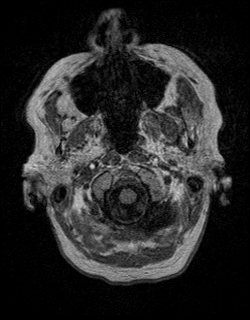
[im 40/160]
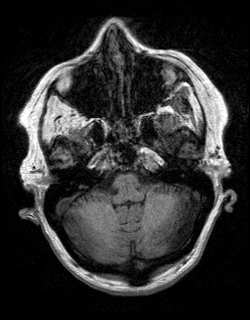
[im 60/160]
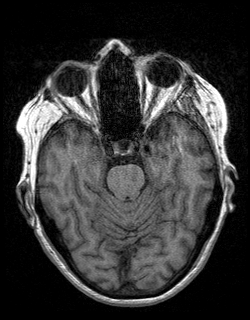
[im 80/160]
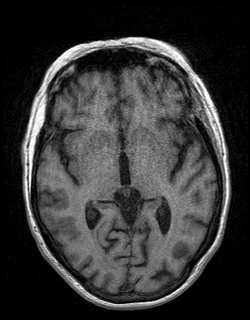
[im 100/160]
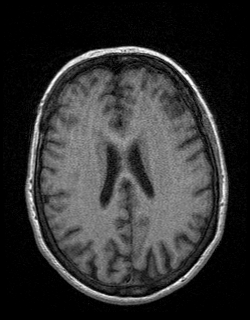
[im 120/160]
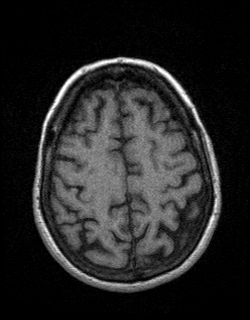
[im 140/160]
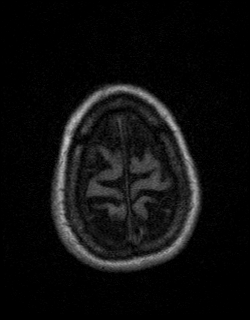
[im 160/160]
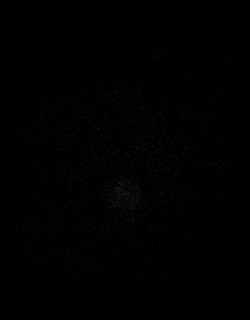

[Series 13: bSSFP · axial · 0.7mm · 0.28mm/px · z∈[-49,-10]mm · 3 of 56 slices shown]
[im 1/56]
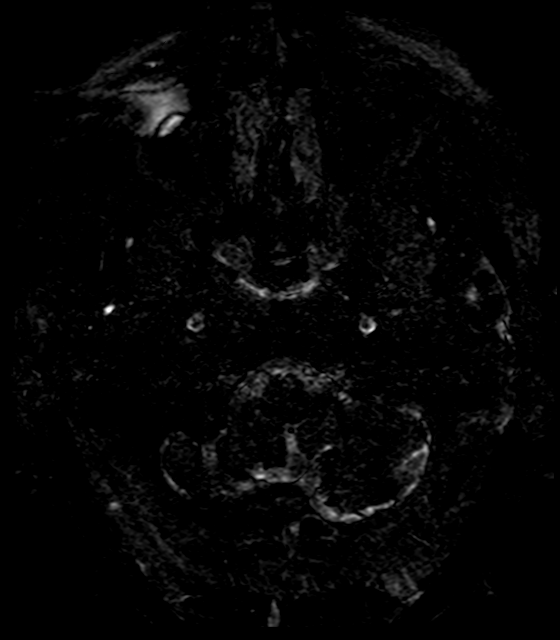
[im 28/56]
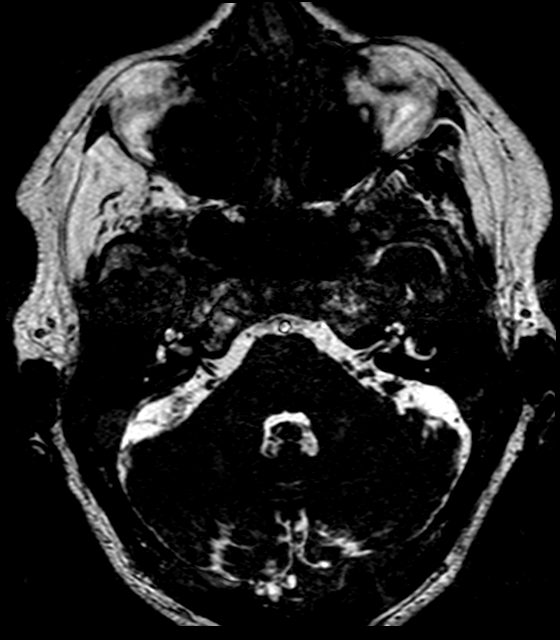
[im 56/56]
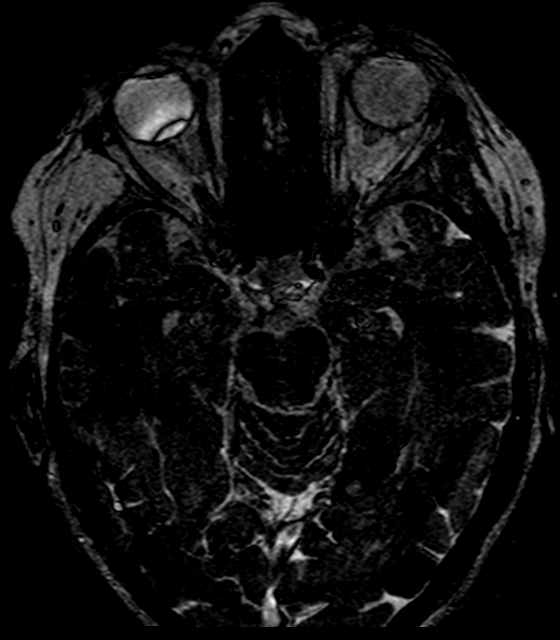

[Series 14: T2 · coronal · 5.0mm · 0.45mm/px · 2 of 30 slices shown]
[im 1/30]
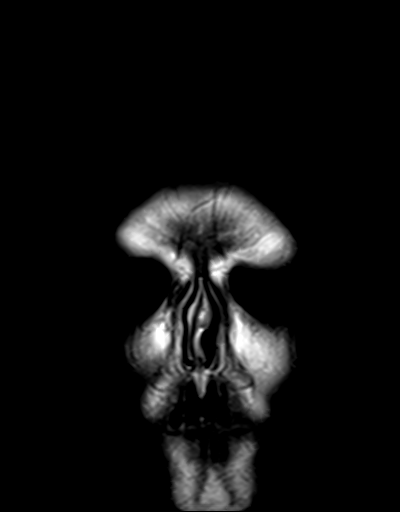
[im 30/30]
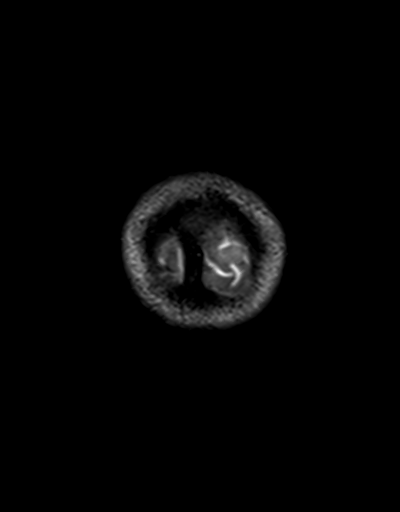

[Series 15: T1 post-contrast · coronal · 5.0mm · 0.72mm/px · 2 of 30 slices shown]
[im 1/30]
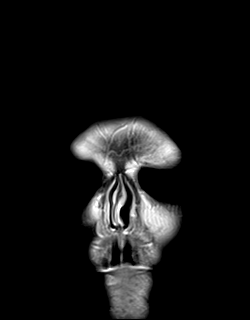
[im 30/30]
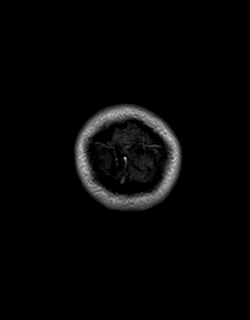

[Series 16: post t1_mpr_tra · axial · 1.0mm · 0.72mm/px · z∈[-70,+89]mm · 9 of 160 slices shown]
[im 1/160]
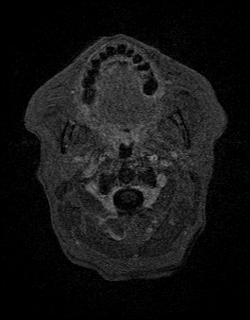
[im 20/160]
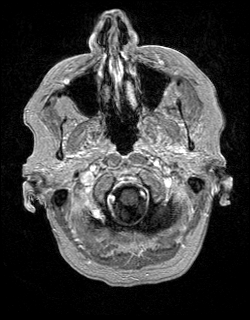
[im 40/160]
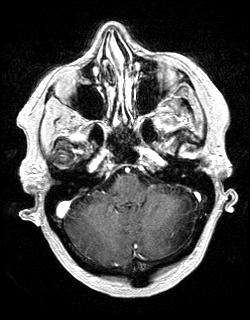
[im 60/160]
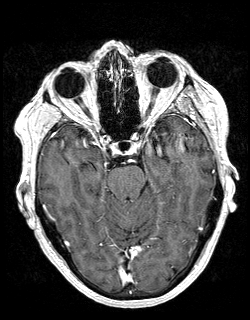
[im 80/160]
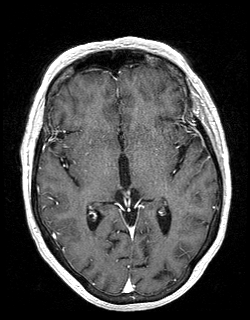
[im 100/160]
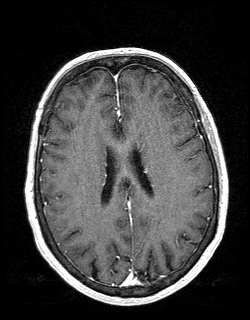
[im 120/160]
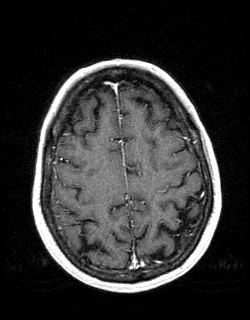
[im 140/160]
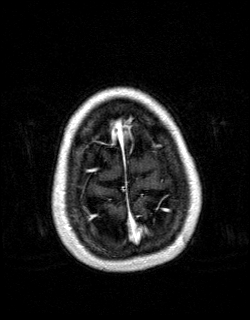
[im 160/160]
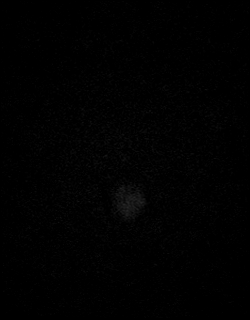

[48 of 48 positions shown; findings below may reference images not displayed]

FINDINGS: Brain: Mild atrophy and white matter changes are within normal
limits for age. No acute infarct, hemorrhage, or mass lesion is
present. The ventricles are of normal size. Mild white matter
changes extend into the brainstem. A remote lacunar infarct is noted
in the superior left cerebellum.

Internal auditory canals are within normal limits bilaterally. The
seventh and eighth cranial nerves are discretely visualized and
within normal limits bilaterally.

The postcontrast images demonstrate no pathologic enhancement.

Vascular: Flow is present in the major intracranial arteries.

Skull and upper cervical spine: The skullbase is within normal
limits. Midline sagittal structures are unremarkable. Craniocervical
junction is normal. Marrow signal is normal.

Sinuses/Orbits: The paranasal sinuses are clear. The mastoid air
cells are clear. Bilateral lens replacements are present. The globes
and orbits are otherwise within normal limits.
IMPRESSION: 1. Normal MRI appearance of the brain for age.
2. No acute or focal lesion to explain the patient's left-sided pain
or hearing loss.

## 2018-02-12 ENCOUNTER — Ambulatory Visit (INDEPENDENT_AMBULATORY_CARE_PROVIDER_SITE_OTHER): Payer: Medicare Other | Admitting: Family Medicine

## 2018-02-12 ENCOUNTER — Encounter: Payer: Self-pay | Admitting: Family Medicine

## 2018-02-12 ENCOUNTER — Other Ambulatory Visit: Payer: Self-pay

## 2018-02-12 VITALS — BP 118/78 | HR 71 | Temp 97.7°F | Ht 62.0 in | Wt 144.6 lb

## 2018-02-12 DIAGNOSIS — E538 Deficiency of other specified B group vitamins: Secondary | ICD-10-CM

## 2018-02-12 DIAGNOSIS — Z23 Encounter for immunization: Secondary | ICD-10-CM

## 2018-02-12 DIAGNOSIS — E038 Other specified hypothyroidism: Secondary | ICD-10-CM

## 2018-02-12 DIAGNOSIS — K219 Gastro-esophageal reflux disease without esophagitis: Secondary | ICD-10-CM | POA: Diagnosis not present

## 2018-02-12 DIAGNOSIS — Z1159 Encounter for screening for other viral diseases: Secondary | ICD-10-CM

## 2018-02-12 LAB — TSH: TSH: 3.58 u[IU]/mL (ref 0.35–4.50)

## 2018-02-12 LAB — VITAMIN B12: VITAMIN B 12: 1326 pg/mL — AB (ref 211–911)

## 2018-02-12 NOTE — Addendum Note (Signed)
Addended by: Anibal Henderson on: 02/12/2018 09:38 AM   Modules accepted: Orders

## 2018-02-12 NOTE — Progress Notes (Signed)
Subjective:     [name], female   DOB: 1946-02-05, 72 y.o.   MRN: [id number]  HPI Patient seen for medical follow-up.  She has history of GERD, hypothyroidism, osteopenia, B12 deficiency, postmenopausal.  She is still followed by GYN remains on low-dose Estrace.  Long history of GERD.  She is on high-dose Prilosec 40 mg twice daily.  She has not tried tapering back.  No recent breakthrough symptoms.  No recent dysphagia.  Diagnosed with B12 deficiency last year.  She currently takes at thousand micrograms daily orally.  Last B12 level is still less than 300.  Hypothyroidism which is treated with regular medication.  Good compliance with use.  Denies any recent chest pains.  Her main stressor is dealing with her husband who has Alzheimer's dementia.  Patient needs flu vaccine.  Also no history of hepatitis C screening.  Past Medical History:  Diagnosis Date  . Abdominal pain, epigastric 08/22/2009  . Adenomatous colon polyp   . Chronic low back pain   . Diverticulosis   . Duodenal ulcer   . GERD (gastroesophageal reflux disease)   . Hemorrhoids   . Hiatal hernia   . HYPOTHYROIDISM 07/20/2008  . Insomnia   . NSVD (normal spontaneous vaginal delivery)    X2   Past Surgical History:  Procedure Laterality Date  . ABDOMINAL HYSTERECTOMY     TVH  . BACK SURGERY     RUPTURED DISC  . BLEPHAROPLASTY    . CHOLECYSTECTOMY    . COLONOSCOPY  JAN 2014   NEXT COLONOSCOPY IN 10 YEARS  . DILATION AND CURETTAGE OF UTERUS    . EYE SURGERY  2008   BILATERAL CATARACT     reports that she quit smoking about 14 years ago. Her smoking use included cigarettes. She has a 60.00 pack-year smoking history. She has never used smokeless tobacco. She reports that she does not drink alcohol or use drugs. family history includes Cancer in her father and mother; Diabetes in her brother, mother, and sister; Heart disease in her brother, daughter, and father; Hypertension in her brother and  sister. Allergies  Allergen Reactions  . Aspirin     REACTION: Hives     Review of Systems  Constitutional: Positive for fatigue. Negative for appetite change and unexpected weight change.  Eyes: Negative for visual disturbance.  Respiratory: Negative for cough, chest tightness, shortness of breath and wheezing.   Cardiovascular: Negative for chest pain, palpitations and leg swelling.  Gastrointestinal: Negative for abdominal pain.  Genitourinary: Negative for dysuria.  Neurological: Negative for dizziness, seizures, syncope, weakness, light-headedness and headaches.       Objective:   Physical Exam  Constitutional: She is oriented to person, place, and time. She appears well-developed and well-nourished.  Cardiovascular: Normal rate and regular rhythm.  Pulmonary/Chest: Effort normal and breath sounds normal.  Musculoskeletal: She exhibits no edema.  Neurological: She is alert and oriented to person, place, and time.       Assessment:     #1 hypothyroidism  #2 Long history of GERD.  Currently on high-dose Prilosec  #3 B12 deficiency now on oral replacement    Plan:     -We recommend she reduce her Prilosec to once daily.  May consider further tapering if stable on this dose at follow-up -Recheck TSH and B12 level.  We will also check hepatitis C antibody as she has not been screened previously -Flu vaccine given -Suggested Medicare wellness visit and she will consider scheduling -  She was given some resources for family members with Alzheimer's  Eulas Post MD [facility]

## 2018-02-12 NOTE — Patient Instructions (Signed)
I would like for you you to schedule a Medicare Annual Wellness Visit (AWV).   This is a yearly appointment with our Health Coach Wynetta Fines, RN) and is designed to develop a personalized prevention plan. This is not a head to toe physical, but rather an opportunity to prevent illness based on your current health and risk factors for disease.   Visits usually last 30-60 minutes and include various screenings for hearing, vision, depression, and dementia, falls, and safety concerns. The visit also includes diet and exercise counseling and information about advance directives.   This is also an opportunity to discuss appropriate health maintenance testing such as mammography, colonoscopy, lung cancer screening, and hepatitis C testing.   The AWV is fully covered by Medicare Part B if:  . You have had Part B for over 12 months, AND . You have not had an AWV in the past 12 months .   Try reducing the Prilosec to once daily.

## 2018-02-13 LAB — HEPATITIS C ANTIBODY
Hepatitis C Ab: NONREACTIVE
SIGNAL TO CUT-OFF: 0.01 (ref <1.00)

## 2018-02-14 ENCOUNTER — Ambulatory Visit: Payer: Medicare Other

## 2018-02-27 ENCOUNTER — Other Ambulatory Visit: Payer: Self-pay | Admitting: Internal Medicine

## 2018-03-30 ENCOUNTER — Other Ambulatory Visit: Payer: Self-pay | Admitting: Family Medicine

## 2018-03-31 ENCOUNTER — Other Ambulatory Visit: Payer: Self-pay

## 2018-05-14 ENCOUNTER — Other Ambulatory Visit: Payer: Self-pay | Admitting: Obstetrics & Gynecology

## 2018-05-16 ENCOUNTER — Telehealth: Payer: Self-pay | Admitting: Internal Medicine

## 2018-05-16 ENCOUNTER — Telehealth: Payer: Self-pay | Admitting: *Deleted

## 2018-05-16 ENCOUNTER — Other Ambulatory Visit: Payer: Self-pay | Admitting: Family Medicine

## 2018-05-16 MED ORDER — ESTRADIOL 0.5 MG PO TABS: ORAL_TABLET | ORAL | 3 refills | Status: DC

## 2018-05-16 MED ORDER — LEVOTHYROXINE SODIUM 88 MCG PO TABS: ORAL_TABLET | ORAL | 0 refills | Status: DC

## 2018-05-16 NOTE — Telephone Encounter (Signed)
Pt called in wanting prescription called in to her new RX mail order which is   [facility] [place] # [phone] RX pcn# [id number] RX GRP# [phone] FAX- [phone]  omeprazole (PRILOSEC) 40 MG capsule [[id number]]  sucralfate (CARAFATE) 1 g tablet [[id number]]

## 2018-05-16 NOTE — Telephone Encounter (Signed)
Patient called because refill on estradiol 0.5 mg tablet was denied, per note on 11/21/17 she was to wean and stop HRT. Patient said she has tired several times to stop but not able to due to flashes. She only takes estradiol 0.5 every other day. Okay to send RX?

## 2018-05-16 NOTE — Telephone Encounter (Signed)
She is 73 yo...  Send Estradiol 0.025 please.

## 2018-05-16 NOTE — Telephone Encounter (Signed)
Copied from [place] [id number]. Topic: Quick Communication - Rx Refill/Question >> May 16, 2018  2:17 PM Rayann Heman wrote: Medication: levothyroxine (SYNTHROID) 88 MCG tablet [[id number]]  send to new mail order   Has the patient contacted their pharmacy?  Preferred Pharmacy (with phone number or street name):[facility] [place] [place] - [address] [phone] (Phone) [phone] (Fax)    Agent: Please be advised that RX refills may take up to 3 business days. We ask that you follow-up with your pharmacy.

## 2018-05-16 NOTE — Telephone Encounter (Signed)
Rx sent to mail order

## 2018-05-22 ENCOUNTER — Other Ambulatory Visit: Payer: Self-pay

## 2018-05-22 MED ORDER — OMEPRAZOLE 40 MG PO CPDR: 40.0000 mg | DELAYED_RELEASE_CAPSULE | Freq: Two times a day (BID) | ORAL | 1 refills | Status: DC

## 2018-05-22 MED ORDER — SUCRALFATE 1 G PO TABS: ORAL_TABLET | ORAL | 0 refills | Status: DC

## 2018-05-22 NOTE — Telephone Encounter (Signed)
Refilled Omeprazole and Carafate with [facility]

## 2018-05-22 NOTE — Addendum Note (Signed)
Addended by: Julieanne Cotton K on: 05/22/2018 11:18 AM   Modules accepted: Orders

## 2018-05-23 ENCOUNTER — Other Ambulatory Visit: Payer: Self-pay | Admitting: Obstetrics & Gynecology

## 2018-05-23 MED ORDER — ESTRADIOL 0.5 MG PO TABS: ORAL_TABLET | ORAL | 3 refills | Status: DC

## 2018-05-27 ENCOUNTER — Encounter: Payer: Self-pay | Admitting: Podiatry

## 2018-05-27 ENCOUNTER — Ambulatory Visit (INDEPENDENT_AMBULATORY_CARE_PROVIDER_SITE_OTHER): Payer: Medicare Other | Admitting: Podiatry

## 2018-05-27 DIAGNOSIS — G629 Polyneuropathy, unspecified: Secondary | ICD-10-CM

## 2018-05-27 DIAGNOSIS — I872 Venous insufficiency (chronic) (peripheral): Secondary | ICD-10-CM

## 2018-05-27 MED ORDER — GABAPENTIN 100 MG PO CAPS: 100.0000 mg | ORAL_CAPSULE | Freq: Two times a day (BID) | ORAL | 3 refills | Status: DC

## 2018-05-28 ENCOUNTER — Telehealth: Payer: Self-pay | Admitting: *Deleted

## 2018-05-28 DIAGNOSIS — I872 Venous insufficiency (chronic) (peripheral): Secondary | ICD-10-CM

## 2018-05-28 NOTE — Telephone Encounter (Signed)
Orders and referral faxed to [facility].

## 2018-05-28 NOTE — Telephone Encounter (Signed)
-----   Message from Rip Harbour, [facility] sent at 05/27/2018  3:16 PM EST ----- Regarding: Vascular Vascular referral - venous study for venous insuff and consult

## 2018-05-28 NOTE — Progress Notes (Signed)
She presents today states that the whole forefoot and toes bilaterally left greater than right are starting to tingle and there is numbness and burning times the past 6 months she wakes up at night and can get back to sleep takes arthritis meds for other pains but usually helps the burning at night.  Objective: Vital signs are stable she is alert and oriented x3.  Pulses are palpable.  She does have some swelling in the foot and legs.  Neurologic sensorium is slightly diminished sensation to the tips of the toes and tops of the toes bilaterally.  Deep tendon reflexes are intact.  Muscle strength is normal symmetrical.  Orthopedic evaluation all joints distal to ankle full range of motion without crepitation.  Cutaneous evaluation of straight supple well-hydrated cutis no open lesions or wounds.  Assessment: Cannot rule out venous insufficiency as the cause of the pain.  I do feel that this is more of neuropathy.  Plan: At this point we will start her on gabapentin 100 mg at nighttime for a week and then adding a morning dose as well I will follow-up with her in 1 month for reevaluation of the gabapentin.  I will also follow-up after a venous study then will be performed.

## 2018-06-11 ENCOUNTER — Ambulatory Visit (HOSPITAL_COMMUNITY)
Admission: RE | Admit: 2018-06-11 | Discharge: 2018-06-11 | Disposition: A | Discharge status: Home / Self Care | Payer: Medicare Other | Source: Ambulatory Visit | Attending: Family | Admitting: Family

## 2018-06-11 DIAGNOSIS — I872 Venous insufficiency (chronic) (peripheral): Secondary | ICD-10-CM | POA: Diagnosis not present

## 2018-06-16 ENCOUNTER — Telehealth: Payer: Self-pay | Admitting: *Deleted

## 2018-06-16 DIAGNOSIS — I872 Venous insufficiency (chronic) (peripheral): Secondary | ICD-10-CM

## 2018-06-16 NOTE — Telephone Encounter (Signed)
Left message on home phone for pt to call for results.

## 2018-06-16 NOTE — Telephone Encounter (Signed)
-----   Message from Garrel Ridgel, [place] sent at 06/12/2018  6:01 PM EST ----- Her venous study demonstrates venous insufficiency and she should follow-up with the vascular docs for consult regarding the swelling.

## 2018-06-16 NOTE — Telephone Encounter (Signed)
I informed pt of Dr. Stephenie Acres review of results and referral. Referral, clinicals and demographics to [facility].

## 2018-06-18 ENCOUNTER — Encounter: Payer: Self-pay | Admitting: Surgery

## 2018-06-24 ENCOUNTER — Ambulatory Visit (INDEPENDENT_AMBULATORY_CARE_PROVIDER_SITE_OTHER): Payer: Medicare Other | Admitting: Podiatry

## 2018-06-24 ENCOUNTER — Telehealth: Payer: Self-pay | Admitting: *Deleted

## 2018-06-24 ENCOUNTER — Encounter: Payer: Self-pay | Admitting: Podiatry

## 2018-06-24 DIAGNOSIS — G629 Polyneuropathy, unspecified: Secondary | ICD-10-CM

## 2018-06-24 MED ORDER — PREGABALIN 75 MG PO CAPS: 75.0000 mg | ORAL_CAPSULE | Freq: Two times a day (BID) | ORAL | 3 refills | Status: DC

## 2018-06-24 NOTE — Telephone Encounter (Signed)
Left message requesting a call back with appt date and time for pt.

## 2018-06-24 NOTE — Telephone Encounter (Signed)
-----   Message from Rip Harbour, [facility] sent at 06/24/2018 11:50 AM EST ----- Regarding: Vascular consult Marcy Siren-   I see that you notified the patient of the referral but she said she hasn't heard anything about the appointment. Dr. Milinda Pointer asking if you could follow up on it??  Thanks!!

## 2018-06-24 NOTE — Progress Notes (Signed)
She presents today for follow-up of her neuropathy states that I stopped taking the medication he gave me because it caused him to be agitated at nighttime and I was unable to get back to sleep once I woke up.  But he did not like the side effects drive me nuts.  Objective: Vital signs are stable she is alert and oriented x3.  Venous studies demonstrate an abnormal venous study and we are requesting a consult from vascular.  At this point otherwise physical exam is unchanged.  Assessment: Neuropathy with venous insufficiency.  Plan: I will her to start on Lyrica 75 mg twice daily and make sure she follows up with vascular.  She will notify us with questions or concerns regarding this medication.

## 2018-06-26 NOTE — Telephone Encounter (Signed)
[facility] states pt has an appt 07/28/2018 at 11:00am. Left message for pt to call for appt date and time with Dr. Trula Slade at [facility].

## 2018-06-27 ENCOUNTER — Telehealth: Payer: Self-pay | Admitting: *Deleted

## 2018-06-27 NOTE — Telephone Encounter (Signed)
I informed pt of 07/28/2018 11:00am appt at [facility].

## 2018-06-27 NOTE — Telephone Encounter (Signed)
Entered in error

## 2018-06-30 DIAGNOSIS — H00014 Hordeolum externum left upper eyelid: Secondary | ICD-10-CM | POA: Diagnosis not present

## 2018-07-21 ENCOUNTER — Other Ambulatory Visit: Payer: Self-pay

## 2018-07-21 DIAGNOSIS — I872 Venous insufficiency (chronic) (peripheral): Secondary | ICD-10-CM

## 2018-07-24 ENCOUNTER — Telehealth: Payer: Self-pay | Admitting: Podiatry

## 2018-07-24 ENCOUNTER — Ambulatory Visit (INDEPENDENT_AMBULATORY_CARE_PROVIDER_SITE_OTHER): Payer: Medicare Other | Admitting: Podiatry

## 2018-07-24 ENCOUNTER — Other Ambulatory Visit: Payer: Self-pay

## 2018-07-24 ENCOUNTER — Encounter: Payer: Self-pay | Admitting: Podiatry

## 2018-07-24 DIAGNOSIS — G629 Polyneuropathy, unspecified: Secondary | ICD-10-CM

## 2018-07-24 DIAGNOSIS — I872 Venous insufficiency (chronic) (peripheral): Secondary | ICD-10-CM

## 2018-07-24 MED ORDER — PREGABALIN 150 MG PO CAPS: 150.0000 mg | ORAL_CAPSULE | Freq: Two times a day (BID) | ORAL | 3 refills | Status: DC

## 2018-07-24 NOTE — Addendum Note (Signed)
Addended by: Rip Harbour on: 07/24/2018 05:05 PM   Modules accepted: Orders

## 2018-07-24 NOTE — Progress Notes (Signed)
She presents today for follow-up of her bilateral neuropathy states that I think the Lyrica is helping particularly at night but in the mornings they are swollen including the hands and the face she states that she needs to talk to her primary care doctor about that.  She does not feel that it is associated with her Lyrica since she has had some of this before.  Objective: Vital signs are stable she is alert and oriented x3.  Pulses are palpable.  No change in physical examination.  Assessment: Neuropathy bilaterally.  Plan: We will increase her gabapentin 150 mg.  She will take 150 mg tablets twice daily.  Until then she will double up on the 75 mg that she has at home.  She will take this twice a day and I will follow-up with her in 1 month.

## 2018-07-24 NOTE — Addendum Note (Signed)
Addended by: Tyson Dense T on: 07/24/2018 05:05 PM   Modules accepted: Orders

## 2018-07-24 NOTE — Telephone Encounter (Signed)
Dr. Milinda Pointer wants me to take 150 mg of Lyrica. Am I supposed to take it once or twice a day.

## 2018-07-24 NOTE — Telephone Encounter (Signed)
Dr. Milinda Pointer sent into [facility]

## 2018-07-24 NOTE — Telephone Encounter (Signed)
Pt states Rx needs to go to [facility]. [facility] [place].

## 2018-07-28 ENCOUNTER — Ambulatory Visit (INDEPENDENT_AMBULATORY_CARE_PROVIDER_SITE_OTHER): Payer: Medicare Other | Admitting: Surgery

## 2018-07-28 ENCOUNTER — Encounter: Payer: Self-pay | Admitting: Surgery

## 2018-07-28 ENCOUNTER — Encounter (HOSPITAL_COMMUNITY): Payer: Medicare Other

## 2018-07-28 ENCOUNTER — Other Ambulatory Visit: Payer: Self-pay

## 2018-07-28 VITALS — BP 138/72 | HR 73 | Temp 98.3°F | Resp 14 | Ht 63.0 in | Wt 153.7 lb

## 2018-07-28 DIAGNOSIS — I872 Venous insufficiency (chronic) (peripheral): Secondary | ICD-10-CM | POA: Diagnosis not present

## 2018-07-28 NOTE — Progress Notes (Signed)
[facility] [place]  Patient name: Cassidy Norton MRN: [id number] DOB: Oct 10, 1945 Sex: female   REQUESTING PROVIDER:    Dr. Milinda Pointer   REASON FOR CONSULT:    Venous insufficiency  HISTORY OF PRESENT ILLNESS:   Cassidy Norton is a 73 y.o. female, who is referred today for leg swelling.  The patient states that she has been having left greater than right leg swelling for at least 6 months.  She does not have any open wounds or ulcers.  She also complains of numbness and tingling in both of her feet.  She is being treated for neuropathy.  She is a former smoker.  She denies symptoms of claudication.  She remains active.  PAST MEDICAL HISTORY    Past Medical History:  Diagnosis Date  . Abdominal pain, epigastric 08/22/2009  . Adenomatous colon polyp   . Chronic low back pain   . Diverticulosis   . Duodenal ulcer   . GERD (gastroesophageal reflux disease)   . Hemorrhoids   . Hiatal hernia   . HYPOTHYROIDISM 07/20/2008  . Insomnia   . NSVD (normal spontaneous vaginal delivery)    X2     FAMILY HISTORY   Family History  Problem Relation Age of Onset  . Cancer Mother        bladder CA  . Diabetes Mother   . Cancer Father        gallblader CA  . Heart disease Father   . Diabetes Sister   . Hypertension Sister   . Diabetes Brother   . Hypertension Brother   . Heart disease Brother   . Heart disease Daughter   . Stomach cancer Neg Hx   . Colon cancer Neg Hx     SOCIAL HISTORY:   Social History   Socioeconomic History  . Marital status: Married    Spouse name: Not on file  . Number of children: 1  . Years of education: Not on file  . Highest education level: Not on file  Occupational History  . Occupation: Retired    Fish farm manager: RETIRED  Social Needs  . Financial resource strain: Not on file  . Food insecurity:    Worry: Not on file    Inability: Not on file  . Transportation needs:    Medical: Not  on file    Non-medical: Not on file  Tobacco Use  . Smoking status: Former Smoker    Packs/day: 1.50    Years: 40.00    Pack years: 60.00    Types: Cigarettes    Last attempt to quit: 08/28/2003    Years since quitting: 14.9  . Smokeless tobacco: Never Used  Substance and Sexual Activity  . Alcohol use: No    Alcohol/week: 0.0 standard drinks  . Drug use: No  . Sexual activity: Yes    Birth control/protection: Surgical    Comment: HYSTERECTOMY  Lifestyle  . Physical activity:    Days per week: Not on file    Minutes per session: Not on file  . Stress: Not on file  Relationships  . Social connections:    Talks on phone: Not on file    Gets together: Not on file    Attends religious service: Not on file    Active member of club or organization: Not on file    Attends meetings of clubs or organizations: Not on file    Relationship status: Not on file  . Intimate partner violence:  Fear of current or ex partner: Not on file    Emotionally abused: Not on file    Physically abused: Not on file    Forced sexual activity: Not on file  Other Topics Concern  . Not on file  Social History Narrative  . Not on file    ALLERGIES:    Allergies  Allergen Reactions  . Aspirin     REACTION: Hives    CURRENT MEDICATIONS:    Current Outpatient Medications  Medication Sig Dispense Refill  . acetaminophen (TYLENOL) 650 MG CR tablet Reported on 09/13/2015 4 TABS DAILY    . cycloSPORINE (RESTASIS) 0.05 % ophthalmic emulsion Place 1 drop into the left eye 2 (two) times daily.    Marland Kitchen estradiol (ESTRACE) 0.5 MG tablet Take 1/2 tablet of  tablet every other day 25 tablet 3  . gabapentin (NEURONTIN) 100 MG capsule Take 1 capsule (100 mg total) by mouth 2 (two) times daily. 60 capsule 3  . HYDROcodone-acetaminophen (NORCO) 10-325 MG per tablet Take 0.5 tablets by mouth every 6 (six) hours as needed.     Marland Kitchen levothyroxine (SYNTHROID) 88 MCG tablet TAKE 1 TABLET DAILY. NEED  PHYSICAL AND  LABS IN       SEPTEMBER 90 tablet 0  . Multiple Vitamins-Minerals (PRESERVISION AREDS 2 PO) Take 2 tablets by mouth 2 (two) times daily.     Marland Kitchen omeprazole (PRILOSEC) 40 MG capsule TAKE 1 CAPSULE TWICE DAILY 60 capsule 6  . omeprazole (PRILOSEC) 40 MG capsule Take 1 capsule (40 mg total) by mouth 2 (two) times daily. 180 capsule 1  . polyethylene glycol (MIRALAX / GLYCOLAX) packet Take 17 g by mouth daily as needed.     . pregabalin (LYRICA) 150 MG capsule Take 1 capsule (150 mg total) by mouth 2 (two) times daily. 180 capsule 3  . Propylene Glycol (SYSTANE COMPLETE OP) Apply to eye.    Marland Kitchen Psyllium (METAMUCIL) 30.9 % POWD Take by mouth. One tsp daily at bedtime     . sucralfate (CARAFATE) 1 g tablet TAKE 1 TABLET 4 TIMES DAILYWITH MEALS AND AT BEDTIME 360 tablet 0  . vitamin B-12 (CYANOCOBALAMIN) 1000 MCG tablet Take 1,000 mcg by mouth daily.     No current facility-administered medications for this visit.     REVIEW OF SYSTEMS:   [X]  denotes positive finding, [ ]  denotes negative finding Cardiac  Comments:  Chest pain or chest pressure:    Shortness of breath upon exertion:    Short of breath when lying flat:    Irregular heart rhythm:        Vascular    Pain in calf, thigh, or hip brought on by ambulation:    Pain in feet at night that wakes you up from your sleep:  x   Blood clot in your veins:    Leg swelling:         Pulmonary    Oxygen at home:    Productive cough:     Wheezing:         Neurologic    Sudden weakness in arms or legs:     Sudden numbness in arms or legs:     Sudden onset of difficulty speaking or slurred speech:    Temporary loss of vision in one eye:     Problems with dizziness:         Gastrointestinal    Blood in stool:      Vomited blood:  Genitourinary    Burning when urinating:     Blood in urine:        Psychiatric    Major depression:         Hematologic    Bleeding problems:    Problems with blood clotting too easily:         Skin    Rashes or ulcers:        Constitutional    Fever or chills:     PHYSICAL EXAM:   Vitals:   07/28/18 1220  BP: 138/72  Pulse: 73  Resp: 14  Temp: 98.3 F (36.8 C)  SpO2: 96%  Weight: 69.7 kg  Height: 5\' 3"  (1.6 m)    GENERAL: The patient is a well-nourished female, in no acute distress. The vital signs are documented above. CARDIAC: There is a regular rate and rhythm.  VASCULAR: 1+ left leg edema, trace right leg edema PULMONARY: Nonlabored respirations  MUSCULOSKELETAL: There are no major deformities or cyanosis. NEUROLOGIC: No focal weakness or paresthesias are detected. SKIN: There are no ulcers or rashes noted. PSYCHIATRIC: The patient has a normal affect.  STUDIES:   I have reviewed his u/s with the following findings:  Venous Reflux Times Normal value < 0.5 sec +------------------------------+----------+---------+                               Right (ms)Left (ms) +------------------------------+----------+---------+ GSV at Saphenofemoral junction4306.00             +------------------------------+----------+---------+ GSV prox thigh                3213.00             +------------------------------+----------+---------+ GSV prox calf                           6301.00   +------------------------------+----------+---------+  Vein Diameters: +------------------------------+----------+---------+                               Right (cm)Left (cm) +------------------------------+----------+---------+ GSV at Saphenofemoral junction0.62      1.06      +------------------------------+----------+---------+ GSV at prox thigh             0.43      0.22      +------------------------------+----------+---------+ GSV at mid thigh              0.27      0.16      +------------------------------+----------+---------+ GSV at distal thigh           0.27      0.23      +------------------------------+----------+---------+ GSV at  knee                   0.27      0.18      +------------------------------+----------+---------+ GSV prox calf                 0.28      0.32      +------------------------------+----------+---------+ GSV mid calf                  0.16      0.16      +------------------------------+----------+---------+ GSV dist calf                                     +------------------------------+----------+---------+  SSV origin                    0.11      0.22      +------------------------------+----------+---------+ SSV prox                      0.12      0.19      +------------------------------+----------+---------+ SSV mid                       0.13      0.15      +------------------------------+----------+---------+      ASSESSMENT and PLAN   Bilateral lower extremity edema, left greater than right: Venous reflux evaluation suggests that venous insufficiency is not the etiology of her leg swelling.  This most likely represents lymphedema.  We discussed the importance of leg elevation and compression stockings.  She was giving information about obtaining 20-30 compression.  No acute vascular intervention is recommended at this time.  She will follow-up on an as-needed basis.   Leia Alf, MD, FACS [facility] [phone] Pager [phone]

## 2018-08-06 ENCOUNTER — Telehealth: Payer: Self-pay | Admitting: Podiatry

## 2018-08-06 NOTE — Telephone Encounter (Signed)
Patient states she got confused in what milligram she was taking. Currently she is taking 300mg  of Lyrica daily and this seems to be managing her symptoms. She does not need a refill, she found her bottles she was looking for.

## 2018-08-06 NOTE — Telephone Encounter (Signed)
Pt called and needs someone to call the pharmacy  to say its ok to refill her medication. She was accidentally doubling up taking 4 a day instead of 2 a day of the Pregamlin 75mg  and has ran out. [facility] [place]

## 2018-08-21 ENCOUNTER — Ambulatory Visit: Payer: Medicare Other | Admitting: Podiatry

## 2018-08-28 ENCOUNTER — Other Ambulatory Visit: Payer: Self-pay | Admitting: Internal Medicine

## 2018-09-16 ENCOUNTER — Ambulatory Visit (INDEPENDENT_AMBULATORY_CARE_PROVIDER_SITE_OTHER): Payer: Medicare Other | Admitting: Podiatry

## 2018-09-16 ENCOUNTER — Encounter: Payer: Self-pay | Admitting: Podiatry

## 2018-09-16 ENCOUNTER — Other Ambulatory Visit: Payer: Self-pay

## 2018-09-16 VITALS — Temp 97.3°F

## 2018-09-16 DIAGNOSIS — G5790 Unspecified mononeuropathy of unspecified lower limb: Secondary | ICD-10-CM

## 2018-09-17 ENCOUNTER — Encounter: Payer: Self-pay | Admitting: Podiatry

## 2018-09-17 NOTE — Progress Notes (Signed)
She presents today for follow-up of her neuropathy she states that she feels that the 150 mg in the morning of Lyrica in the 150 mg of afternoon Lyrica seem to be doing the trick.  States that she still has some tenderness in the big toe of the right foot and it occasionally throbs.  Denies changes in her past medical history medications allergies surgery social history.  Review of systems denies fever chills nausea vomiting muscle aches pains calf pain back pain chest pain shortness of breath.  Objective: Pulses are palpable.  No change in physical exam she does have tenderness on palpation range of motion of the first metatarsal phalangeal joint of the right foot consistent with osteoarthritic changes.  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  Plan: Discussed etiology pathology and surgical therapies at this point offered her an injection to the first MTPJ she declined.  We will continue with the Lyrica 150 mg twice daily.  Follow-up with her in 3 months

## 2018-11-04 DIAGNOSIS — Z1231 Encounter for screening mammogram for malignant neoplasm of breast: Secondary | ICD-10-CM | POA: Diagnosis not present

## 2018-11-21 ENCOUNTER — Ambulatory Visit: Payer: Medicare Other | Admitting: Family Medicine

## 2018-12-01 ENCOUNTER — Other Ambulatory Visit: Payer: Self-pay

## 2018-12-02 ENCOUNTER — Encounter: Payer: Self-pay | Admitting: Obstetrics & Gynecology

## 2018-12-02 ENCOUNTER — Ambulatory Visit (INDEPENDENT_AMBULATORY_CARE_PROVIDER_SITE_OTHER): Payer: Medicare Other | Admitting: Obstetrics & Gynecology

## 2018-12-02 VITALS — BP 124/76 | Ht 62.0 in | Wt 150.0 lb

## 2018-12-02 DIAGNOSIS — Z01419 Encounter for gynecological examination (general) (routine) without abnormal findings: Secondary | ICD-10-CM

## 2018-12-02 DIAGNOSIS — N9089 Other specified noninflammatory disorders of vulva and perineum: Secondary | ICD-10-CM

## 2018-12-02 DIAGNOSIS — Z79890 Hormone replacement therapy (postmenopausal): Secondary | ICD-10-CM | POA: Diagnosis not present

## 2018-12-02 DIAGNOSIS — M85852 Other specified disorders of bone density and structure, left thigh: Secondary | ICD-10-CM

## 2018-12-02 DIAGNOSIS — Z90710 Acquired absence of both cervix and uterus: Secondary | ICD-10-CM

## 2018-12-02 MED ORDER — CLOBETASOL PROPIONATE 0.05 % EX OINT: 1.0000 "application " | TOPICAL_OINTMENT | CUTANEOUS | 3 refills | Status: DC

## 2018-12-02 NOTE — Progress Notes (Signed)
Cassidy Norton August 24, 1945 [id number]   History:    73 y.o. G3P2A1L2 Married.  Husband with worsening Alzheimer.  RP:  Established patient presenting for annual gyn exam   HPI: S/P Total Hysterectomy. Menopause with persistent hot flushes/night sweats when tried to stop HRT.  Still on Estradiol 0.5 mg tab every 2 days.  No vasomotor symptoms at all on that dosage since last year.  No pelvic pain.  No pain with IC.  Occasional vulvar irritation/burning with urine contact, helped with Triamcinolone cream 1%.  Urine/BMs wnl.  Breasts wnl.  Health Labs with Fam MD. Seen by GE last week, [place] 2016.   Past medical history,surgical history, family history and social history were all reviewed and documented in the EPIC chart.  Gynecologic History No LMP recorded. Patient has had a hysterectomy. Contraception: status post hysterectomy Last Pap: 07/2010. Results were: Negative Last mammogram: 10/2018. Results were: Benign Bone Density: 09/2014 Osteopenia Left femoral neck -1.2 Colonoscopy: 2014  Obstetric History OB History  Gravida Para Term Preterm AB Living  3 2 2   1 2   SAB TAB Ectopic Multiple Live Births  1       2    # Outcome Date GA Lbr Len/2nd Weight Sex Delivery Anes PTL Lv  3 SAB           2 Term     F Vag-Spont  N LIV  1 Term     M Vag-Spont  N LIV     Birth Comments: has died in a car accident at 27 yrs     ROS: A ROS was performed and pertinent positives and negatives are included in the history.  GENERAL: No fevers or chills. HEENT: No change in vision, no earache, sore throat or sinus congestion. NECK: No pain or stiffness. CARDIOVASCULAR: No chest pain or pressure. No palpitations. PULMONARY: No shortness of breath, cough or wheeze. GASTROINTESTINAL: No abdominal pain, nausea, vomiting or diarrhea, melena or bright red blood per rectum. GENITOURINARY: No urinary frequency, urgency, hesitancy or dysuria. MUSCULOSKELETAL: No joint or muscle pain, no back pain, no recent  trauma. DERMATOLOGIC: No rash, no itching, no lesions. ENDOCRINE: No polyuria, polydipsia, no heat or cold intolerance. No recent change in weight. HEMATOLOGICAL: No anemia or easy bruising or bleeding. NEUROLOGIC: No headache, seizures, numbness, tingling or weakness. PSYCHIATRIC: No depression, no loss of interest in normal activity or change in sleep pattern.     Exam:   BP 124/76   Ht 5\' 2"  (1.575 m)   Wt 150 lb (68 kg)   BMI 27.44 kg/m   Body mass index is 27.44 kg/m.  General appearance : Well developed well nourished female. No acute distress HEENT: Eyes: no retinal hemorrhage or exudates,  Neck supple, trachea midline, no carotid bruits, no thyroidmegaly Lungs: Clear to auscultation, no rhonchi or wheezes, or rib retractions  Heart: Regular rate and rhythm, no murmurs or gallops Breast:Examined in sitting and supine position were symmetrical in appearance, no palpable masses or tenderness,  no skin retraction, no nipple inversion, no nipple discharge, no skin discoloration, no axillary or supraclavicular lymphadenopathy Abdomen: no palpable masses or tenderness, no rebound or guarding Extremities: no edema or skin discoloration or tenderness  Pelvic: Vulva: Normal             Vagina: No gross lesions or discharge.  Pap reflex done.  Cervix/Uterus absent  Adnexa  Without masses or tenderness  Anus: Normal   Assessment/Plan:  73 y.o. female for  annual exam   1. Well female exam with routine gynecological exam Gynecologic exam status post total hysterectomy and menopause.  Pap reflex done on the vaginal vault.  Breast exam normal.  Screening mammogram June 2020 benign.  Health labs with family physician.  Body mass index 27.44.  Recommend decreasing slightly calories/carbs in nutrition.  Aerobic physical activities 5 times a week and weightlifting every 2 days.  2. S/P total hysterectomy  3. Postmenopausal HRT (hormone replacement therapy) Difficulty weaning and stopping  hormone replacement therapy.  Strongly recommended to wean and stop as soon as possible.  We will try to take estradiol half a tablet of 0.5 mg every 3 days and then further wean and stop if vasomotor symptoms of menopause are tolerable.  Risks of breast cancer and blood clot/strokes reviewed with patient.  4. Osteopenia of neck of left femur Scheduled BD here 12/2018.  Recommend to continue with vitamin D supplements, calcium intake of 1200 mg daily and regular weightbearing physical activities.  5. Vulvar irritation Vulvar exam normal today.  Will use clobetasol ointment as needed when irritation is present.  Also recommend using Vaseline to protect the vulva as needed.  Other orders - clobetasol ointment (TEMOVATE) 0.05 %; Apply 1 application topically 2 (two) times a week. Thin vulvar application - estradiol (ESTRACE) 0.5 MG tablet; Take 1/2 tablet of  tablet every other day  Counseling on above issues and coordination of care more than 50% for 10 minutes.  Princess Bruins MD, 11:12 AM 12/02/2018

## 2018-12-03 ENCOUNTER — Encounter: Payer: Self-pay | Admitting: Obstetrics & Gynecology

## 2018-12-03 MED ORDER — ESTRADIOL 0.5 MG PO TABS: ORAL_TABLET | ORAL | 4 refills | Status: DC

## 2018-12-03 NOTE — Patient Instructions (Signed)
1. Well female exam with routine gynecological exam Gynecologic exam status post total hysterectomy and menopause.  Pap reflex done on the vaginal vault.  Breast exam normal.  Screening mammogram June 2020 benign.  Health labs with family physician.  Body mass index 27.44.  Recommend decreasing slightly calories/carbs in nutrition.  Aerobic physical activities 5 times a week and weightlifting every 2 days.  2. S/P total hysterectomy  3. Postmenopausal HRT (hormone replacement therapy) Difficulty weaning and stopping hormone replacement therapy.  Strongly recommended to wean and stop as soon as possible.  We will try to take estradiol half a tablet of 0.5 mg every 3 days and then further wean and stop if vasomotor symptoms of menopause are tolerable.  Risks of breast cancer and blood clot/strokes reviewed with patient.  4. Osteopenia of neck of left femur Scheduled BD here 12/2018.  Recommend to continue with vitamin D supplements, calcium intake of 1200 mg daily and regular weightbearing physical activities.  5. Vulvar irritation Vulvar exam normal today.  Will use clobetasol ointment as needed when irritation is present.  Also recommend using Vaseline to protect the vulva as needed.  Other orders - clobetasol ointment (TEMOVATE) 0.05 %; Apply 1 application topically 2 (two) times a week. Thin vulvar application - estradiol (ESTRACE) 0.5 MG tablet; Take 1/2 tablet of  tablet every other day  Avaeh, it was a pleasure seeing you today!  I will inform you of your results as soon as they are available.

## 2018-12-04 DIAGNOSIS — H353131 Nonexudative age-related macular degeneration, bilateral, early dry stage: Secondary | ICD-10-CM | POA: Diagnosis not present

## 2018-12-04 DIAGNOSIS — Z961 Presence of intraocular lens: Secondary | ICD-10-CM | POA: Diagnosis not present

## 2018-12-04 DIAGNOSIS — H35033 Hypertensive retinopathy, bilateral: Secondary | ICD-10-CM | POA: Diagnosis not present

## 2018-12-04 DIAGNOSIS — H04123 Dry eye syndrome of bilateral lacrimal glands: Secondary | ICD-10-CM | POA: Diagnosis not present

## 2018-12-16 ENCOUNTER — Ambulatory Visit: Payer: Medicare Other | Admitting: Podiatry

## 2018-12-17 ENCOUNTER — Other Ambulatory Visit: Payer: Self-pay | Admitting: Family Medicine

## 2019-01-07 ENCOUNTER — Other Ambulatory Visit: Payer: Self-pay | Admitting: Dermatology

## 2019-01-07 DIAGNOSIS — H61001 Unspecified perichondritis of right external ear: Secondary | ICD-10-CM | POA: Diagnosis not present

## 2019-01-07 DIAGNOSIS — H61031 Chondritis of right external ear: Secondary | ICD-10-CM | POA: Diagnosis not present

## 2019-01-07 DIAGNOSIS — D0461 Carcinoma in situ of skin of right upper limb, including shoulder: Secondary | ICD-10-CM | POA: Diagnosis not present

## 2019-01-07 DIAGNOSIS — C4492 Squamous cell carcinoma of skin, unspecified: Secondary | ICD-10-CM

## 2019-01-07 HISTORY — DX: Squamous cell carcinoma of skin, unspecified: C44.92

## 2019-01-29 DIAGNOSIS — D0461 Carcinoma in situ of skin of right upper limb, including shoulder: Secondary | ICD-10-CM | POA: Diagnosis not present

## 2019-02-03 ENCOUNTER — Other Ambulatory Visit: Payer: Self-pay

## 2019-02-03 ENCOUNTER — Ambulatory Visit (INDEPENDENT_AMBULATORY_CARE_PROVIDER_SITE_OTHER): Payer: Medicare Other

## 2019-02-03 DIAGNOSIS — Z23 Encounter for immunization: Secondary | ICD-10-CM

## 2019-02-10 ENCOUNTER — Other Ambulatory Visit: Payer: Self-pay | Admitting: *Deleted

## 2019-02-10 MED ORDER — PREGABALIN 150 MG PO CAPS: 150.0000 mg | ORAL_CAPSULE | Freq: Two times a day (BID) | ORAL | 3 refills | Status: DC

## 2019-02-17 ENCOUNTER — Ambulatory Visit (INDEPENDENT_AMBULATORY_CARE_PROVIDER_SITE_OTHER): Payer: Medicare Other | Admitting: Podiatry

## 2019-02-17 ENCOUNTER — Other Ambulatory Visit: Payer: Self-pay

## 2019-02-17 ENCOUNTER — Encounter: Payer: Self-pay | Admitting: Podiatry

## 2019-02-17 DIAGNOSIS — M778 Other enthesopathies, not elsewhere classified: Secondary | ICD-10-CM | POA: Diagnosis not present

## 2019-02-17 DIAGNOSIS — G5790 Unspecified mononeuropathy of unspecified lower limb: Secondary | ICD-10-CM | POA: Diagnosis not present

## 2019-02-18 ENCOUNTER — Encounter: Payer: Self-pay | Admitting: Podiatry

## 2019-02-18 ENCOUNTER — Other Ambulatory Visit: Payer: Self-pay | Admitting: Podiatry

## 2019-02-18 ENCOUNTER — Telehealth: Payer: Self-pay | Admitting: Podiatry

## 2019-02-18 MED ORDER — PREGABALIN 150 MG PO CAPS: 150.0000 mg | ORAL_CAPSULE | Freq: Two times a day (BID) | ORAL | 3 refills | Status: DC

## 2019-02-18 NOTE — Progress Notes (Signed)
She presents today for follow-up of her neuropathy and the numbness in her big toe.  She states that the neuropathy at nighttime is under control with the Lyrica I does can get this numbness to go away in my big toe.  She states that it is numb but it hurts at the same time.  Objective: Vital signs are stable alert and oriented x3 pulses are palpable.  She has no pain on range of motion on palpation of the hallux right.  Otherwise no changes on physical exam.  Pulses remain palpable neurologic sensorium diminished.  Assessment: Neuropathy cannot rule out her radiculopathy for the right hallux.  She does have a history of back pain surgery and injections.  Plan: Continue Lyrica treatment follow-up with me as needed

## 2019-02-18 NOTE — Telephone Encounter (Signed)
These should go through you not just to me. I sent it over though.

## 2019-02-18 NOTE — Telephone Encounter (Signed)
Patient would like a refill on her Lyrica. Patient was seen by Dr. Milinda Pointer on Yesterday. [facility] [place]. She only has 3 pills left

## 2019-02-23 ENCOUNTER — Other Ambulatory Visit: Payer: Self-pay | Admitting: Family Medicine

## 2019-03-23 ENCOUNTER — Other Ambulatory Visit: Payer: Self-pay

## 2019-03-23 ENCOUNTER — Ambulatory Visit (INDEPENDENT_AMBULATORY_CARE_PROVIDER_SITE_OTHER): Payer: Medicare Other | Admitting: Family Medicine

## 2019-03-23 ENCOUNTER — Encounter: Payer: Self-pay | Admitting: Family Medicine

## 2019-03-23 VITALS — BP 122/66 | HR 72 | Temp 98.1°F | Resp 16 | Ht 62.0 in | Wt 154.0 lb

## 2019-03-23 DIAGNOSIS — G6289 Other specified polyneuropathies: Secondary | ICD-10-CM

## 2019-03-23 DIAGNOSIS — E038 Other specified hypothyroidism: Secondary | ICD-10-CM

## 2019-03-23 DIAGNOSIS — G629 Polyneuropathy, unspecified: Secondary | ICD-10-CM | POA: Insufficient documentation

## 2019-03-23 LAB — TSH: TSH: 17.4 u[IU]/mL — ABNORMAL HIGH (ref 0.35–4.50)

## 2019-03-23 NOTE — Progress Notes (Signed)
  Subjective:     [name], female   DOB: 04-22-1946, 73 y.o.   MRN: [id number]  HPI Rogers is here for follow-up regarding hypothyroidism.  She is on levothyroxine 88 mcg and taking regularly.  She is due for follow-up labs.  Her major concern is stress regarding that her husband has advancing dementia.  He is sleeping generally through the day and up frequently at night.  She has tried a couple over-the-counter medications for him for sleep without much improvement.  Shaylynne denies any cold intolerance or any other major symptoms of overt hypothyroidism.  No recent falls.  She has history of some peripheral neuropathy and is on Lyrica which seems to be helping.  She does have history of B12 deficiency in the past and is currently on replacement.  Most recent B12 levels were normal  Past Medical History:  Diagnosis Date  . Abdominal pain, epigastric 08/22/2009  . Adenomatous colon polyp   . Chronic low back pain   . Diverticulosis   . Duodenal ulcer   . GERD (gastroesophageal reflux disease)   . Hemorrhoids   . Hiatal hernia   . HYPOTHYROIDISM 07/20/2008  . Insomnia   . NSVD (normal spontaneous vaginal delivery)    X2   Past Surgical History:  Procedure Laterality Date  . ABDOMINAL HYSTERECTOMY     TVH  . BACK SURGERY     RUPTURED DISC  . BLEPHAROPLASTY    . CHOLECYSTECTOMY    . COLONOSCOPY  JAN 2014   NEXT COLONOSCOPY IN 10 YEARS  . DILATION AND CURETTAGE OF UTERUS    . EYE SURGERY  2008   BILATERAL CATARACT     reports that she quit smoking about 15 years ago. Her smoking use included cigarettes. She has a 60.00 pack-year smoking history. She has never used smokeless tobacco. She reports that she does not drink alcohol or use drugs. family history includes Cancer in her father and mother; Diabetes in her brother, mother, and sister; Heart disease in her brother, daughter, and father; Hypertension in her brother and sister. Allergies  Allergen Reactions  .  Aspirin     REACTION: Hives     Review of Systems  Constitutional: Negative for appetite change, chills and fever.  Respiratory: Negative for shortness of breath.   Cardiovascular: Negative for chest pain.  Endocrine: Negative for cold intolerance.       Objective:   Physical Exam Constitutional:      Appearance: Normal appearance.  Neck:     Musculoskeletal: Neck supple.  Cardiovascular:     Rate and Rhythm: Normal rate and regular rhythm.  Pulmonary:     Effort: Pulmonary effort is normal.     Breath sounds: Normal breath sounds.  Musculoskeletal:     Comments: Trace edema legs bilaterally  Lymphadenopathy:     Cervical: No cervical adenopathy.  Neurological:     Mental Status: She is alert.        Assessment:     Hypothyroidism. Plan:     -Recheck TSH -We will refill her levothyroxine after lab results back to make sure we do not need to make any adjustments -Follow-up in 1 year and sooner as needed  Eulas Post MD [facility]

## 2019-03-23 NOTE — Patient Instructions (Signed)
Hypothyroidism  Hypothyroidism is when the thyroid gland does not make enough of certain hormones (it is underactive). The thyroid gland is a small gland located in the lower front part of the neck, just in front of the windpipe (trachea). This gland makes hormones that help control how the body uses food for energy (metabolism) as well as how the heart and brain function. These hormones also play a role in keeping your bones strong. When the thyroid is underactive, it produces too little of the hormones thyroxine (T4) and triiodothyronine (T3). What are the causes? This condition may be caused by:  Hashimoto's disease. This is a disease in which the body's disease-fighting system (immune system) attacks the thyroid gland. This is the most common cause.  Viral infections.  Pregnancy.  Certain medicines.  Birth defects.  Past radiation treatments to the head or neck for cancer.  Past treatment with radioactive iodine.  Past exposure to radiation in the environment.  Past surgical removal of part or all of the thyroid.  Problems with a gland in the center of the brain (pituitary gland).  Lack of enough iodine in the diet. What increases the risk? You are more likely to develop this condition if:  You are female.  You have a family history of thyroid conditions.  You use a medicine called lithium.  You take medicines that affect the immune system (immunosuppressants). What are the signs or symptoms? Symptoms of this condition include:  Feeling as though you have no energy (lethargy).  Not being able to tolerate cold.  Weight gain that is not explained by a change in diet or exercise habits.  Lack of appetite.  Dry skin.  Coarse hair.  Menstrual irregularity.  Slowing of thought processes.  Constipation.  Sadness or depression. How is this diagnosed? This condition may be diagnosed based on:  Your symptoms, your medical history, and a physical exam.  Blood  tests. You may also have imaging tests, such as an ultrasound or MRI. How is this treated? This condition is treated with medicine that replaces the thyroid hormones that your body does not make. After you begin treatment, it may take several weeks for symptoms to go away. Follow these instructions at home:  Take over-the-counter and prescription medicines only as told by your health care provider.  If you start taking any new medicines, tell your health care provider.  Keep all follow-up visits as told by your health care provider. This is important. ? As your condition improves, your dosage of thyroid hormone medicine may change. ? You will need to have blood tests regularly so that your health care provider can monitor your condition. Contact a health care provider if:  Your symptoms do not get better with treatment.  You are taking thyroid replacement medicine and you: ? Sweat a lot. ? Have tremors. ? Feel anxious. ? Lose weight rapidly. ? Cannot tolerate heat. ? Have emotional swings. ? Have diarrhea. ? Feel weak. Get help right away if you have:  Chest pain.  An irregular heartbeat.  A rapid heartbeat.  Difficulty breathing. Summary  Hypothyroidism is when the thyroid gland does not make enough of certain hormones (it is underactive).  When the thyroid is underactive, it produces too little of the hormones thyroxine (T4) and triiodothyronine (T3).  The most common cause is Hashimoto's disease, a disease in which the body's disease-fighting system (immune system) attacks the thyroid gland. The condition can also be caused by viral infections, medicine, pregnancy, or past   radiation treatment to the head or neck.  Symptoms may include weight gain, dry skin, constipation, feeling as though you do not have energy, and not being able to tolerate cold.  This condition is treated with medicine to replace the thyroid hormones that your body does not make. This information  is not intended to replace advice given to you by your health care provider. Make sure you discuss any questions you have with your health care provider. Document Released: 04/30/2005 Document Revised: 04/12/2017 Document Reviewed: 04/10/2017 Elsevier Patient Education  2020 [employer]  

## 2019-03-30 DIAGNOSIS — M5136 Other intervertebral disc degeneration, lumbar region: Secondary | ICD-10-CM | POA: Diagnosis not present

## 2019-03-30 DIAGNOSIS — M961 Postlaminectomy syndrome, not elsewhere classified: Secondary | ICD-10-CM | POA: Diagnosis not present

## 2019-03-30 DIAGNOSIS — G894 Chronic pain syndrome: Secondary | ICD-10-CM | POA: Diagnosis not present

## 2019-03-30 DIAGNOSIS — Z79891 Long term (current) use of opiate analgesic: Secondary | ICD-10-CM | POA: Diagnosis not present

## 2019-04-07 ENCOUNTER — Other Ambulatory Visit: Payer: Self-pay

## 2019-04-07 ENCOUNTER — Telehealth: Payer: Self-pay | Admitting: Family Medicine

## 2019-04-07 NOTE — Telephone Encounter (Signed)
Pt called to get results.  Copied from [place] [id number]. Topic: Quick Communication - Lab Results (Clinic Use ONLY) >> Mar 27, 2019  3:10 PM Marin Roberts, [place] wrote: Called patient to inform them of 03/23/19 lab results. When patient returns call, triage nurse may disclose results.

## 2019-04-08 ENCOUNTER — Other Ambulatory Visit: Payer: Self-pay

## 2019-04-08 ENCOUNTER — Other Ambulatory Visit: Payer: Self-pay | Admitting: Family Medicine

## 2019-04-08 DIAGNOSIS — E038 Other specified hypothyroidism: Secondary | ICD-10-CM

## 2019-04-08 MED ORDER — LEVOTHYROXINE SODIUM 100 MCG PO TABS: 100.0000 ug | ORAL_TABLET | Freq: Every day | ORAL | 0 refills | Status: DC

## 2019-04-08 NOTE — Telephone Encounter (Signed)
Medication Refill - Medication: levothyroxine (SYNTHROID) 88 MCG tablet    Preferred Pharmacy (with phone number or street name):  [id number] - [place], [place] - [address] [phone] (Phone) [phone] (Fax)     Agent: Please be advised that RX refills may take up to 3 business days. We ask that you follow-up with your pharmacy.

## 2019-04-08 NOTE — Telephone Encounter (Signed)
Requested medication (s) are due for refill today: Yes  Requested medication (s) are on the active medication list: Yes  Last refill:  12/17/18  Future visit scheduled: No  Notes to clinic:  Needs medication dose change - recent labs.    Requested Prescriptions  Pending Prescriptions Disp Refills   levothyroxine (SYNTHROID) 88 MCG tablet 90 tablet 0    Sig: TAKE 1 TABLET DAILY (NEED PHYSICAL AND LABS IN October FOR FURTHER REFILLS)     Endocrinology:  Hypothyroid Agents Failed - 04/08/2019  8:41 AM      Failed - TSH needs to be rechecked within 3 months after an abnormal result. Refill until TSH is due.      Failed - TSH in normal range and within 360 days    TSH  Date Value Ref Range Status  03/23/2019 17.40 (H) 0.35 - 4.50 uIU/mL Final         Passed - Valid encounter within last 12 months    Recent Outpatient Visits          2 weeks ago Other specified hypothyroidism   [facility], Alinda Sierras, MD   1 year ago Other specified hypothyroidism   [facility], Alinda Sierras, MD   1 year ago Hemoptysis   [facility] at [facility], Alinda Sierras, MD   1 year ago Allergic contact dermatitis due to plants, except food   [facility] at [facility], Alinda Sierras, MD   1 year ago [place] [facility], Alinda Sierras, MD

## 2019-04-18 ENCOUNTER — Other Ambulatory Visit: Payer: Self-pay | Admitting: Internal Medicine

## 2019-04-21 DIAGNOSIS — H61009 Unspecified perichondritis of external ear, unspecified ear: Secondary | ICD-10-CM | POA: Diagnosis not present

## 2019-04-21 DIAGNOSIS — L570 Actinic keratosis: Secondary | ICD-10-CM | POA: Diagnosis not present

## 2019-05-18 ENCOUNTER — Other Ambulatory Visit: Payer: Self-pay | Admitting: Obstetrics & Gynecology

## 2019-05-30 DIAGNOSIS — K1120 Sialoadenitis, unspecified: Secondary | ICD-10-CM | POA: Diagnosis not present

## 2019-06-01 ENCOUNTER — Telehealth: Payer: Self-pay | Admitting: *Deleted

## 2019-06-01 NOTE — Telephone Encounter (Signed)
Called patient and she stated that this happened Friday night and worse on Saturday. Patient went to and Urgent Care on Saturday and she was given a steroid injection and also antibiotics and patient thinks it was Amoxicillin. Patient will finish today. I have scheduled her a telephone visit because she is watching her granddaughter Tuesday and Wednesday and not able to come to the office this week. Sending as Cassidy Norton

## 2019-06-01 NOTE — Telephone Encounter (Signed)
Patient called after hours line. Patient reports she started having pain on her left side of her face, stated that her face is hot but does not have a temp at this time. States that she took some sinus medication and did not help. Pain in left side of face 8/10. Patient was advised to go to an UC. Please follow up with patient.

## 2019-06-02 ENCOUNTER — Telehealth (INDEPENDENT_AMBULATORY_CARE_PROVIDER_SITE_OTHER): Payer: Medicare Other | Admitting: Family Medicine

## 2019-06-02 ENCOUNTER — Other Ambulatory Visit: Payer: Self-pay

## 2019-06-02 DIAGNOSIS — K1121 Acute sialoadenitis: Secondary | ICD-10-CM

## 2019-06-02 DIAGNOSIS — R220 Localized swelling, mass and lump, head: Secondary | ICD-10-CM | POA: Diagnosis not present

## 2019-06-02 NOTE — Progress Notes (Signed)
This visit type was conducted due to national recommendations for restrictions regarding the COVID-19 pandemic in an effort to limit this patient's exposure and mitigate transmission in our community.   Virtual Visit via Telephone Note  I connected with [name] on 06/02/19 at  3:15 PM EST by telephone and verified that I am speaking with the correct person using two identifiers.   I discussed the limitations, risks, security and privacy concerns of performing an evaluation and management service by telephone and the availability of in person appointments. I also discussed with the patient that there may be a patient responsible charge related to this service. The patient expressed understanding and agreed to proceed.  Location patient: home Location provider: work or home office Participants present for the call: patient, provider Patient did not have a visit in the prior 7 days to address this/these issue(s).   History of Present Illness: Iline had onset of left facial pain and swelling last Friday morning.  She states this was around her "jaw "radiating toward the left ear.  She applied some heat but had progressive pain and swelling especially by Saturday morning.  She went to urgent care and apparently was given intramuscular steroids along with some type of intramuscular antibiotic and placed on Augmentin for 3 days.  She states she is much improved at this time.  Less swelling.  She did have some warmth of the face and possibly some mild erythema initially but that is improving.  She initially had some pain with chewing but none now.  Keeping down fluids well.  Denies any prior history of salivary duct stones   Observations/Objective: Patient sounds cheerful and well on the phone. I do not appreciate any SOB. Speech and thought processing are grossly intact. Patient reported vitals:  Assessment and Plan:  Recent left facial swelling.  By history, this sounded like acute  parotitis.  She seems to be clearly improving with recent antibiotics and steroid  -Stressed importance of adequate hydration -She will try some sialagogues to facilitate drainage and follow-up immediately for any increased redness, warmth, swelling after completion of antibiotic  Follow Up Instructions:  -Office follow-up for any persistent or worsening symptoms   [id number] 5-10 [id number] 11-20 [id number] 21-30 I did not refer this patient for an OV in the next 24 hours for this/these issue(s).  I discussed the assessment and treatment plan with the patient. The patient was provided an opportunity to ask questions and all were answered. The patient agreed with the plan and demonstrated an understanding of the instructions.   The patient was advised to call back or seek an in-person evaluation if the symptoms worsen or if the condition fails to improve as anticipated.  I provided 22 minutes of non-face-to-face time during this encounter.   Carolann Littler, MD

## 2019-06-05 ENCOUNTER — Telehealth: Payer: Self-pay | Admitting: *Deleted

## 2019-06-05 ENCOUNTER — Other Ambulatory Visit: Payer: Self-pay | Admitting: *Deleted

## 2019-06-05 DIAGNOSIS — K1121 Acute sialoadenitis: Secondary | ICD-10-CM

## 2019-06-05 MED ORDER — AMOXICILLIN-POT CLAVULANATE 875-125 MG PO TABS: 1.0000 | ORAL_TABLET | Freq: Two times a day (BID) | ORAL | 0 refills | Status: DC

## 2019-06-05 NOTE — Telephone Encounter (Signed)
Refill Augmentin 875 mg take 1 tablet twice daily with food #14

## 2019-06-05 NOTE — Telephone Encounter (Signed)
Patient called after hours line this morning. Patient reports she may need another round of antibiotics and the dr told her to call.   Patient was seen by PCP via telemedicine on  06/02/2019 and was seen at an urgent care before virtual visit with PCP.   Clinic RN spoke with patient. Per patient her job is swelling again and is painful. Patient reports she was not on oral antibiotics long enough. Please advise

## 2019-06-05 NOTE — Telephone Encounter (Signed)
Augmentin refilled. Called patient. No answer. LVM informing patient that Rx was sent to pharmacy.

## 2019-06-08 ENCOUNTER — Other Ambulatory Visit: Payer: Medicare Other

## 2019-06-11 ENCOUNTER — Other Ambulatory Visit: Payer: Self-pay

## 2019-06-11 ENCOUNTER — Other Ambulatory Visit (INDEPENDENT_AMBULATORY_CARE_PROVIDER_SITE_OTHER): Payer: Medicare Other

## 2019-06-11 DIAGNOSIS — E038 Other specified hypothyroidism: Secondary | ICD-10-CM | POA: Diagnosis not present

## 2019-06-11 LAB — TSH: TSH: 2.93 u[IU]/mL (ref 0.35–4.50)

## 2019-06-17 ENCOUNTER — Other Ambulatory Visit: Payer: Self-pay | Admitting: Family Medicine

## 2019-07-07 ENCOUNTER — Encounter: Payer: Self-pay | Admitting: Family Medicine

## 2019-07-07 ENCOUNTER — Ambulatory Visit: Payer: Medicare Other

## 2019-07-07 ENCOUNTER — Telehealth (INDEPENDENT_AMBULATORY_CARE_PROVIDER_SITE_OTHER): Payer: Medicare Other | Admitting: Family Medicine

## 2019-07-07 ENCOUNTER — Other Ambulatory Visit: Payer: Self-pay

## 2019-07-07 DIAGNOSIS — Z7189 Other specified counseling: Secondary | ICD-10-CM

## 2019-07-07 DIAGNOSIS — Z0000 Encounter for general adult medical examination without abnormal findings: Secondary | ICD-10-CM | POA: Diagnosis not present

## 2019-07-07 DIAGNOSIS — Z7185 Encounter for immunization safety counseling: Secondary | ICD-10-CM

## 2019-07-07 NOTE — Patient Instructions (Signed)
  Cassidy Norton , Thank you for taking time to come for your Medicare Wellness Visit. I appreciate your ongoing commitment to your health goals. Please review the following plan we discussed and let me know if I can assist you in the future.   These are the goals we discussed: Goals   -Please call your gynecology office to schedule your bone density exam - your mammogram will be due this summer -I hope that the COVID19 vaccine because available to you soon! - please see if you can get your cholesterol and diabetes screening labs with Dr. Elease Hashimoto the next time that you do labs   This is a list of the screening recommended for you and due dates:  Health Maintenance  Topic Date Due  . Mammogram  11/04/2019  . Colon Cancer Screening  06/12/2022  . Tetanus Vaccine  07/25/2022  . Flu Shot  Completed  . DEXA scan (bone density measurement)  Completed  .  Hepatitis C: One time screening is recommended by Center for Disease Control  (CDC) for  adults born from [birth year] through 1967.   Completed  . Pneumonia vaccines  Completed

## 2019-07-07 NOTE — Progress Notes (Signed)
Medicare Annual Preventive Care Visit  (initial annual wellness or annual wellness exam)  Virtual Visit via Video Note  I connected with Cassidy Norton by a video enabled telemedicine application and verified that I am speaking with the correct person using two identifiers. She was not able to connect via video so the visit was completed via phone. Spent 40 minutes on this visit.  Location patient: home Location provider:work or home office Persons participating in the virtual visit: patient, provider, husband  Concerns and/or follow up today:  Cassidy Norton sees PCP, Dr. Elease Hashimoto and has PMH significant for GERD, Hypothyroidism, B12 Def and Osteopenia. She is due for a pap smear, DEXA, Lipids?. She sees a gynecologist for women's health care - Dr. Dellis Filbert. Reports did pap smear 2 years ago. Reports does DEXA scans with gyn - and is due. I do not prescribe opiods for this patient. She rarely takes hydrocodone - maybe every 6 months or so. Reports she gets on stationary on rainy days and tries to get outside to walk on good days. Reports ears healthy.  IS trying to get her COVID19 vaccine, but so far has not been able to schedule due to limited appointment slots.  See HM section in Epic for other details of completed HM. See scanned documentation under Media Tab for further documentation HPI, health risk assessment. See Media Tab and Care Teams sections in Epic for other providers.  ROS: negative for report of fevers, unintentional weight loss, vision changes, vision loss, hearing loss or change, chest pain, sob, hemoptysis, melena, hematochezia, hematuria, genital discharge or lesions, falls, bleeding or bruising, loc, thoughts of suicide or self harm, memory loss  1.) Patient-completed health risk assessment  - completed and reviewed, see scanned documentation  2.) Review of Medical History: -PMH, PSH, Family History and current specialty and care providers reviewed and updated and listed below  - see  scanned in document in chart and below Patient Care Team: Eulas Post, MD as PCP - General Princess Bruins, MD as Consulting Physician (Obstetrics and Gynecology) Monna Fam, MD as Consulting Physician (Ophthalmology) Suella Broad, MD as Consulting Physician (Physical Medicine and Rehabilitation)      Past Medical History:  Diagnosis Date  . Abdominal pain, epigastric 08/22/2009  . Adenomatous colon polyp   . Chronic low back pain   . Diverticulosis   . Duodenal ulcer   . GERD (gastroesophageal reflux disease)   . Hemorrhoids   . Hiatal hernia   . HYPOTHYROIDISM 07/20/2008  . Insomnia   . NSVD (normal spontaneous vaginal delivery)    X2    Past Surgical History:  Procedure Laterality Date  . ABDOMINAL HYSTERECTOMY     TVH  . BACK SURGERY     RUPTURED DISC  . BLEPHAROPLASTY    . CHOLECYSTECTOMY    . COLONOSCOPY  JAN 2014   NEXT COLONOSCOPY IN 10 YEARS  . DILATION AND CURETTAGE OF UTERUS    . EYE SURGERY  2008   BILATERAL CATARACT     Social History   Socioeconomic History  . Marital status: Married    Spouse name: Not on file  . Number of children: 1  . Years of education: Not on file  . Highest education level: Not on file  Occupational History  . Occupation: Retired    Fish farm manager: RETIRED  Tobacco Use  . Smoking status: Former Smoker    Packs/day: 1.50    Years: 40.00    Pack years: 60.00    Types: Cigarettes  Quit date: 08/28/2003    Years since quitting: 15.8  . Smokeless tobacco: Never Used  Substance and Sexual Activity  . Alcohol use: No    Alcohol/week: 0.0 standard drinks  . Drug use: No  . Sexual activity: Yes    Birth control/protection: Surgical    Comment: HYSTERECTOMY  Other Topics Concern  . Not on file  Social History Narrative  . Not on file   Social Determinants of Health   Financial Resource Strain:   . Difficulty of Paying Living Expenses: Not on file  Food Insecurity:   . Worried About Charity fundraiser  in the Last Year: Not on file  . Ran Out of Food in the Last Year: Not on file  Transportation Needs:   . Lack of Transportation (Medical): Not on file  . Lack of Transportation (Non-Medical): Not on file  Physical Activity:   . Days of Exercise per Week: Not on file  . Minutes of Exercise per Session: Not on file  Stress:   . Feeling of Stress : Not on file  Social Connections:   . Frequency of Communication with Friends and Family: Not on file  . Frequency of Social Gatherings with Friends and Family: Not on file  . Attends Religious Services: Not on file  . Active Member of Clubs or Organizations: Not on file  . Attends Archivist Meetings: Not on file  . Marital Status: Not on file  Intimate Partner Violence:   . Fear of Current or Ex-Partner: Not on file  . Emotionally Abused: Not on file  . Physically Abused: Not on file  . Sexually Abused: Not on file    Family History  Problem Relation Age of Onset  . Cancer Mother        bladder CA  . Diabetes Mother   . Cancer Father        gallblader CA  . Heart disease Father   . Diabetes Sister   . Hypertension Sister   . Diabetes Brother   . Hypertension Brother   . Heart disease Brother   . Heart disease Daughter   . Stomach cancer Neg Hx   . Colon cancer Neg Hx     Current Outpatient Medications on File Prior to Visit  Medication Sig Dispense Refill  . acetaminophen (TYLENOL) 650 MG CR tablet Reported on 09/13/2015 4 TABS DAILY    . cycloSPORINE (RESTASIS) 0.05 % ophthalmic emulsion Place 1 drop into the left eye 2 (two) times daily.    Marland Kitchen estradiol (ESTRACE) 0.5 MG tablet TAKE ONE-HALF (1/2) TABLET EVERY OTHER DAY 25 tablet 3  . HYDROcodone-acetaminophen (NORCO) 10-325 MG per tablet Take 0.5 tablets by mouth every 6 (six) hours as needed.     Marland Kitchen levothyroxine (SYNTHROID) 100 MCG tablet TAKE 1 TABLET BY MOUTH EVERY DAY 90 tablet 1  . levothyroxine (SYNTHROID) 88 MCG tablet TAKE 1 TABLET DAILY (NEED PHYSICAL AND  LABS IN October FOR FURTHER REFILLS) 90 tablet 0  . Multiple Vitamins-Minerals (PRESERVISION AREDS 2 PO) Take 2 tablets by mouth 2 (two) times daily.     Marland Kitchen omeprazole (PRILOSEC) 40 MG capsule TAKE 1 CAPSULE TWICE A DAY 180 capsule 0  . polyethylene glycol (MIRALAX / GLYCOLAX) packet Take 17 g by mouth daily as needed.     . pregabalin (LYRICA) 150 MG capsule Take 1 capsule (150 mg total) by mouth 2 (two) times daily. 180 capsule 3  . Propylene Glycol (SYSTANE COMPLETE OP) Apply to eye.    Marland Kitchen  Psyllium (METAMUCIL) 30.9 % POWD Take by mouth. One tsp daily at bedtime     . sucralfate (CARAFATE) 1 g tablet TAKE 1 TABLET FOUR TIMES A DAY WITH MEALS AND AT BEDTIME 360 tablet 3  . vitamin B-12 (CYANOCOBALAMIN) 1000 MCG tablet Take 1,000 mcg by mouth daily.     No current facility-administered medications on file prior to visit.     3.) Review of functional ability and level of safety:  Any difficulty hearing?  See scanned documentation  History of falling?  See scanned documentation  Any trouble with IADLs - using a phone, using transportation, grocery shopping, preparing meals, doing housework, doing laundry, taking medications and managing money?  See scanned documentation  Advance Directives? Yes  Discussed briefly.   See summary of recommendations in Patient Instructions below.  4.) Physical Exam There were no vitals filed for this visit. Estimated body mass index is 28.17 kg/m as calculated from the following:   Height as of 03/23/19: 5\' 2"  (1.575 m).   Weight as of 03/23/19: 154 lb (69.9 kg).  EKG (optional): deferred  VITALS per patient if applicable:  GENERAL: alert, oriented, appears well and in no acute distress; visual acuity grossly intact, full vision exam deferred due to pandemic and/or virtual encounter  HEENT: atraumatic, conjunttiva clear, no obvious abnormalities on inspection of external nose and ears  NECK: normal movements of the head and neck  LUNGS: on  inspection no signs of respiratory distress, breathing rate appears normal, no obvious gross SOB, gasping or wheezing  CV: no obvious cyanosis  MS: moves all visible extremities without noticeable abnormality  PSYCH/NEURO: pleasant and cooperative, no obvious depression or anxiety, speech and thought processing grossly intact, Cognitive function grossly intact  Depression screen [facility] 2/9 07/07/2019 02/12/2018 09/03/2016 04/12/2015 10/13/2014  Decreased Interest 0 0 0 0 0  Down, Depressed, Hopeless 0 0 0 0 0  PHQ - 2 Score 0 0 0 0 0     See patient instructions for recommendations.  Education and counseling regarding the above review of health provided with a plan for the following: -see scanned patient completed form for further details -fall prevention strategies discussed  -healthy lifestyle discussed -importance and resources for completing advanced directives discussed -see patient instructions below for any other recommendations provided  4)The following written screening schedule of preventive measures were reviewed with assessment and plan made per below, orders and patient instructions:       Alcohol screening done     Obesity Screening and counseling done     STI screening (Hep C if born 48-65) offered and per pt wishes     Tobacco Screening done        Pneumococcal (PPSV23 -one dose after 64, one before if risk factors), influenza yearly and hepatitis B vaccines (if high risk - end stage renal disease, IV drugs, homosexual men, live in home for mentally retarded, hemophilia receiving factors) ASSESSMENT/PLAN: done if applicable      Screening mammograph (yearly if >40) ASSESSMENT/PLAN: utd, due this summer per Epic HM section      Screening Pap smear/pelvic exam (q2 years) ASSESSMENT/PLAN: n/a, declined - does with gyn and reports did 2 years ago      Colorectal cancer screening (FOBT yearly or flex sig q4y or colonoscopy q10y or barium enema q4y) ASSESSMENT/PLAN: utd  per Epic HM      Diabetes outpatient self-management training services ASSESSMENT/PLAN: utd or done      Bone mass measurements(covered q2y if indicated -  estrogen def, osteoporosis, hyperparathyroid, vertebral abnormalities, osteoporosis or steroids) ASSESSMENT/PLAN: due, last done in 2014 per Epic; but she reports does with gyn and she reports she will call there to schedule      Screening for glaucoma(q1y if high risk - diabetes, FH, AA and > 50 or hispanic and > 65) ASSESSMENT/PLAN: utd or advised      Medical nutritional therapy for individuals with diabetes or renal disease ASSESSMENT/PLAN: see orders      Cardiovascular screening blood tests (lipids q5y) ASSESSMENT/PLAN: see orders and labs      Diabetes screening tests ASSESSMENT/PLAN: see orders and labs   7.) Summary:   Medicare annual wellness visit, subsequent  Vaccine counseling  -risk factors and conditions per above assessment were discussed and treatment, recommendations and referrals were offered per documentation above and orders and patient instructions. -answered ?s regarding COVID19 vaccine access per [place] websites.  -advised healthy diet and regular exercise -advised due for DEXA - she reports she does these at her gyn office and she agrees to call there to schedule -she uses opioid pain medication from specialist ver very rarely. Discussed risks.  -she has not had lipid or diabetes screening in some time - she would prefer to do when she sees PCP for thyroid check rather than coming for separate visit. Will send this note to PCP so he is aware.   There are no Patient Instructions on file for this visit.  [name], DO

## 2019-07-07 NOTE — Progress Notes (Signed)
Thanks for seeing her

## 2019-07-20 ENCOUNTER — Other Ambulatory Visit: Payer: Self-pay | Admitting: Internal Medicine

## 2019-08-04 DIAGNOSIS — S91312A Laceration without foreign body, left foot, initial encounter: Secondary | ICD-10-CM | POA: Diagnosis not present

## 2019-08-04 DIAGNOSIS — Z23 Encounter for immunization: Secondary | ICD-10-CM | POA: Diagnosis not present

## 2019-08-06 DIAGNOSIS — M545 Low back pain: Secondary | ICD-10-CM | POA: Diagnosis not present

## 2019-08-06 DIAGNOSIS — Z79891 Long term (current) use of opiate analgesic: Secondary | ICD-10-CM | POA: Diagnosis not present

## 2019-08-06 DIAGNOSIS — M5136 Other intervertebral disc degeneration, lumbar region: Secondary | ICD-10-CM | POA: Diagnosis not present

## 2019-08-06 DIAGNOSIS — G894 Chronic pain syndrome: Secondary | ICD-10-CM | POA: Diagnosis not present

## 2019-08-06 DIAGNOSIS — M961 Postlaminectomy syndrome, not elsewhere classified: Secondary | ICD-10-CM | POA: Diagnosis not present

## 2019-08-06 DIAGNOSIS — R52 Pain, unspecified: Secondary | ICD-10-CM | POA: Diagnosis not present

## 2019-08-13 DIAGNOSIS — S91312A Laceration without foreign body, left foot, initial encounter: Secondary | ICD-10-CM | POA: Diagnosis not present

## 2019-08-20 DIAGNOSIS — S91319A Laceration without foreign body, unspecified foot, initial encounter: Secondary | ICD-10-CM | POA: Diagnosis not present

## 2019-09-08 DIAGNOSIS — M79642 Pain in left hand: Secondary | ICD-10-CM | POA: Diagnosis not present

## 2019-09-24 DIAGNOSIS — I700 Atherosclerosis of aorta: Secondary | ICD-10-CM | POA: Diagnosis not present

## 2019-09-24 DIAGNOSIS — M533 Sacrococcygeal disorders, not elsewhere classified: Secondary | ICD-10-CM | POA: Diagnosis not present

## 2019-09-24 DIAGNOSIS — M545 Low back pain: Secondary | ICD-10-CM | POA: Diagnosis not present

## 2019-09-24 DIAGNOSIS — G894 Chronic pain syndrome: Secondary | ICD-10-CM | POA: Diagnosis not present

## 2019-09-24 DIAGNOSIS — M5136 Other intervertebral disc degeneration, lumbar region: Secondary | ICD-10-CM | POA: Diagnosis not present

## 2019-10-02 DIAGNOSIS — M545 Low back pain: Secondary | ICD-10-CM | POA: Diagnosis not present

## 2019-10-02 DIAGNOSIS — M533 Sacrococcygeal disorders, not elsewhere classified: Secondary | ICD-10-CM | POA: Diagnosis not present

## 2019-10-06 ENCOUNTER — Other Ambulatory Visit (HOSPITAL_COMMUNITY): Payer: Self-pay | Admitting: Chiropractic Medicine

## 2019-10-06 DIAGNOSIS — I7789 Other specified disorders of arteries and arterioles: Secondary | ICD-10-CM

## 2019-10-15 DIAGNOSIS — M25519 Pain in unspecified shoulder: Secondary | ICD-10-CM | POA: Diagnosis not present

## 2019-10-15 DIAGNOSIS — G894 Chronic pain syndrome: Secondary | ICD-10-CM | POA: Diagnosis not present

## 2019-10-15 DIAGNOSIS — M961 Postlaminectomy syndrome, not elsewhere classified: Secondary | ICD-10-CM | POA: Diagnosis not present

## 2019-10-15 DIAGNOSIS — Z79891 Long term (current) use of opiate analgesic: Secondary | ICD-10-CM | POA: Diagnosis not present

## 2019-10-15 DIAGNOSIS — M5136 Other intervertebral disc degeneration, lumbar region: Secondary | ICD-10-CM | POA: Diagnosis not present

## 2019-10-18 ENCOUNTER — Other Ambulatory Visit: Payer: Self-pay | Admitting: Internal Medicine

## 2019-10-19 ENCOUNTER — Telehealth: Payer: Self-pay | Admitting: *Deleted

## 2019-10-19 MED ORDER — PREGABALIN 150 MG PO CAPS: 150.0000 mg | ORAL_CAPSULE | Freq: Two times a day (BID) | ORAL | 3 refills | Status: DC

## 2019-10-19 NOTE — Telephone Encounter (Signed)
I spoke with Rudi Rummage - [facility] [id number], she states pt has Lyrica until end of June, then 90 day refill. I had put the Lyrica rx in for pt and the Lyrica would not show up on [facility] orders but only show up as a printed rx, and it did not go to a printer. Pt is due refills of Lyrica at the end of September and she will need to have the pharmacy send a refill request.

## 2019-10-19 NOTE — Telephone Encounter (Signed)
I spoke with pt and informed of the Lyrica rx to end of June and then + another 90 day refill and to have the pharmacy send a refill fax request.

## 2019-10-19 NOTE — Telephone Encounter (Signed)
Pt request refills of Lyrica to be called to [facility].

## 2019-10-20 ENCOUNTER — Ambulatory Visit (HOSPITAL_COMMUNITY)
Admission: RE | Admit: 2019-10-20 | Discharge: 2019-10-20 | Disposition: A | Discharge status: Home / Self Care | Payer: Medicare Other | Source: Ambulatory Visit | Attending: Cardiovascular Disease | Admitting: Cardiovascular Disease

## 2019-10-20 ENCOUNTER — Other Ambulatory Visit: Payer: Self-pay

## 2019-10-20 DIAGNOSIS — I700 Atherosclerosis of aorta: Secondary | ICD-10-CM | POA: Diagnosis not present

## 2019-10-20 DIAGNOSIS — M7541 Impingement syndrome of right shoulder: Secondary | ICD-10-CM | POA: Diagnosis not present

## 2019-10-20 DIAGNOSIS — M25511 Pain in right shoulder: Secondary | ICD-10-CM | POA: Diagnosis not present

## 2019-10-20 DIAGNOSIS — I7789 Other specified disorders of arteries and arterioles: Secondary | ICD-10-CM | POA: Diagnosis not present

## 2019-10-21 ENCOUNTER — Telehealth: Payer: Self-pay | Admitting: Podiatry

## 2019-10-21 ENCOUNTER — Other Ambulatory Visit: Payer: Self-pay | Admitting: Podiatry

## 2019-10-21 MED ORDER — PREGABALIN 150 MG PO CAPS: 150.0000 mg | ORAL_CAPSULE | Freq: Two times a day (BID) | ORAL | 3 refills | Status: DC

## 2019-10-21 NOTE — Telephone Encounter (Signed)
Sent to wrong pharm initially.  [facility] to [facility].

## 2019-10-21 NOTE — Telephone Encounter (Signed)
Patient called to say that [facility] has not received the faxed Rx for patients Pregabalin (Lyrica) 150 mg she takes for her neuropathy.   Can call in Rx to [facility] at phone number of [phone].

## 2019-10-21 NOTE — Telephone Encounter (Signed)
Please advise 

## 2019-10-21 NOTE — Telephone Encounter (Signed)
Please disregard

## 2019-10-29 NOTE — Telephone Encounter (Signed)
Pt called and the medication has not been sent to [facility] for the pregamblin please advise

## 2019-11-02 ENCOUNTER — Telehealth: Payer: Self-pay | Admitting: Podiatry

## 2019-11-02 MED ORDER — PREGABALIN 150 MG PO CAPS: 150.0000 mg | ORAL_CAPSULE | Freq: Two times a day (BID) | ORAL | 3 refills | Status: DC

## 2019-11-02 NOTE — Telephone Encounter (Signed)
Pt called and her medication has not yet been sent to her pharmacy [facility] fax#[phone] telephone# [phone] she is following up on medication refill please assist

## 2019-11-02 NOTE — Telephone Encounter (Signed)
Refilled electronically to [facility].

## 2019-11-02 NOTE — Addendum Note (Signed)
Addended by: Harriett Sine D on: 11/02/2019 09:13 AM   Modules accepted: Orders

## 2019-11-05 ENCOUNTER — Telehealth: Payer: Self-pay | Admitting: *Deleted

## 2019-11-09 ENCOUNTER — Encounter: Payer: Self-pay | Admitting: Obstetrics & Gynecology

## 2019-11-09 DIAGNOSIS — Z1231 Encounter for screening mammogram for malignant neoplasm of breast: Secondary | ICD-10-CM | POA: Diagnosis not present

## 2019-11-09 NOTE — Telephone Encounter (Signed)
Patient stated the mail order pharmacy did not receive it.

## 2019-11-09 NOTE — Telephone Encounter (Signed)
Cassidy Norton refilled this medication on 6/21

## 2019-11-09 NOTE — Telephone Encounter (Signed)
Patient called saying that her mail order pharmacy still has not received an authorization for refill request of Pregabalin. Please re-submit as last one was "printed". Patient is wanting to know if we can send in a week supply [facility] [place] until patient can get her mail order. Patient stated she will run out of medication this week. Please advise.

## 2019-11-10 NOTE — Telephone Encounter (Signed)
Faxed required new prescription form to [facility].

## 2019-11-11 ENCOUNTER — Telehealth: Payer: Self-pay | Admitting: Podiatry

## 2019-11-11 MED ORDER — PREGABALIN 150 MG PO CAPS
150.0000 mg | ORAL_CAPSULE | Freq: Two times a day (BID) | ORAL | 0 refills | Status: DC
Start: 2019-11-11 — End: 2020-02-11

## 2019-11-11 NOTE — Telephone Encounter (Signed)
Pt called stating that she will not get her medication from the mail order until July 6th pt stated she will run out before then pt wanted to know if she could have a small amopunt sent to local pharmacy in [place] to help until she gets her mail order 11/17/19

## 2019-11-11 NOTE — Addendum Note (Signed)
Addended by: Clovis Riley E on: 11/11/2019 01:06 PM   Modules accepted: Orders

## 2019-11-12 NOTE — Telephone Encounter (Signed)
Pt called asking for a week supply of medication to be sent to local pharmacy [facility] [place] until mail order delivers on 11/17/19 please assist

## 2019-11-12 NOTE — Telephone Encounter (Signed)
Val check on this please it was sent in yesterday to [facility] [place]

## 2019-11-13 NOTE — Telephone Encounter (Signed)
Pt states [facility] received the order but she will run out prior to receiving the Lyrica. Dr. Milinda Pointer ordered #14 Lyrica 150mg  to get her to the arrival of the full rx. I left message with Dr. Milinda Pointer 11/11/2019 Lyrica rx at [place] [address].

## 2019-12-03 DIAGNOSIS — M961 Postlaminectomy syndrome, not elsewhere classified: Secondary | ICD-10-CM | POA: Diagnosis not present

## 2019-12-03 DIAGNOSIS — Z79891 Long term (current) use of opiate analgesic: Secondary | ICD-10-CM | POA: Diagnosis not present

## 2019-12-03 DIAGNOSIS — M533 Sacrococcygeal disorders, not elsewhere classified: Secondary | ICD-10-CM | POA: Diagnosis not present

## 2019-12-03 DIAGNOSIS — M25561 Pain in right knee: Secondary | ICD-10-CM | POA: Diagnosis not present

## 2019-12-03 DIAGNOSIS — M25551 Pain in right hip: Secondary | ICD-10-CM | POA: Diagnosis not present

## 2019-12-03 DIAGNOSIS — G894 Chronic pain syndrome: Secondary | ICD-10-CM | POA: Diagnosis not present

## 2019-12-03 DIAGNOSIS — M5136 Other intervertebral disc degeneration, lumbar region: Secondary | ICD-10-CM | POA: Diagnosis not present

## 2019-12-07 DIAGNOSIS — H04123 Dry eye syndrome of bilateral lacrimal glands: Secondary | ICD-10-CM | POA: Diagnosis not present

## 2019-12-07 DIAGNOSIS — H35033 Hypertensive retinopathy, bilateral: Secondary | ICD-10-CM | POA: Diagnosis not present

## 2019-12-07 DIAGNOSIS — H353131 Nonexudative age-related macular degeneration, bilateral, early dry stage: Secondary | ICD-10-CM | POA: Diagnosis not present

## 2019-12-07 DIAGNOSIS — Z961 Presence of intraocular lens: Secondary | ICD-10-CM | POA: Diagnosis not present

## 2019-12-10 ENCOUNTER — Other Ambulatory Visit: Payer: Self-pay | Admitting: Family Medicine

## 2019-12-10 ENCOUNTER — Other Ambulatory Visit: Payer: Self-pay | Admitting: Internal Medicine

## 2019-12-17 ENCOUNTER — Other Ambulatory Visit: Payer: Self-pay | Admitting: Internal Medicine

## 2019-12-17 MED ORDER — SUCRALFATE 1 G PO TABS: 1.0000 g | ORAL_TABLET | Freq: Three times a day (TID) | ORAL | 0 refills | Status: DC

## 2019-12-17 NOTE — Telephone Encounter (Signed)
Patient has an appt in Sept. Refill sent.

## 2019-12-17 NOTE — Telephone Encounter (Signed)
Patient called requesting refill on Carafate

## 2019-12-31 ENCOUNTER — Other Ambulatory Visit: Payer: Self-pay | Admitting: Obstetrics & Gynecology

## 2019-12-31 NOTE — Telephone Encounter (Signed)
Message sent to [facility] to please call patient and arrange CE since she was due last month.

## 2020-01-07 DIAGNOSIS — G894 Chronic pain syndrome: Secondary | ICD-10-CM | POA: Diagnosis not present

## 2020-01-08 ENCOUNTER — Other Ambulatory Visit: Payer: Self-pay | Admitting: Family Medicine

## 2020-01-10 ENCOUNTER — Other Ambulatory Visit: Payer: Self-pay | Admitting: Internal Medicine

## 2020-01-16 ENCOUNTER — Other Ambulatory Visit: Payer: Self-pay | Admitting: Internal Medicine

## 2020-01-25 ENCOUNTER — Ambulatory Visit: Payer: Medicare Other | Admitting: Nurse Practitioner

## 2020-01-27 DIAGNOSIS — M25511 Pain in right shoulder: Secondary | ICD-10-CM | POA: Diagnosis not present

## 2020-02-04 ENCOUNTER — Other Ambulatory Visit: Payer: Self-pay

## 2020-02-04 ENCOUNTER — Ambulatory Visit (INDEPENDENT_AMBULATORY_CARE_PROVIDER_SITE_OTHER): Payer: Medicare Other | Admitting: *Deleted

## 2020-02-04 DIAGNOSIS — Z23 Encounter for immunization: Secondary | ICD-10-CM | POA: Diagnosis not present

## 2020-02-05 ENCOUNTER — Other Ambulatory Visit: Payer: Self-pay

## 2020-02-05 MED ORDER — OMEPRAZOLE 40 MG PO CPDR: 40.0000 mg | DELAYED_RELEASE_CAPSULE | Freq: Two times a day (BID) | ORAL | 0 refills | Status: DC

## 2020-02-08 ENCOUNTER — Telehealth: Payer: Self-pay | Admitting: Internal Medicine

## 2020-02-08 MED ORDER — SUCRALFATE 1 G PO TABS: 1.0000 g | ORAL_TABLET | Freq: Three times a day (TID) | ORAL | 1 refills | Status: DC

## 2020-02-08 NOTE — Telephone Encounter (Signed)
Res[facility]

## 2020-02-11 ENCOUNTER — Encounter: Payer: Self-pay | Admitting: Physician Assistant

## 2020-02-11 ENCOUNTER — Telehealth: Payer: Self-pay | Admitting: Family Medicine

## 2020-02-11 ENCOUNTER — Ambulatory Visit (INDEPENDENT_AMBULATORY_CARE_PROVIDER_SITE_OTHER): Payer: Medicare Other | Admitting: Physician Assistant

## 2020-02-11 VITALS — BP 120/62 | HR 75 | Ht 62.0 in | Wt 158.0 lb

## 2020-02-11 DIAGNOSIS — K2970 Gastritis, unspecified, without bleeding: Secondary | ICD-10-CM | POA: Diagnosis not present

## 2020-02-11 DIAGNOSIS — K219 Gastro-esophageal reflux disease without esophagitis: Secondary | ICD-10-CM | POA: Diagnosis not present

## 2020-02-11 DIAGNOSIS — R1013 Epigastric pain: Secondary | ICD-10-CM | POA: Diagnosis not present

## 2020-02-11 MED ORDER — SUCRALFATE 1 G PO TABS: 1.0000 g | ORAL_TABLET | Freq: Three times a day (TID) | ORAL | 3 refills | Status: DC

## 2020-02-11 MED ORDER — OMEPRAZOLE 40 MG PO CPDR: 40.0000 mg | DELAYED_RELEASE_CAPSULE | Freq: Two times a day (BID) | ORAL | 3 refills | Status: DC

## 2020-02-11 NOTE — Progress Notes (Signed)
Subjective:    [name], female    DOB: 01-11-1946, 74 y.o.   MRN: [id number]  HPI Cassidy Norton is a pleasant 74 year old white female, established with Dr. Henrene Pastor who comes in today for routine follow-up and med refills.  She was last seen in July 2019.  She has history of chronic GERD and prior history of duodenal ulcer felt to be NSAID related.  Also with history of adenomatous colon polyps. Last EGD May 2018 with small hiatal hernia, and two nonbleeding superficial duodenal ulcers, the largest 4 mm CLOtest was negative.   Last colonoscopy 2014 done for history of adenomatous colon polyp showed severe pandiverticulosis, no polyps. She has been maintained on omeprazole 40 mg p.o. twice daily and generally takes Carafate twice daily for chronic GERD and dyspeptic symptoms.  She has been out of Carafate over the past few weeks and has noticed some recurrent intermittent epigastric burning.  She is no longer taking any anti-inflammatories over the past couple of years, says that she has been using Tylenol for arthritic pain.  Appetite has been good, no active reflux symptoms, no dysphagia.  No complaints of nausea or vomiting.  No melena or hematochezia .  Review of Systems Pertinent positive and negative review of systems were noted in the above HPI section.  All other review of systems was otherwise negative.  Outpatient Encounter Medications as of 02/11/2020  Medication Sig  . acetaminophen (TYLENOL) 650 MG CR tablet Take 1,300 mg by mouth daily.   . cycloSPORINE (RESTASIS) 0.05 % ophthalmic emulsion Place 1 drop into the left eye 2 (two) times daily.  Marland Kitchen estradiol (ESTRACE) 0.5 MG tablet TAKE 1/2 TABLET OF TABLET EVERY OTHER DAY  . HYDROcodone-acetaminophen (NORCO) 10-325 MG per tablet Take 0.5 tablets by mouth every 6 (six) hours as needed.   Marland Kitchen levothyroxine (SYNTHROID) 100 MCG tablet TAKE 1 TABLET BY MOUTH EVERY DAY  . Multiple Vitamins-Minerals (PRESERVISION AREDS 2 PO) Take 1  tablet by mouth 2 (two) times daily.   Marland Kitchen omeprazole (PRILOSEC) 40 MG capsule Take 1 capsule (40 mg total) by mouth 2 (two) times daily. Must keep upcoming office visit for further refills  . polyethylene glycol (MIRALAX / GLYCOLAX) packet Take 17 g by mouth daily as needed.   . pregabalin (LYRICA) 150 MG capsule Take 1 capsule (150 mg total) by mouth 2 (two) times daily.  Marland Kitchen Propylene Glycol (SYSTANE COMPLETE OP) Apply to eye at bedtime.   . Psyllium (METAMUCIL) 30.9 % POWD Take by mouth. One tsp daily at bedtime   . sucralfate (CARAFATE) 1 g tablet Take 1 tablet (1 g total) by mouth 4 (four) times daily -  with meals and at bedtime.  . vitamin B-12 (CYANOCOBALAMIN) 1000 MCG tablet Take 1,000 mcg by mouth daily.  . [DISCONTINUED] omeprazole (PRILOSEC) 40 MG capsule Take 1 capsule (40 mg total) by mouth 2 (two) times daily. Must keep upcoming office visit for further refills  . [DISCONTINUED] sucralfate (CARAFATE) 1 g tablet Take 1 tablet (1 g total) by mouth 4 (four) times daily -  with meals and at bedtime. Please keep your appt in Sept for further refills. Thank you.  . [DISCONTINUED] levothyroxine (SYNTHROID) 88 MCG tablet TAKE 1 TABLET DAILY (NEED PHYSICAL AND LABS IN October FOR FURTHER REFILLS)  . [DISCONTINUED] pregabalin (LYRICA) 150 MG capsule Take 1 capsule (150 mg total) by mouth 2 (two) times daily.   No facility-administered encounter medications on file as of 02/11/2020.   Allergies  Allergen Reactions  . Aspirin     REACTION: Hives   Patient Active Problem List   Diagnosis Date Noted  . Peripheral neuropathy 03/23/2019  . B12 deficiency 02/12/2018  . GERD (gastroesophageal reflux disease) 11/06/2017  . Osteopenia 09/13/2015  . Ovarian cyst, right 08/16/2014  . Sore throat 07/27/2013  . NSAID long-term use 07/27/2013  . Acute upper respiratory infections of unspecified site 07/23/2013  . Vertigo 07/23/2013  . Vaginal atrophy 08/07/2012  . Weight gain 07/24/2012  .  Postmenopausal HRT (hormone replacement therapy) 07/24/2012  . Abdominal pain, epigastric 08/22/2009  . Hypothyroidism 07/20/2008   Social History   Socioeconomic History  . Marital status: Married    Spouse name: Not on file  . Number of children: 1  . Years of education: Not on file  . Highest education level: Not on file  Occupational History  . Occupation: Retired    Fish farm manager: RETIRED  Tobacco Use  . Smoking status: Former Smoker    Packs/day: 1.50    Years: 40.00    Pack years: 60.00    Types: Cigarettes    Quit date: 08/28/2003    Years since quitting: 16.4  . Smokeless tobacco: Never Used  Vaping Use  . Vaping Use: Never used  Substance and Sexual Activity  . Alcohol use: No    Alcohol/week: 0.0 standard drinks  . Drug use: No  . Sexual activity: Yes    Birth control/protection: Surgical    Comment: HYSTERECTOMY  Other Topics Concern  . Not on file  Social History Narrative  . Not on file   Social Determinants of Health   Financial Resource Strain:   . Difficulty of Paying Living Expenses: Not on file  Food Insecurity:   . Worried About Charity fundraiser in the Last Year: Not on file  . Ran Out of Food in the Last Year: Not on file  Transportation Needs:   . Lack of Transportation (Medical): Not on file  . Lack of Transportation (Non-Medical): Not on file  Physical Activity:   . Days of Exercise per Week: Not on file  . Minutes of Exercise per Session: Not on file  Stress:   . Feeling of Stress : Not on file  Social Connections:   . Frequency of Communication with Friends and Family: Not on file  . Frequency of Social Gatherings with Friends and Family: Not on file  . Attends Religious Services: Not on file  . Active Member of Clubs or Organizations: Not on file  . Attends Archivist Meetings: Not on file  . Marital Status: Not on file  Intimate Partner Violence:   . Fear of Current or Ex-Partner: Not on file  . Emotionally Abused: Not  on file  . Physically Abused: Not on file  . Sexually Abused: Not on file    Cassidy Norton's family history includes Cancer in her father and mother; Diabetes in her brother, mother, and sister; Heart disease in her brother, daughter, and father; Hypertension in her brother and sister.      Objective:    Vitals:   02/11/20 0922  BP: 120/62  Pulse: 75  SpO2: 99%    Physical Exam Well-developed well-nourished older white female in no acute distress.  Height, Weight, 158 BMI 28.9R nine  HEENT; nontraumatic normocephalic, EOMI, PE R LA, sclera anicteric. Oropharynx; not examined Neck; supple, no JVD Cardiovascular; regular rate and rhythm with S1-S2, no murmur rub or gallop Pulmonary; Clear bilaterally Abdomen; soft, nontender,  nondistended, no palpable mass or hepatosplenomegaly, bowel sounds are active Rectal; not done today Skin; benign exam, no jaundice rash or appreciable lesions Extremities; no clubbing cyanosis or edema skin warm and dry Neuro/Psych; alert and oriented x4, grossly nonfocal mood and affect appropriate       Assessment & Plan:   #62  74 year old white female with chronic GERD-stable on twice daily omeprazole #2 chronic dyspepsia-relieved with Carafate twice daily #3 history of NSAID related duodenal ulcers 2018-no current NSAID use #4 pandiverticulosis #5.  History of adenomatous colon polyps-up-to-date with colon cancer surveillance-negative colonoscopy 2014 and recommended for 10-year interval follow-up.  Plan; refill omeprazole 40 mg p.o. twice daily x1 year Refill Carafate 1 g p.o. twice daily, may take 4 times daily as needed, refill x1 year. Continue antireflux regimen Patient will follow up with Dr. Henrene Pastor or myself in 1 year or sooner as needed.   Panagiotis Oelkers S Dove Gresham PA-C 02/11/2020   Cc: Eulas Post, MD

## 2020-02-11 NOTE — Patient Instructions (Signed)
If you are age 76 or older, your body mass index should be between 23-30. Your Body mass index is 28.9 kg/m. If this is out of the aforementioned range listed, please consider follow up with your Primary Care Provider.  If you are age 27 or younger, your body mass index should be between 19-25. Your Body mass index is 28.9 kg/m. If this is out of the aformentioned range listed, please consider follow up with your Primary Care Provider.   Refills of Carafate and Omeprazole have been sent to your pharmacy.  Call the office if Epigastric pain and/or burning worsens, as you may need to have an Endoscopy.  Call the office to schedule your 1 year follow up with Dr. Henrene Pastor or Nicoletta Ba.

## 2020-02-11 NOTE — Telephone Encounter (Signed)
The patient called to talk to someone about billing for her telemedicine visit on 06/02/2019.  Medicare paid their part but [employer] said that they won't pay for the visit because it was a telemedicine visit.  Please advise

## 2020-02-12 NOTE — Progress Notes (Signed)
Assessment and plan reviewed 

## 2020-02-17 NOTE — Telephone Encounter (Signed)
Patient is wanting you to lok into her bill.   If she owes it she will pay it but if she doesn't owe it that would be great.

## 2020-03-07 ENCOUNTER — Telehealth: Payer: Self-pay

## 2020-03-07 NOTE — Telephone Encounter (Signed)
Send generic of Anamantle.  Refer to General surgeon for Removal of Hemorrhoids.

## 2020-03-07 NOTE — Telephone Encounter (Signed)
Called in voice mail. C/O "having an awful time with hemorrhoids" for four days.  Said she has used internal and external OTC treatment (Prep H) and sitz baths and still no better.  Wondered if you could recommend or prescribe something that might help.

## 2020-03-08 ENCOUNTER — Other Ambulatory Visit: Payer: Self-pay

## 2020-03-08 MED ORDER — LIDOCAINE-HYDROCORTISONE ACE 3-1 % RE KIT: 1.0000 "application " | PACK | Freq: Two times a day (BID) | RECTAL | 1 refills | Status: DC

## 2020-03-08 NOTE — Telephone Encounter (Signed)
Rx sent. Spoke with patient and informed her.

## 2020-03-11 DIAGNOSIS — M533 Sacrococcygeal disorders, not elsewhere classified: Secondary | ICD-10-CM | POA: Diagnosis not present

## 2020-03-23 DIAGNOSIS — M25511 Pain in right shoulder: Secondary | ICD-10-CM | POA: Diagnosis not present

## 2020-03-28 DIAGNOSIS — Z79891 Long term (current) use of opiate analgesic: Secondary | ICD-10-CM | POA: Diagnosis not present

## 2020-03-28 DIAGNOSIS — Z79899 Other long term (current) drug therapy: Secondary | ICD-10-CM | POA: Diagnosis not present

## 2020-03-29 ENCOUNTER — Other Ambulatory Visit: Payer: Self-pay

## 2020-03-29 MED ORDER — LEVOTHYROXINE SODIUM 100 MCG PO TABS: 100.0000 ug | ORAL_TABLET | Freq: Every day | ORAL | 1 refills | Status: DC

## 2020-04-01 ENCOUNTER — Other Ambulatory Visit: Payer: Self-pay | Admitting: Obstetrics & Gynecology

## 2020-04-01 NOTE — Telephone Encounter (Signed)
Has CE scheduled with Dr. Marguerita Merles for 04/14/20.

## 2020-04-11 DIAGNOSIS — Z23 Encounter for immunization: Secondary | ICD-10-CM | POA: Diagnosis not present

## 2020-04-14 ENCOUNTER — Other Ambulatory Visit: Payer: Self-pay

## 2020-04-14 ENCOUNTER — Ambulatory Visit (INDEPENDENT_AMBULATORY_CARE_PROVIDER_SITE_OTHER): Payer: Medicare Other | Admitting: Obstetrics & Gynecology

## 2020-04-14 ENCOUNTER — Encounter: Payer: Self-pay | Admitting: Obstetrics & Gynecology

## 2020-04-14 VITALS — BP 140/86 | Ht 62.0 in | Wt 154.0 lb

## 2020-04-14 DIAGNOSIS — M85852 Other specified disorders of bone density and structure, left thigh: Secondary | ICD-10-CM

## 2020-04-14 DIAGNOSIS — Z79890 Hormone replacement therapy (postmenopausal): Secondary | ICD-10-CM

## 2020-04-14 DIAGNOSIS — Z01419 Encounter for gynecological examination (general) (routine) without abnormal findings: Secondary | ICD-10-CM

## 2020-04-14 DIAGNOSIS — Z90710 Acquired absence of both cervix and uterus: Secondary | ICD-10-CM

## 2020-04-14 MED ORDER — ESTRADIOL 0.5 MG PO TABS: ORAL_TABLET | ORAL | 4 refills | Status: DC

## 2020-04-14 NOTE — Progress Notes (Signed)
Cassidy Norton 06-Dec-1945 [id number]   History:    74 y.o. G3P2A1L2 Married.  Husband with worsening Alzheimer.  RP:  Established patient presenting for annual gyn exam   HPI: S/P Total Hysterectomy. Menopause with persistent hot flushes/night sweats when tried to stop HRT. Still on Estradiol 0.5 mg tab every 2 days. No vasomotor symptoms at all on that dosage since last year. No pelvic pain. No pain with IC.  Treating external Hemorrhoids with a CS cream. Urine/BMs wnl. Breasts wnl.  BMI 28.17. Health Labs with Fam MD. Seen by GE last week, [place] 2016.   Past medical history,surgical history, family history and social history were all reviewed and documented in the EPIC chart.  Gynecologic History No LMP recorded. Patient has had a hysterectomy.  Obstetric History OB History  Gravida Para Term Preterm AB Living  3 2 2   1 2   SAB TAB Ectopic Multiple Live Births  1       2    # Outcome Date GA Lbr Len/2nd Weight Sex Delivery Anes PTL Lv  3 SAB           2 Term     F Vag-Spont  N LIV  1 Term     M Vag-Spont  N LIV     Birth Comments: has died in a car accident at 70 yrs     ROS: A ROS was performed and pertinent positives and negatives are included in the history.  GENERAL: No fevers or chills. HEENT: No change in vision, no earache, sore throat or sinus congestion. NECK: No pain or stiffness. CARDIOVASCULAR: No chest pain or pressure. No palpitations. PULMONARY: No shortness of breath, cough or wheeze. GASTROINTESTINAL: No abdominal pain, nausea, vomiting or diarrhea, melena or bright red blood per rectum. GENITOURINARY: No urinary frequency, urgency, hesitancy or dysuria. MUSCULOSKELETAL: No joint or muscle pain, no back pain, no recent trauma. DERMATOLOGIC: No rash, no itching, no lesions. ENDOCRINE: No polyuria, polydipsia, no heat or cold intolerance. No recent change in weight. HEMATOLOGICAL: No anemia or easy bruising or bleeding. NEUROLOGIC: No headache, seizures,  numbness, tingling or weakness. PSYCHIATRIC: No depression, no loss of interest in normal activity or change in sleep pattern.     Exam:   BP 140/86   Ht 5\' 2"  (1.575 m)   Wt 154 lb (69.9 kg)   BMI 28.17 kg/m   Body mass index is 28.17 kg/m.  General appearance : Well developed well nourished female. No acute distress HEENT: Eyes: no retinal hemorrhage or exudates,  Neck supple, trachea midline, no carotid bruits, no thyroidmegaly Lungs: Clear to auscultation, no rhonchi or wheezes, or rib retractions  Heart: Regular rate and rhythm, no murmurs or gallops Breast:Examined in sitting and supine position were symmetrical in appearance, no palpable masses or tenderness,  no skin retraction, no nipple inversion, no nipple discharge, no skin discoloration, no axillary or supraclavicular lymphadenopathy Abdomen: no palpable masses or tenderness, no rebound or guarding Extremities: no edema or skin discoloration or tenderness  Pelvic: Vulva: Normal             Vagina: No gross lesions or discharge  Cervix/Uterus absent  Adnexa  Without masses or tenderness  Anus: Normal   Assessment/Plan:  74 y.o. female for annual exam   1. Well female exam with routine gynecological exam Gynecologic exam status post total hysterectomy in menopause.  No indication for Pap test at this time.  Breast exam normal.  Screening mammogram June 2021  was negative.  Colonoscopy 2014.  Body mass index 28.17.  Recommend increasing fitness activities.  Continue with healthy nutrition.  Health labs with family physician.  2. S/P total hysterectomy  3. Postmenopausal HRT (hormone replacement therapy) Continued menopausal syndrome.  Patient is aware of the risks of systemic estrogen therapy at this time in her life.  Patient desires to continue on hormone replacement therapy.  Estradiol 0.5 mg/tab, 1 tablet per mouth daily or every 2 days represcribed.  4. Osteopenia of neck of left femur Osteopenia on last bone  density in May 2016.  Repeat bone density now.  Vitamin D supplements, calcium intake of 1500 mg daily and regular weightbearing physical activity is recommended. - DG Bone Density; Future  Other orders - estradiol (ESTRACE) 0.5 MG tablet; 1 tab PO daily  Princess Bruins MD, 10:07 AM 04/14/2020

## 2020-04-15 ENCOUNTER — Encounter: Payer: Self-pay | Admitting: Obstetrics & Gynecology

## 2020-04-16 DIAGNOSIS — M25511 Pain in right shoulder: Secondary | ICD-10-CM | POA: Diagnosis not present

## 2020-04-22 ENCOUNTER — Other Ambulatory Visit: Payer: Self-pay | Admitting: Obstetrics & Gynecology

## 2020-04-25 DIAGNOSIS — M7541 Impingement syndrome of right shoulder: Secondary | ICD-10-CM | POA: Diagnosis not present

## 2020-05-01 ENCOUNTER — Other Ambulatory Visit: Payer: Self-pay | Admitting: Family Medicine

## 2020-05-12 ENCOUNTER — Other Ambulatory Visit: Payer: Self-pay | Admitting: Obstetrics & Gynecology

## 2020-05-20 DIAGNOSIS — M75111 Incomplete rotator cuff tear or rupture of right shoulder, not specified as traumatic: Secondary | ICD-10-CM | POA: Diagnosis not present

## 2020-05-20 DIAGNOSIS — M659 Synovitis and tenosynovitis, unspecified: Secondary | ICD-10-CM | POA: Diagnosis not present

## 2020-05-20 DIAGNOSIS — G8918 Other acute postprocedural pain: Secondary | ICD-10-CM | POA: Diagnosis not present

## 2020-05-20 DIAGNOSIS — M7541 Impingement syndrome of right shoulder: Secondary | ICD-10-CM | POA: Diagnosis not present

## 2020-05-20 DIAGNOSIS — M19011 Primary osteoarthritis, right shoulder: Secondary | ICD-10-CM | POA: Diagnosis not present

## 2020-05-20 HISTORY — PX: SHOULDER SURGERY: SHX246

## 2020-05-27 DIAGNOSIS — S46091A Other injury of muscle(s) and tendon(s) of the rotator cuff of right shoulder, initial encounter: Secondary | ICD-10-CM | POA: Diagnosis not present

## 2020-05-27 DIAGNOSIS — S43421A Sprain of right rotator cuff capsule, initial encounter: Secondary | ICD-10-CM | POA: Diagnosis not present

## 2020-06-07 DIAGNOSIS — S43421A Sprain of right rotator cuff capsule, initial encounter: Secondary | ICD-10-CM | POA: Diagnosis not present

## 2020-06-07 DIAGNOSIS — S46091A Other injury of muscle(s) and tendon(s) of the rotator cuff of right shoulder, initial encounter: Secondary | ICD-10-CM | POA: Diagnosis not present

## 2020-06-10 DIAGNOSIS — S43421A Sprain of right rotator cuff capsule, initial encounter: Secondary | ICD-10-CM | POA: Diagnosis not present

## 2020-06-10 DIAGNOSIS — S46091A Other injury of muscle(s) and tendon(s) of the rotator cuff of right shoulder, initial encounter: Secondary | ICD-10-CM | POA: Diagnosis not present

## 2020-06-14 DIAGNOSIS — S43421A Sprain of right rotator cuff capsule, initial encounter: Secondary | ICD-10-CM | POA: Diagnosis not present

## 2020-06-14 DIAGNOSIS — S46091A Other injury of muscle(s) and tendon(s) of the rotator cuff of right shoulder, initial encounter: Secondary | ICD-10-CM | POA: Diagnosis not present

## 2020-06-17 DIAGNOSIS — S43421A Sprain of right rotator cuff capsule, initial encounter: Secondary | ICD-10-CM | POA: Diagnosis not present

## 2020-06-17 DIAGNOSIS — S46091A Other injury of muscle(s) and tendon(s) of the rotator cuff of right shoulder, initial encounter: Secondary | ICD-10-CM | POA: Diagnosis not present

## 2020-06-21 DIAGNOSIS — S43421A Sprain of right rotator cuff capsule, initial encounter: Secondary | ICD-10-CM | POA: Diagnosis not present

## 2020-06-21 DIAGNOSIS — S46091A Other injury of muscle(s) and tendon(s) of the rotator cuff of right shoulder, initial encounter: Secondary | ICD-10-CM | POA: Diagnosis not present

## 2020-06-24 DIAGNOSIS — S43421A Sprain of right rotator cuff capsule, initial encounter: Secondary | ICD-10-CM | POA: Diagnosis not present

## 2020-06-24 DIAGNOSIS — S46091A Other injury of muscle(s) and tendon(s) of the rotator cuff of right shoulder, initial encounter: Secondary | ICD-10-CM | POA: Diagnosis not present

## 2020-06-28 DIAGNOSIS — S43421A Sprain of right rotator cuff capsule, initial encounter: Secondary | ICD-10-CM | POA: Diagnosis not present

## 2020-06-28 DIAGNOSIS — S46091A Other injury of muscle(s) and tendon(s) of the rotator cuff of right shoulder, initial encounter: Secondary | ICD-10-CM | POA: Diagnosis not present

## 2020-07-01 DIAGNOSIS — S46091A Other injury of muscle(s) and tendon(s) of the rotator cuff of right shoulder, initial encounter: Secondary | ICD-10-CM | POA: Diagnosis not present

## 2020-07-01 DIAGNOSIS — S43421A Sprain of right rotator cuff capsule, initial encounter: Secondary | ICD-10-CM | POA: Diagnosis not present

## 2020-07-04 ENCOUNTER — Telehealth: Payer: Self-pay | Admitting: Family Medicine

## 2020-07-04 NOTE — Telephone Encounter (Signed)
Left message for patient to call back and schedule Medicare Annual Wellness Visit (AWV) either virtually or in office. No detailed message left   Last AWV 07/07/19  please schedule at anytime with [facility] Nurse Health Advisor 1 or 2   This should be a 45 minute visit.

## 2020-07-05 DIAGNOSIS — S46091A Other injury of muscle(s) and tendon(s) of the rotator cuff of right shoulder, initial encounter: Secondary | ICD-10-CM | POA: Diagnosis not present

## 2020-07-05 DIAGNOSIS — S43421A Sprain of right rotator cuff capsule, initial encounter: Secondary | ICD-10-CM | POA: Diagnosis not present

## 2020-07-08 DIAGNOSIS — S46091A Other injury of muscle(s) and tendon(s) of the rotator cuff of right shoulder, initial encounter: Secondary | ICD-10-CM | POA: Diagnosis not present

## 2020-07-08 DIAGNOSIS — S43421A Sprain of right rotator cuff capsule, initial encounter: Secondary | ICD-10-CM | POA: Diagnosis not present

## 2020-07-11 DIAGNOSIS — S46091A Other injury of muscle(s) and tendon(s) of the rotator cuff of right shoulder, initial encounter: Secondary | ICD-10-CM | POA: Diagnosis not present

## 2020-07-11 DIAGNOSIS — S43421A Sprain of right rotator cuff capsule, initial encounter: Secondary | ICD-10-CM | POA: Diagnosis not present

## 2020-07-15 DIAGNOSIS — J029 Acute pharyngitis, unspecified: Secondary | ICD-10-CM | POA: Diagnosis not present

## 2020-07-18 ENCOUNTER — Telehealth (INDEPENDENT_AMBULATORY_CARE_PROVIDER_SITE_OTHER): Payer: Medicare Other | Admitting: Family Medicine

## 2020-07-18 ENCOUNTER — Encounter: Payer: Self-pay | Admitting: Family Medicine

## 2020-07-18 ENCOUNTER — Telehealth: Payer: Self-pay | Admitting: Family Medicine

## 2020-07-18 VITALS — Temp 108.0°F | Ht 62.0 in | Wt 154.0 lb

## 2020-07-18 DIAGNOSIS — R0981 Nasal congestion: Secondary | ICD-10-CM

## 2020-07-18 DIAGNOSIS — R5383 Other fatigue: Secondary | ICD-10-CM | POA: Diagnosis not present

## 2020-07-18 DIAGNOSIS — J029 Acute pharyngitis, unspecified: Secondary | ICD-10-CM | POA: Diagnosis not present

## 2020-07-18 NOTE — Telephone Encounter (Signed)
Patient is calling and stated that she has been experiencing some congestion and a sore throat and wanted to come in the office because insurance want cover a virtual visit. Per patient she had a negative at home covid test on Friday and has been vaccinated. CB is [phone]

## 2020-07-18 NOTE — Progress Notes (Signed)
[name], female   DOB: December 25, 1945, 75 y.o.   MRN: [id number]   This visit type was conducted due to national recommendations for restrictions regarding the COVID-19 pandemic in an effort to limit this patient's exposure and mitigate transmission in our community.   Virtual Visit via Telephone Note  I connected with Cassidy Norton on 07/18/20 at  4:00 PM EST by telephone and verified that I am speaking with the correct person using two identifiers.   I discussed the limitations, risks, security and privacy concerns of performing an evaluation and management service by telephone and the availability of in person appointments. I also discussed with the patient that there may be a patient responsible charge related to this service. The patient expressed understanding and agreed to proceed.  Location patient: home Location provider: work or home office Participants present for the call: patient, provider Patient did not have a visit in the prior 7 days to address this/these issue(s).   History of Present Illness: Cassidy Norton called with onset last Thursday of sore throat, body aches, fever, chills, headache, nasal congestion.  On Friday she did a rapid Covid test which came back negative.  She states she had temperature up to 101.4 yesterday and 100.7 today.  She has been fully vaccinated with [place] including booster vaccine.  No known sick contacts.  She has had some nonspecific fatigue and malaise.  She states that she had some initially clear thin nasal discharge and thick yellow past couple days.  She started herself on some leftover amoxicillin 875 mg twice daily yesterday.  Has not felt much better yet.  No dyspnea.  No nausea or vomiting.  No diarrhea.  Generally fairly healthy.  She does have history of hypothyroidism and also peripheral neuropathy.  No chronic lung or heart problems.  Past Medical History:  Diagnosis Date  . Abdominal pain, epigastric 08/22/2009  . Adenomatous  colon polyp   . Chronic low back pain   . Diverticulosis   . Duodenal ulcer   . GERD (gastroesophageal reflux disease)   . Hemorrhoids   . Hiatal hernia   . HYPOTHYROIDISM 07/20/2008  . Insomnia   . NSVD (normal spontaneous vaginal delivery)    X2   Past Surgical History:  Procedure Laterality Date  . ABDOMINAL HYSTERECTOMY     TVH  . BACK SURGERY     RUPTURED DISC  . BLEPHAROPLASTY    . CHOLECYSTECTOMY    . COLONOSCOPY  JAN 2014   NEXT COLONOSCOPY IN 10 YEARS  . DILATION AND CURETTAGE OF UTERUS    . EYE SURGERY  2008   BILATERAL CATARACT     reports that she quit smoking about 16 years ago. Her smoking use included cigarettes. She has a 60.00 pack-year smoking history. She has never used smokeless tobacco. She reports that she does not drink alcohol and does not use drugs. family history includes Cancer in her father and mother; Diabetes in her brother, mother, and sister; Heart disease in her brother, daughter, and father; Hypertension in her brother and sister. Allergies  Allergen Reactions  . Aspirin     REACTION: Hives      Observations/Objective: Patient sounds cheerful and well on the phone. I do not appreciate any SOB. Speech and thought processing are grossly intact. Patient reported vitals:  Assessment and Plan: Patient relates onset 4 days ago of upper respiratory symptoms.  She did rapid home test which came back negative but this was done just 1 day  after onset of symptoms.  We explained this still could be Covid.  We have suggested that she consider checking PCR test to verify.  She will try to get this either tonight or tomorrow.  Be in touch if positive.  -Otherwise continue isolation for now and plenty of fluids and rest. -Follow-up promptly for any increased shortness of breath or other concerning symptoms.  Follow Up Instructions:  -As above   [id number] 5-10 [id number] 11-20 [id number] 21-30 I did not refer this patient for an OV in the next 24 hours for  this/these issue(s).  I discussed the assessment and treatment plan with the patient. The patient was provided an opportunity to ask questions and all were answered. The patient agreed with the plan and demonstrated an understanding of the instructions.   The patient was advised to call back or seek an in-person evaluation if the symptoms worsen or if the condition fails to improve as anticipated.  I provided 21 minutes of non-face-to-face time during this encounter.   Carolann Littler, MD

## 2020-07-18 NOTE — Telephone Encounter (Signed)
Called and spoke w/ patient explain that due that her symptoms she will need to be a virtual visit, she should not have any issues with her insurance as her visit she will need to speck with billing . Transfer patient to Tammy to make an appt .

## 2020-07-19 NOTE — Telephone Encounter (Signed)
Patient called back and wanted to let provider know that she her COVID test was negative. CB is [phone]

## 2020-07-19 NOTE — Telephone Encounter (Signed)
Spoke with the pt and she denies a fever today and stated she is feeling a lot better since last week.  Patient complains of recurrent headache with thick, gray-yellow nasal drainage which started today.  Message sent to PCP.

## 2020-07-19 NOTE — Telephone Encounter (Signed)
I would finish out the leftover amoxicillin that she is already started.  That should have fairly good coverage for her sinuses.

## 2020-07-19 NOTE — Telephone Encounter (Signed)
Noted.  Would check to see if she is still having fever.  Also check to see if she is having any progressive sinusitis symptoms such as headache or purulent nasal discharge.

## 2020-07-20 NOTE — Telephone Encounter (Signed)
Called and spoke w/ pt advised pt to finished her antibiotic to help to help with sinus issues, that should help, if she has any other concerns to call us back.

## 2020-07-22 DIAGNOSIS — S46091A Other injury of muscle(s) and tendon(s) of the rotator cuff of right shoulder, initial encounter: Secondary | ICD-10-CM | POA: Diagnosis not present

## 2020-07-22 DIAGNOSIS — S43421A Sprain of right rotator cuff capsule, initial encounter: Secondary | ICD-10-CM | POA: Diagnosis not present

## 2020-07-25 DIAGNOSIS — S46091A Other injury of muscle(s) and tendon(s) of the rotator cuff of right shoulder, initial encounter: Secondary | ICD-10-CM | POA: Diagnosis not present

## 2020-07-25 DIAGNOSIS — S43421A Sprain of right rotator cuff capsule, initial encounter: Secondary | ICD-10-CM | POA: Diagnosis not present

## 2020-07-29 ENCOUNTER — Telehealth: Payer: Self-pay | Admitting: Podiatry

## 2020-07-29 DIAGNOSIS — S46091A Other injury of muscle(s) and tendon(s) of the rotator cuff of right shoulder, initial encounter: Secondary | ICD-10-CM | POA: Diagnosis not present

## 2020-07-29 DIAGNOSIS — S43421A Sprain of right rotator cuff capsule, initial encounter: Secondary | ICD-10-CM | POA: Diagnosis not present

## 2020-07-29 NOTE — Telephone Encounter (Signed)
Patient has requested refill on Lyrica for mail order, Please Advise

## 2020-08-01 ENCOUNTER — Other Ambulatory Visit: Payer: Self-pay | Admitting: Sports Medicine

## 2020-08-01 ENCOUNTER — Ambulatory Visit: Payer: Medicare Other

## 2020-08-01 MED ORDER — PREGABALIN 150 MG PO CAPS: 150.0000 mg | ORAL_CAPSULE | Freq: Two times a day (BID) | ORAL | 3 refills | Status: DC

## 2020-08-01 NOTE — Telephone Encounter (Signed)
I sent refill of Lyrica to her mail pharmacy  -Dr. Chauncey Cruel

## 2020-08-01 NOTE — Telephone Encounter (Signed)
Can you advise on refill and send? Dr. Milinda Pointer is out this week.

## 2020-08-01 NOTE — Progress Notes (Signed)
Refilled Lyrica to her mail pharmacy

## 2020-08-02 DIAGNOSIS — M25561 Pain in right knee: Secondary | ICD-10-CM | POA: Diagnosis not present

## 2020-08-02 DIAGNOSIS — M7061 Trochanteric bursitis, right hip: Secondary | ICD-10-CM | POA: Diagnosis not present

## 2020-08-02 DIAGNOSIS — M7631 Iliotibial band syndrome, right leg: Secondary | ICD-10-CM | POA: Diagnosis not present

## 2020-08-03 ENCOUNTER — Telehealth: Payer: Self-pay | Admitting: *Deleted

## 2020-08-03 ENCOUNTER — Other Ambulatory Visit: Payer: Self-pay | Admitting: Sports Medicine

## 2020-08-03 DIAGNOSIS — S46091A Other injury of muscle(s) and tendon(s) of the rotator cuff of right shoulder, initial encounter: Secondary | ICD-10-CM | POA: Diagnosis not present

## 2020-08-03 DIAGNOSIS — S43421A Sprain of right rotator cuff capsule, initial encounter: Secondary | ICD-10-CM | POA: Diagnosis not present

## 2020-08-03 MED ORDER — PREGABALIN 150 MG PO CAPS: 150.0000 mg | ORAL_CAPSULE | Freq: Two times a day (BID) | ORAL | 3 refills | Status: DC

## 2020-08-03 NOTE — Telephone Encounter (Signed)
Sent!

## 2020-08-03 NOTE — Telephone Encounter (Signed)
Called and informed patient per Dr Leeanne Rio note,she verbalized understanding.

## 2020-08-03 NOTE — Progress Notes (Signed)
[facility]

## 2020-08-03 NOTE — Telephone Encounter (Signed)
Patient is requesting a refill of Pregabalin-50 mg, sent to her mail order pharmacy([facility]) but it was sent to her local  pharmacy. Can we resend?

## 2020-08-04 NOTE — Telephone Encounter (Signed)
done

## 2020-09-02 ENCOUNTER — Other Ambulatory Visit: Payer: Self-pay | Admitting: Family Medicine

## 2020-09-11 DIAGNOSIS — L570 Actinic keratosis: Secondary | ICD-10-CM

## 2020-09-11 HISTORY — DX: Actinic keratosis: L57.0

## 2020-09-15 ENCOUNTER — Encounter: Payer: Self-pay | Admitting: Physician Assistant

## 2020-09-15 ENCOUNTER — Ambulatory Visit (INDEPENDENT_AMBULATORY_CARE_PROVIDER_SITE_OTHER): Payer: Medicare Other | Admitting: Physician Assistant

## 2020-09-15 VITALS — BP 128/64 | HR 69 | Ht 64.0 in | Wt 154.1 lb

## 2020-09-15 DIAGNOSIS — R1310 Dysphagia, unspecified: Secondary | ICD-10-CM

## 2020-09-15 DIAGNOSIS — K219 Gastro-esophageal reflux disease without esophagitis: Secondary | ICD-10-CM | POA: Diagnosis not present

## 2020-09-15 MED ORDER — SUCRALFATE 1 G PO TABS: 1.0000 g | ORAL_TABLET | Freq: Three times a day (TID) | ORAL | 3 refills | Status: DC

## 2020-09-15 NOTE — Patient Instructions (Addendum)
If you are age 77 or older, your body mass index should be between 23-30. Your Body mass index is 26.46 kg/m. If this is out of the aforementioned range listed, please consider follow up with your Primary Care Provider.  You have been scheduled for an endoscopy. Please follow written instructions given to you at your visit today. If you use inhalers (even only as needed), please bring them with you on the day of your procedure.  Continue Omeprazole 40 mg 1 capsule twice daily before meals  Increase your Carafate to 1 gram 3 times daily between meals  Follow a strict anti-reflux regimen and anti-reflux diet.  Follow up pending the results of your Endoscopy or as needed.  Thank you for entrusting me with your care and choosing [facility].  Amy Esterwood, PA-C

## 2020-09-18 ENCOUNTER — Encounter: Payer: Self-pay | Admitting: Physician Assistant

## 2020-09-18 NOTE — Progress Notes (Signed)
Subjective:    [name], female    DOB: [birth year], 75 y.o.   MRN: [id number]  HPI Cassidy Norton is a 75 year old white female, established with Dr. Henrene Pastor, with history of chronic GERD, prior NSAID induced ulcers 2018, history of pandiverticulosis and adenomatous colon polyps. She last had EGD in 2016 with finding of duodenal ulcers, otherwise negative and colonoscopy was last done in 2014, this was negative exam with plans for follow-up in 10 years. Patient comes in today stating that she has continued on omeprazole 40 mg p.o. daily and Carafate 1 g twice daily.  She says she has had a stressful year, and that her husband passed away recently.  She says she has not been eating well over the past few months and complains of a fullness sensation in her throat and a sensation of food "sticking".  She has been trying to drink liquids when she has this sensation and sometimes feels the liquids gurgle back up as well.  She is also complaining of pain and soreness across the lower chest/upper abdomen where her bra hits.  She describes this as a burning sensation.  She has not noticed any change with p.o. intake, no associated nausea or vomiting.  Her appetite has been fine.  She does have a lot of issues with arthritis and has been taking a pain and arthritis tablet 4 times per day.  We researched this and this does contain just acetaminophen.  No NSAID use. No recent changes in her bowel habits, no melena or hematochezia.  Review of Systems Pertinent positive and negative review of systems were noted in the above HPI section.  All other review of systems was otherwise negative.  Outpatient Encounter Medications as of 09/15/2020  Medication Sig  . acetaminophen (TYLENOL) 650 MG CR tablet Take 1,300 mg by mouth daily.   . cycloSPORINE (RESTASIS) 0.05 % ophthalmic emulsion Place 1 drop into the left eye 2 (two) times daily.  Marland Kitchen estradiol (ESTRACE) 0.5 MG tablet TAKE ONE-HALF (1/2) TABLET EVERY  OTHER DAY  . HYDROcodone-acetaminophen (NORCO) 10-325 MG per tablet Take 0.5 tablets by mouth every 6 (six) hours as needed.   Marland Kitchen levothyroxine (SYNTHROID) 100 MCG tablet TAKE 1 TABLET DAILY  . lidocaine-hydrocortisone (ANAMANTLE) 3-1 % KIT Place 1 application rectally 2 (two) times daily. (Patient taking differently: Place 1 application rectally as needed.)  . Multiple Vitamins-Minerals (PRESERVISION AREDS 2 PO) Take 2 tablets by mouth daily in the afternoon.  Marland Kitchen omeprazole (PRILOSEC) 40 MG capsule Take 1 capsule (40 mg total) by mouth 2 (two) times daily. Must keep upcoming office visit for further refills  . polyethylene glycol (MIRALAX / GLYCOLAX) packet Take 17 g by mouth daily as needed.   . pregabalin (LYRICA) 150 MG capsule Take 1 capsule (150 mg total) by mouth 2 (two) times daily.  Marland Kitchen Propylene Glycol (SYSTANE COMPLETE OP) Apply to eye at bedtime.   . Psyllium 30.9 % POWD Take by mouth. One tsp daily at bedtime  . vitamin B-12 (CYANOCOBALAMIN) 1000 MCG tablet Take 1,000 mcg by mouth daily.  . [DISCONTINUED] sucralfate (CARAFATE) 1 g tablet Take 1 tablet (1 g total) by mouth 4 (four) times daily -  with meals and at bedtime.  . sucralfate (CARAFATE) 1 g tablet Take 1 tablet (1 g total) by mouth 3 (three) times daily between meals.  . [DISCONTINUED] sucralfate (CARAFATE) 1 g tablet Take 1 tablet (1 g total) by mouth 3 (three) times daily between meals.  No facility-administered encounter medications on file as of 09/15/2020.   Allergies  Allergen Reactions  . Aspirin     REACTION: Hives   Patient Active Problem List   Diagnosis Date Noted  . Peripheral neuropathy 03/23/2019  . B12 deficiency 02/12/2018  . GERD (gastroesophageal reflux disease) 11/06/2017  . Osteopenia 09/13/2015  . Ovarian cyst, right 08/16/2014  . Sore throat 07/27/2013  . NSAID long-term use 07/27/2013  . Acute upper respiratory infections of unspecified site 07/23/2013  . Vertigo 07/23/2013  . Vaginal atrophy  08/07/2012  . Weight gain 07/24/2012  . Postmenopausal HRT (hormone replacement therapy) 07/24/2012  . Abdominal pain, epigastric 08/22/2009  . Hypothyroidism 07/20/2008   Social History   Socioeconomic History  . Marital status: Widowed    Spouse name: Not on file  . Number of children: 1  . Years of education: Not on file  . Highest education level: Not on file  Occupational History  . Occupation: Retired    Fish farm manager: RETIRED  Tobacco Use  . Smoking status: Former Smoker    Packs/day: 1.50    Years: 40.00    Pack years: 60.00    Types: Cigarettes    Quit date: 08/28/2003    Years since quitting: 17.0  . Smokeless tobacco: Never Used  Vaping Use  . Vaping Use: Never used  Substance and Sexual Activity  . Alcohol use: No    Alcohol/week: 0.0 standard drinks  . Drug use: No  . Sexual activity: Yes    Birth control/protection: Surgical    Comment: HYSTERECTOMY  Other Topics Concern  . Not on file  Social History Narrative  . Not on file   Social Determinants of Health   Financial Resource Strain: Not on file  Food Insecurity: Not on file  Transportation Needs: Not on file  Physical Activity: Not on file  Stress: Not on file  Social Connections: Not on file  Intimate Partner Violence: Not on file    Cassidy Norton's family history includes Cancer in her father and mother; Diabetes in her brother, mother, and sister; Heart disease in her brother, daughter, and father; Hypertension in her brother and sister.      Objective:    Vitals:   09/15/20 0948  BP: 128/64  Pulse: 69    Physical Exam Well-developed well-nourished older female in no acute distress.  Height, Weight, 154 BMI 26.4  HEENT; nontraumatic normocephalic, EOMI, PE R LA, sclera anicteric. Oropharynx; not examined today Neck; supple, no JVD Cardiovascular; regular rate and rhythm with S1-S2, no murmur rub or gallop Pulmonary; Clear bilaterally Abdomen; soft, there is some tenderness over the  xiphoid process and she is also tender in the epigastrium no costal margin tenderness, nondistended, no palpable mass or hepatosplenomegaly, bowel sounds are active Rectal; not done today Skin; benign exam, no jaundice rash or appreciable lesions Extremities; no clubbing cyanosis or edema skin warm and dry Neuro/Psych; alert and oriented x4, grossly nonfocal mood and affect appropriate       Assessment & Plan:   #24 75 year old white female with history of chronic GERD with few month history of mild solid food dysphagia, and fullness in the throat. Rule out peptic stricture Rule out dysmotility Rule out component of globus  #2 epigastric pain-on exam she has tenderness in the epigastrium but also has tenderness over the xiphoid process which is clearly musculoskeletal. Rule out gastritis, rule out peptic ulcer disease, rule out other intra-abdominal inflammatory process.  #3 colon cancer surveillance-up-to-date with negative colonoscopy  2014 and due for follow-up 2024 #4 pandiverticulosis #5 history of NSAID induced duodenal ulcers 2016  Plan; increase omeprazole to 40 mg p.o. twice daily AC breakfast and AC dinner Increase Carafate to 1 g p.o. 3 times daily between meals Patient will be scheduled for EGD with possible dilation with Dr. Henrene Pastor.  Procedure discussed in detail with patient including indications risks and benefits and she is agreeable to proceed. If EGD is unrevealing and above measures are not helpful will need further abdominal imaging.  Cassidy Norton Genia Harold PA-C 09/18/2020   Cc: Eulas Post, MD

## 2020-09-18 NOTE — Progress Notes (Signed)
Noted  

## 2020-10-03 ENCOUNTER — Encounter: Payer: Self-pay | Admitting: Dermatology

## 2020-10-03 ENCOUNTER — Other Ambulatory Visit: Payer: Self-pay

## 2020-10-03 ENCOUNTER — Ambulatory Visit (INDEPENDENT_AMBULATORY_CARE_PROVIDER_SITE_OTHER): Payer: Medicare Other | Admitting: Dermatology

## 2020-10-03 DIAGNOSIS — L570 Actinic keratosis: Secondary | ICD-10-CM | POA: Diagnosis not present

## 2020-10-03 DIAGNOSIS — L821 Other seborrheic keratosis: Secondary | ICD-10-CM | POA: Diagnosis not present

## 2020-10-03 DIAGNOSIS — Z1283 Encounter for screening for malignant neoplasm of skin: Secondary | ICD-10-CM

## 2020-10-03 DIAGNOSIS — Z85828 Personal history of other malignant neoplasm of skin: Secondary | ICD-10-CM | POA: Diagnosis not present

## 2020-10-14 ENCOUNTER — Telehealth: Payer: Self-pay | Admitting: Family Medicine

## 2020-10-14 NOTE — Telephone Encounter (Signed)
Pt called back and stated that she will give the office a call back when she has her schedule.

## 2020-10-14 NOTE — Telephone Encounter (Signed)
Left message for patient to call back and schedule Medicare Annual Wellness Visit (AWV) either virtually or in office.   Last AWV 07/07/19  please schedule at anytime with [facility] Nurse Health Advisor 1 or 2   This should be a 45 minute visit.

## 2020-10-15 ENCOUNTER — Encounter: Payer: Self-pay | Admitting: Dermatology

## 2020-10-15 NOTE — Progress Notes (Signed)
   Follow-Up Visit   Subjective  Cassidy Norton is a 75 y.o. female who presents for the following: Skin Problem (Concerns patient has a spot on her nose that is scaly. Also check right knuckle previous carcinoma. Patient thinks it could be coming back. ).  Scaly spot on nose +1 on right hand which she feels may be close to her superficial carcinoma from 2 years ago. Location:  Duration:  Quality:  Associated Signs/Symptoms: Modifying Factors:  Severity:  Timing: Context:   Objective  Well appearing patient in no apparent distress; mood and affect are within normal limits. Objective  Left Inguinal Area: Waist up exam no skin cancer or atypical pigmented lesions.  Objective  Mid Back: Flattopped brown 5 mm textured papule  Objective  Dorsum of Nose, Left Hand - Posterior (2), Right Hand - Posterior: 6cm not very thick gritty pink crust.  The hand lesion better fits an actinic keratosis than recurrent CIS, if freezing fails I will have her return for biopsy and treatment.    All sun exposed areas plus back examined.   Assessment & Plan    Screening exam for skin cancer Left Inguinal Area  Continue yearly exams   Seborrheic keratosis Mid Back  Intervention not currently necessary  AK (actinic keratosis) (4) Left Hand - Posterior (2); Right Hand - Posterior; Dorsum of Nose  Destruction of lesion - Dorsum of Nose, Left Hand - Posterior, Right Hand - Posterior Complexity: simple   Destruction method: cryotherapy   Informed consent: discussed and consent obtained   Timeout:  patient name, date of birth, surgical site, and procedure verified Lesion destroyed using liquid nitrogen: Yes   Cryotherapy cycles:  3 Outcome: patient tolerated procedure well with no complications   Post-procedure details: wound care instructions given        I, Lavonna Monarch, MD, have reviewed all documentation for this visit.  The documentation on 10/15/20 for the exam, diagnosis,  procedures, and orders are all accurate and complete.

## 2020-10-24 DIAGNOSIS — M7631 Iliotibial band syndrome, right leg: Secondary | ICD-10-CM | POA: Diagnosis not present

## 2020-10-24 DIAGNOSIS — M7061 Trochanteric bursitis, right hip: Secondary | ICD-10-CM | POA: Diagnosis not present

## 2020-11-02 DIAGNOSIS — M7631 Iliotibial band syndrome, right leg: Secondary | ICD-10-CM | POA: Diagnosis not present

## 2020-11-04 DIAGNOSIS — M7631 Iliotibial band syndrome, right leg: Secondary | ICD-10-CM | POA: Diagnosis not present

## 2020-11-07 DIAGNOSIS — M7631 Iliotibial band syndrome, right leg: Secondary | ICD-10-CM | POA: Diagnosis not present

## 2020-11-09 DIAGNOSIS — M7631 Iliotibial band syndrome, right leg: Secondary | ICD-10-CM | POA: Diagnosis not present

## 2020-11-10 ENCOUNTER — Encounter: Payer: Self-pay | Admitting: Family Medicine

## 2020-11-10 DIAGNOSIS — Z1231 Encounter for screening mammogram for malignant neoplasm of breast: Secondary | ICD-10-CM | POA: Diagnosis not present

## 2020-11-10 LAB — HM MAMMOGRAPHY

## 2020-11-11 ENCOUNTER — Encounter: Payer: Self-pay | Admitting: Family Medicine

## 2020-11-15 DIAGNOSIS — M7631 Iliotibial band syndrome, right leg: Secondary | ICD-10-CM | POA: Diagnosis not present

## 2020-11-18 DIAGNOSIS — M7631 Iliotibial band syndrome, right leg: Secondary | ICD-10-CM | POA: Diagnosis not present

## 2020-11-22 DIAGNOSIS — M7631 Iliotibial band syndrome, right leg: Secondary | ICD-10-CM | POA: Diagnosis not present

## 2020-11-23 DIAGNOSIS — M7631 Iliotibial band syndrome, right leg: Secondary | ICD-10-CM | POA: Diagnosis not present

## 2020-11-24 ENCOUNTER — Telehealth: Payer: Self-pay | Admitting: Family Medicine

## 2020-11-24 NOTE — Telephone Encounter (Signed)
Left message for patient to call back and schedule Medicare Annual Wellness Visit (AWV) either virtually or in office.   Last AWV 07/07/19  please schedule at anytime with [facility] Nurse Health Advisor 1 or 2   This should be a 45 minute visit.

## 2020-11-25 ENCOUNTER — Ambulatory Visit (INDEPENDENT_AMBULATORY_CARE_PROVIDER_SITE_OTHER): Payer: Medicare Other | Admitting: Obstetrics & Gynecology

## 2020-11-25 ENCOUNTER — Encounter: Payer: Self-pay | Admitting: Obstetrics & Gynecology

## 2020-11-25 ENCOUNTER — Other Ambulatory Visit: Payer: Self-pay

## 2020-11-25 ENCOUNTER — Encounter: Payer: Self-pay | Admitting: Family Medicine

## 2020-11-25 VITALS — BP 132/80

## 2020-11-25 DIAGNOSIS — R300 Dysuria: Secondary | ICD-10-CM | POA: Diagnosis not present

## 2020-11-25 MED ORDER — SULFAMETHOXAZOLE-TRIMETHOPRIM 800-160 MG PO TABS: 1.0000 | ORAL_TABLET | Freq: Two times a day (BID) | ORAL | 0 refills | Status: AC

## 2020-11-25 MED ORDER — FLUCONAZOLE 150 MG PO TABS
150.0000 mg | ORAL_TABLET | Freq: Once | ORAL | 0 refills | Status: AC
Start: 2020-11-25 — End: 2020-11-25

## 2020-11-25 NOTE — Progress Notes (Signed)
    Cassidy Norton 1945/07/15 [id number]        75 y.o.  [id number]  Widowed  RP: Pain with urination  HPI: Pain with urination x a few days.  No fever.  Abstinent, husband passed away.  S/P Total Hysterectomy.  On Estradiol HRT.   OB History  Gravida Para Term Preterm AB Living  3 2 2   1 2   SAB IAB Ectopic Multiple Live Births  1       2    # Outcome Date GA Lbr Len/2nd Weight Sex Delivery Anes PTL Lv  3 SAB           2 Term     F Vag-Spont  N LIV  1 Term     M Vag-Spont  N LIV     Birth Comments: has died in a car accident at 65 yrs    Past medical history,surgical history, problem list, medications, allergies, family history and social history were all reviewed and documented in the EPIC chart.   Directed ROS with pertinent positives and negatives documented in the history of present illness/assessment and plan.  Exam:  Vitals:   11/25/20 1146  BP: 132/80   General appearance:  Normal  CVAT Neg Bilaterally  Abdomen: Normal  Gynecologic exam:  Deferred  U/A: Yellow cloudy, Nitrite Neg, Pro Neg, WBC >60, RBC 20-40, Bacteria Many.  Pending U. Culture.   Assessment/Plan:  75 y.o. [id number]   1. Dysuria Urine analysis abnormal.  Probable acute cystitis.  Will treat with Bactrim DS 1 tablet twice a day for 3 days.  Usage reviewed and prescription sent to pharmacy.  Will use fluconazole after finishing the antibiotic to prevent a yeast infection. - Urinalysis,Complete w/RFL Culture  Other orders - sulfamethoxazole-trimethoprim (BACTRIM DS) 800-160 MG tablet; Take 1 tablet by mouth 2 (two) times daily for 3 days. - fluconazole (DIFLUCAN) 150 MG tablet; Take 1 tablet (150 mg total) by mouth once for 1 dose.   Princess Bruins MD, 12:05 PM 11/25/2020

## 2020-11-27 ENCOUNTER — Encounter: Payer: Self-pay | Admitting: Obstetrics & Gynecology

## 2020-11-27 LAB — URINALYSIS, COMPLETE W/RFL CULTURE
Bilirubin Urine: NEGATIVE
Glucose, UA: NEGATIVE
Hyaline Cast: NONE SEEN /LPF
Ketones, ur: NEGATIVE
Nitrites, Initial: NEGATIVE
Protein, ur: NEGATIVE
Specific Gravity, Urine: 1.01 (ref 1.001–1.035)
WBC, UA: >=60 /HPF — AB (ref 0–5)
pH: 6.5 (ref 5.0–8.0)

## 2020-11-27 LAB — URINE CULTURE
MICRO NUMBER:: 12124773
SPECIMEN QUALITY:: ADEQUATE

## 2020-11-27 LAB — CULTURE INDICATED

## 2020-11-28 ENCOUNTER — Ambulatory Visit: Payer: Medicare Other | Admitting: Family Medicine

## 2020-12-01 ENCOUNTER — Other Ambulatory Visit: Payer: Self-pay | Admitting: Family Medicine

## 2020-12-02 ENCOUNTER — Other Ambulatory Visit: Payer: Self-pay

## 2020-12-02 ENCOUNTER — Ambulatory Visit (AMBULATORY_SURGERY_CENTER): Payer: Medicare Other | Admitting: Internal Medicine

## 2020-12-02 ENCOUNTER — Encounter: Payer: Self-pay | Admitting: Internal Medicine

## 2020-12-02 VITALS — BP 135/64 | HR 66 | Temp 97.7°F | Resp 17 | Ht 64.0 in | Wt 154.0 lb

## 2020-12-02 DIAGNOSIS — R1013 Epigastric pain: Secondary | ICD-10-CM

## 2020-12-02 DIAGNOSIS — R1310 Dysphagia, unspecified: Secondary | ICD-10-CM | POA: Diagnosis not present

## 2020-12-02 DIAGNOSIS — K449 Diaphragmatic hernia without obstruction or gangrene: Secondary | ICD-10-CM | POA: Diagnosis not present

## 2020-12-02 DIAGNOSIS — K219 Gastro-esophageal reflux disease without esophagitis: Secondary | ICD-10-CM

## 2020-12-02 MED ORDER — SODIUM CHLORIDE 0.9 % IV SOLN: 500.0000 mL | Freq: Once | INTRAVENOUS | Status: DC

## 2020-12-02 NOTE — Patient Instructions (Signed)
YOU HAD AN ENDOSCOPIC PROCEDURE TODAY AT [facility]:   Refer to the procedure report that was given to you for any specific questions about what was found during the examination.  If the procedure report does not answer your questions, please call your gastroenterologist to clarify.  If you requested that your care partner not be given the details of your procedure findings, then the procedure report has been included in a sealed envelope for you to review at your convenience later.  YOU SHOULD EXPECT: Some feelings of bloating in the abdomen. Passage of more gas than usual.  Walking can help get rid of the air that was put into your GI tract during the procedure and reduce the bloating. If you had a lower endoscopy (such as a colonoscopy or flexible sigmoidoscopy) you may notice spotting of blood in your stool or on the toilet paper. If you underwent a bowel prep for your procedure, you may not have a normal bowel movement for a few days.  Please Note:  You might notice some irritation and congestion in your nose or some drainage.  This is from the oxygen used during your procedure.  There is no need for concern and it should clear up in a day or so.  SYMPTOMS TO REPORT IMMEDIATELY:   Following upper endoscopy (EGD)  Vomiting of blood or coffee ground material  New chest pain or pain under the shoulder blades  Painful or persistently difficult swallowing  New shortness of breath  Fever of 100F or higher  Black, tarry-looking stools  For urgent or emergent issues, a gastroenterologist can be reached at any hour by calling [phone]. Do not use MyChart messaging for urgent concerns.    DIET:  We do recommend a small meal at first, but then you may proceed to your regular diet.  Drink plenty of fluids but you should avoid alcoholic beverages for 24 hours. Follow an "Anti-reflux diet" (see  handout given to you by your recovery  nurse.  MEDICATIONS: Continue present  medications.  Follow Anti reflux precautions (see GERD handout).  FOLLOW UP: Schedule contrast-enhanced CT scan of the abdomen and pelvis (persistent epigastric pain". Dr. Blanch Media nurse will contact you to schedule this appointment and with subsequent results. You have been given "Ready-Cat" oral contrast  to take home for this scan. Dr. Blanch Media office nurse will go over instructions with you when she schedules the appointment.  Thank you for allowing [place] to provide for your healthcare needs today.  ACTIVITY:  You should plan to take it easy for the rest of today and you should NOT DRIVE or use heavy machinery until tomorrow (because of the sedation medicines used during the test).    FOLLOW UP: Our staff will call the number listed on your records 48-72 hours following your procedure to check on you and address any questions or concerns that you may have regarding the information given to you following your procedure. If we do not reach you, we will leave a message.  We will attempt to reach you two times.  During this call, we will ask if you have developed any symptoms of COVID 19. If you develop any symptoms (ie: fever, flu-like symptoms, shortness of breath, cough etc.) before then, please call [phone].  If you test positive for Covid 19 in the 2 weeks post procedure, please call and report this information to [place].    If any biopsies were taken you will be contacted by phone or  by letter within the next 1-3 weeks.  Please call us at [phone] if you have not heard about the biopsies in 3 weeks.    SIGNATURES/CONFIDENTIALITY: You and/or your care partner have signed paperwork which will be entered into your electronic medical record.  These signatures attest to the fact that that the information above on your After Visit Summary has been reviewed and is understood.  Full responsibility of the confidentiality of this discharge information lies with you and/or your care-partner.

## 2020-12-02 NOTE — Progress Notes (Signed)
A/ox3, pleased with MAC, report to RN 

## 2020-12-02 NOTE — Op Note (Signed)
[facility] Patient Name: Cassidy Norton Procedure Date: 12/02/2020 10:21 AM MRN: [id number] Endoscopist: Docia Chuck. Henrene Pastor , MD Age: 75 Referring MD:  Date of Birth: [birth year] Gender: Female Account #: [id number] Procedure:                Upper GI endoscopy Indications:              Epigastric abdominal pain, Esophageal reflux Medicines:                Monitored Anesthesia Care Procedure:                Pre-Anesthesia Assessment:                           - Prior to the procedure, a History and Physical                            was performed, and patient medications and                            allergies were reviewed. The patient's tolerance of                            previous anesthesia was also reviewed. The risks                            and benefits of the procedure and the sedation                            options and risks were discussed with the patient.                            All questions were answered, and informed consent                            was obtained. Prior Anticoagulants: The patient has                            taken no previous anticoagulant or antiplatelet                            agents. ASA Grade Assessment: II - A patient with                            mild systemic disease. After reviewing the risks                            and benefits, the patient was deemed in                            satisfactory condition to undergo the procedure.                           After obtaining informed consent, the endoscope was  passed under direct vision. Throughout the                            procedure, the patient's blood pressure, pulse, and                            oxygen saturations were monitored continuously. The                            Endoscope was introduced through the mouth, and                            advanced to the second part of duodenum. The upper                            GI  endoscopy was accomplished without difficulty.                            The patient tolerated the procedure well. Scope In: Scope Out: Findings:                 The esophagus was normal.                           The stomach was normal, save hiatal hernia.                           The examined duodenum was normal save some mild                            scarring from prior ulcer disease.                           The cardia and gastric fundus were normal on                            retroflexion. Complications:            No immediate complications. Estimated Blood Loss:     Estimated blood loss: none. Impression:               1. GERD.                           2. No significant abnormalities on EGD Recommendation:           1. Patient has a contact number available for                            emergencies. The signs and symptoms of potential                            delayed complications were discussed with the                            patient. Return to normal activities tomorrow.  Written discharge instructions were provided to the                            patient.                           2. Reflux precautions with attention to weight                            loss. Antireflux diet.                           3. Continue present medications.                           4. Schedule contrast-enhanced CT scan of the                            abdomen and pelvis "persistent epigastric pain". We                            will contact you with the results have returned Docia Chuck. Henrene Pastor, MD 12/02/2020 10:44:57 AM This report has been signed electronically.

## 2020-12-02 NOTE — Progress Notes (Signed)
Medical history reviewed, VS assessed by C.W 

## 2020-12-05 ENCOUNTER — Other Ambulatory Visit: Payer: Self-pay

## 2020-12-05 DIAGNOSIS — K219 Gastro-esophageal reflux disease without esophagitis: Secondary | ICD-10-CM

## 2020-12-05 DIAGNOSIS — R1013 Epigastric pain: Secondary | ICD-10-CM

## 2020-12-06 ENCOUNTER — Telehealth: Payer: Self-pay | Admitting: *Deleted

## 2020-12-06 NOTE — Telephone Encounter (Signed)
Follow up call made. 

## 2020-12-06 NOTE — Telephone Encounter (Signed)
  Follow up Call-  Call back number 12/02/2020  Post procedure Call Back phone  # [phone]  Permission to leave phone message Yes  Some recent data might be hidden     Patient questions:  Do you have a fever, pain , or abdominal swelling? No. Pain Score  0 *  Have you tolerated food without any problems? Yes.    Have you been able to return to your normal activities? Yes.    Do you have any questions about your discharge instructions: Diet   No. Medications  No. Follow up visit  No.  Do you have questions or concerns about your Care? No.  Actions: * If pain score is 4 or above: No action needed, pain <4.Have you developed a fever since your procedure? no  2.   Have you had an respiratory symptoms (SOB or cough) since your procedure? no  3.   Have you tested positive for COVID 19 since your procedure no  4.   Have you had any family members/close contacts diagnosed with the COVID 19 since your procedure?  no   If yes to any of these questions please route to Joylene John, RN and Joella Prince, RN

## 2020-12-09 ENCOUNTER — Other Ambulatory Visit: Payer: Self-pay

## 2020-12-09 ENCOUNTER — Ambulatory Visit (HOSPITAL_COMMUNITY)
Admission: RE | Admit: 2020-12-09 | Discharge: 2020-12-09 | Disposition: A | Discharge status: Home / Self Care | Payer: Medicare Other | Source: Ambulatory Visit | Attending: Internal Medicine | Admitting: Internal Medicine

## 2020-12-09 DIAGNOSIS — K760 Fatty (change of) liver, not elsewhere classified: Secondary | ICD-10-CM | POA: Diagnosis not present

## 2020-12-09 DIAGNOSIS — R1013 Epigastric pain: Secondary | ICD-10-CM | POA: Insufficient documentation

## 2020-12-09 DIAGNOSIS — K219 Gastro-esophageal reflux disease without esophagitis: Secondary | ICD-10-CM

## 2020-12-09 LAB — POCT I-STAT CREATININE: Creatinine, Ser: 0.6 mg/dL (ref 0.44–1.00)

## 2020-12-09 MED ORDER — IOHEXOL 350 MG/ML SOLN
100.0000 mL | Freq: Once | INTRAVENOUS | Status: AC | PRN
  Administered 2020-12-09: 80 mL via INTRAVENOUS

## 2020-12-12 NOTE — Progress Notes (Signed)
Patient called this morning, stating "her throat has been scratchy all weekend and feels as if something is stuck down deep in her throat".  Patient concerned this might be dye allergy reaction from her CT scan on Friday 12/09/20, advised patient needs to call Dr. Blanch Media office.

## 2020-12-15 ENCOUNTER — Telehealth: Payer: Self-pay | Admitting: Family Medicine

## 2020-12-15 NOTE — Telephone Encounter (Signed)
Tried calling pt to schedule Medicare Annual Wellness Visit (AWV) either virtually or in office.  No answer  Last AWV 07/07/19  please schedule at anytime with [facility] Nurse Health Advisor 1 or 2   This should be a 45 minute visit.

## 2020-12-23 DIAGNOSIS — H903 Sensorineural hearing loss, bilateral: Secondary | ICD-10-CM | POA: Diagnosis not present

## 2020-12-28 ENCOUNTER — Ambulatory Visit (INDEPENDENT_AMBULATORY_CARE_PROVIDER_SITE_OTHER): Payer: Medicare Other | Admitting: Nurse Practitioner

## 2020-12-28 ENCOUNTER — Other Ambulatory Visit: Payer: Self-pay

## 2020-12-28 ENCOUNTER — Encounter: Payer: Self-pay | Admitting: Nurse Practitioner

## 2020-12-28 VITALS — BP 122/70

## 2020-12-28 DIAGNOSIS — N898 Other specified noninflammatory disorders of vagina: Secondary | ICD-10-CM | POA: Diagnosis not present

## 2020-12-28 DIAGNOSIS — B373 Candidiasis of vulva and vagina: Secondary | ICD-10-CM

## 2020-12-28 DIAGNOSIS — R102 Pelvic and perineal pain: Secondary | ICD-10-CM | POA: Diagnosis not present

## 2020-12-28 DIAGNOSIS — B3731 Acute candidiasis of vulva and vagina: Secondary | ICD-10-CM

## 2020-12-28 DIAGNOSIS — R300 Dysuria: Secondary | ICD-10-CM | POA: Diagnosis not present

## 2020-12-28 LAB — WET PREP FOR TRICH, YEAST, CLUE

## 2020-12-28 MED ORDER — FLUCONAZOLE 150 MG PO TABS: 150.0000 mg | ORAL_TABLET | ORAL | 0 refills | Status: DC

## 2020-12-28 NOTE — Progress Notes (Signed)
   Acute Office Visit  Subjective:    [name], female    DOB: 1945-12-19, 75 y.o.   MRN: [id number]   HPI 75 y.o. presents today for lower abdominal pressure x 2 weeks. She feels the pressure when she first starts urinating and when she is sitting and leaning forward. Denies dysuria, frequency, urgency, or back pain. Denies vaginal symptoms. S/P TAH.    Review of Systems  Constitutional: Negative.   Genitourinary:  Positive for pelvic pain (pressure with urination). Negative for dysuria, frequency, hematuria, urgency, vaginal discharge and vaginal pain.      Objective:    Physical Exam Constitutional:      Appearance: Normal appearance.  Abdominal:     Palpations: Abdomen is soft.     Tenderness: There is no abdominal tenderness.  Genitourinary:    Vagina: Vaginal discharge present. No erythema.     Uterus: Absent.      Comments: Mild redness and atrophic changes   BP 122/70  Wt Readings from Last 3 Encounters:  12/02/20 154 lb (69.9 kg)  09/15/20 154 lb 2 oz (69.9 kg)  07/18/20 154 lb (69.9 kg)   UA trace leukocytes, negative nitrates, negative blood, SG 1.004, Clear yellow. Microscopic: wbc 0-5, no rbcs, no bacteria  Wet prep + yeast     Assessment & Plan:   Problem List Items Addressed This Visit   None Visit Diagnoses     Vulvovaginal candidiasis    -  Primary   Relevant Medications   fluconazole (DIFLUCAN) 150 MG tablet   Vaginal irritation       Relevant Orders   WET PREP FOR TRICH, YEAST, CLUE   Pelvic pressure in female       Relevant Orders   Urinalysis,Complete w/RFL Culture (Completed)   Urine Culture   REFLEXIVE URINE CULTURE (Completed)      Plan: Wet prep positive for yeast - Diflucan 150 mg today and repeat in 3 days for total of 2 doses. Reassurance provided on negative UA. If symptoms worsen or do not improve she will return to office.      Tamela Gammon DNP, 2:28 PM 12/28/2020

## 2020-12-29 ENCOUNTER — Other Ambulatory Visit: Payer: Self-pay

## 2020-12-30 ENCOUNTER — Ambulatory Visit (INDEPENDENT_AMBULATORY_CARE_PROVIDER_SITE_OTHER): Payer: Medicare Other | Admitting: Family Medicine

## 2020-12-30 ENCOUNTER — Encounter: Payer: Self-pay | Admitting: Family Medicine

## 2020-12-30 VITALS — BP 120/60 | HR 70 | Temp 97.9°F | Wt 157.5 lb

## 2020-12-30 DIAGNOSIS — R5383 Other fatigue: Secondary | ICD-10-CM

## 2020-12-30 DIAGNOSIS — E038 Other specified hypothyroidism: Secondary | ICD-10-CM | POA: Diagnosis not present

## 2020-12-30 DIAGNOSIS — E538 Deficiency of other specified B group vitamins: Secondary | ICD-10-CM

## 2020-12-30 LAB — CBC WITH DIFFERENTIAL/PLATELET
Basophils Absolute: 0 10*3/uL (ref 0.0–0.1)
Basophils Relative: 0.4 % (ref 0.0–3.0)
Eosinophils Absolute: 0.2 10*3/uL (ref 0.0–0.7)
Eosinophils Relative: 2.3 % (ref 0.0–5.0)
HCT: 39.6 % (ref 36.0–46.0)
Hemoglobin: 13.3 g/dL (ref 12.0–15.0)
Lymphocytes Relative: 38.6 % (ref 12.0–46.0)
Lymphs Abs: 3.5 10*3/uL (ref 0.7–4.0)
MCHC: 33.6 g/dL (ref 30.0–36.0)
MCV: 95 fl (ref 78.0–100.0)
Monocytes Absolute: 0.7 10*3/uL (ref 0.1–1.0)
Monocytes Relative: 7.4 % (ref 3.0–12.0)
Neutro Abs: 4.7 10*3/uL (ref 1.4–7.7)
Neutrophils Relative %: 51.3 % (ref 43.0–77.0)
Platelets: 211 10*3/uL (ref 150.0–400.0)
RBC: 4.17 Mil/uL (ref 3.87–5.11)
RDW: 13.8 % (ref 11.5–15.5)
WBC: 9.2 10*3/uL (ref 4.0–10.5)

## 2020-12-30 LAB — URINE CULTURE
MICRO NUMBER:: 12255021
Result:: NO GROWTH
SPECIMEN QUALITY:: ADEQUATE

## 2020-12-30 LAB — URINALYSIS, COMPLETE W/RFL CULTURE
Bacteria, UA: NONE SEEN /HPF
Bilirubin Urine: NEGATIVE
Glucose, UA: NEGATIVE
Hgb urine dipstick: NEGATIVE
Hyaline Cast: NONE SEEN /LPF
Ketones, ur: NEGATIVE
Nitrites, Initial: NEGATIVE
Protein, ur: NEGATIVE
RBC / HPF: NONE SEEN /HPF (ref 0–2)
Specific Gravity, Urine: 1.004 (ref 1.001–1.035)
pH: 6 (ref 5.0–8.0)

## 2020-12-30 LAB — VITAMIN B12: Vitamin B-12: 1069 pg/mL — ABNORMAL HIGH (ref 211–911)

## 2020-12-30 LAB — TSH: TSH: 17.03 u[IU]/mL — ABNORMAL HIGH (ref 0.35–5.50)

## 2020-12-30 LAB — BASIC METABOLIC PANEL
BUN: 9 mg/dL (ref 6–23)
CO2: 25 mEq/L (ref 19–32)
Calcium: 9.2 mg/dL (ref 8.4–10.5)
Chloride: 103 mEq/L (ref 96–112)
Creatinine, Ser: 0.65 mg/dL (ref 0.40–1.20)
GFR: 84.94 mL/min (ref >60.00)
Glucose, Bld: 104 mg/dL — ABNORMAL HIGH (ref 70–99)
Potassium: 3.8 mEq/L (ref 3.5–5.1)
Sodium: 140 mEq/L (ref 135–145)

## 2020-12-30 LAB — CULTURE INDICATED

## 2020-12-30 MED ORDER — AMOXICILLIN-POT CLAVULANATE 875-125 MG PO TABS: 1.0000 | ORAL_TABLET | Freq: Two times a day (BID) | ORAL | 0 refills | Status: DC

## 2020-12-30 MED ORDER — PREDNISONE 10 MG PO TABS: ORAL_TABLET | ORAL | 0 refills | Status: DC

## 2020-12-30 NOTE — Progress Notes (Signed)
Established Patient Office Visit  Subjective:  [name], female    DOB: 1945-12-27  Age: 75 y.o. MRN: [id number]  CC:  Chief Complaint  Patient presents with   Nasal Congestion    X 2 weeks, using otc allergy meds, pt states sometimes she feels like she's ina daze and would like B12 checked.     HPI Cassidy Norton presents for the following issues  Sinus congestive issues for about 3 weeks now.  She had significant nasal stuffiness and thick colored nasal discharge.  She had some intermittent headaches.  She had some left frontal and left maxillary sinus pressure and pain intermittently.  She tried over-the-counter generic Claritin without improvement.  Generally does not suffer allergies this time a year.  She had some excessive fatigue issues.  She has history of low B12 and is currently on replacement.  Requesting follow-up levels.  She has hypothyroidism and takes replacement and has not had labs and about a year and a half for her thyroid.  Her husband passed away from dementia complications this past April.  She is handling that fairly well.  He had been in a nursing home at the time of his death.  Patient denies any chest pains or dyspnea.  No recent appetite or weight changes.  No fever.  No sick contacts.  Past Medical History:  Diagnosis Date   Abdominal pain, epigastric 08/22/2009   Adenomatous colon polyp    Chronic low back pain    Diverticulosis    Duodenal ulcer    GERD (gastroesophageal reflux disease)    Hemorrhoids    Hiatal hernia    HYPOTHYROIDISM 07/20/2008   Insomnia    Keratosis 09/11/2020   Right Hand   NSVD (normal spontaneous vaginal delivery)    X2    Past Surgical History:  Procedure Laterality Date   ABDOMINAL HYSTERECTOMY     TVH   BACK SURGERY     RUPTURED DISC   BLEPHAROPLASTY     CHOLECYSTECTOMY     COLONOSCOPY  JAN 2014   NEXT COLONOSCOPY IN 10 YEARS   DILATION AND CURETTAGE OF UTERUS     EYE SURGERY  2008    BILATERAL CATARACT    SHOULDER SURGERY Right 05/20/2020   [facility] Dr Onnie Graham    Family History  Problem Relation Age of Onset   Cancer Mother        bladder CA   Diabetes Mother    Cancer Father        gallblader CA   Heart disease Father    Diabetes Sister    Hypertension Sister    Diabetes Brother    Hypertension Brother    Heart disease Brother    Heart disease Daughter    Stomach cancer Neg Hx    Colon cancer Neg Hx     Social History   Socioeconomic History   Marital status: Widowed    Spouse name: Not on file   Number of children: 1   Years of education: Not on file   Highest education level: Not on file  Occupational History   Occupation: Retired    Fish farm manager: RETIRED  Tobacco Use   Smoking status: Former    Packs/day: 1.50    Years: 40.00    Pack years: 60.00    Types: Cigarettes    Quit date: 08/28/2003    Years since quitting: 17.3   Smokeless tobacco: Never  Vaping Use   Vaping Use: Never  used  Substance and Sexual Activity   Alcohol use: No    Alcohol/week: 0.0 standard drinks   Drug use: No   Sexual activity: Yes    Birth control/protection: Surgical    Comment: HYSTERECTOMY  Other Topics Concern   Not on file  Social History Narrative   Not on file   Social Determinants of Health   Financial Resource Strain: Not on file  Food Insecurity: Not on file  Transportation Needs: Not on file  Physical Activity: Not on file  Stress: Not on file  Social Connections: Not on file  Intimate Partner Violence: Not on file    Outpatient Medications Prior to Visit  Medication Sig Dispense Refill   acetaminophen (TYLENOL) 650 MG CR tablet Take 1,300 mg by mouth daily.      cycloSPORINE (RESTASIS) 0.05 % ophthalmic emulsion Place 1 drop into the left eye 2 (two) times daily.     diclofenac (VOLTAREN) 75 MG EC tablet diclofenac sodium 75 mg tablet,delayed release  TAKE 1 TABLET BY MOUTH TWICE A DAY     estradiol (ESTRACE) 0.5 MG tablet TAKE  ONE-HALF (1/2) TABLET EVERY OTHER DAY 25 tablet 3   fluconazole (DIFLUCAN) 150 MG tablet Take 1 tablet (150 mg total) by mouth every 3 (three) days. 2 tablet 0   HYDROcodone-acetaminophen (NORCO) 10-325 MG per tablet Take 0.5 tablets by mouth every 6 (six) hours as needed.      levothyroxine (SYNTHROID) 100 MCG tablet TAKE 1 TABLET DAILY 90 tablet 0   lidocaine-hydrocortisone (ANAMANTLE) 3-1 % KIT Place 1 application rectally 2 (two) times daily. 1 kit 1   Multiple Vitamins-Minerals (PRESERVISION AREDS 2 PO) Take 2 tablets by mouth daily in the afternoon.     omeprazole (PRILOSEC) 40 MG capsule Take 1 capsule (40 mg total) by mouth 2 (two) times daily. Must keep upcoming office visit for further refills 180 capsule 3   polyethylene glycol (MIRALAX / GLYCOLAX) packet Take 17 g by mouth daily as needed.      pregabalin (LYRICA) 150 MG capsule Take 1 capsule (150 mg total) by mouth 2 (two) times daily. 180 capsule 3   Propylene Glycol (SYSTANE COMPLETE OP) Apply to eye at bedtime.      Psyllium 30.9 % POWD Take by mouth. One tsp daily at bedtime     sucralfate (CARAFATE) 1 g tablet Take 1 tablet (1 g total) by mouth 3 (three) times daily between meals. 360 tablet 3   vitamin B-12 (CYANOCOBALAMIN) 1000 MCG tablet Take 1,000 mcg by mouth daily.     Facility-Administered Medications Prior to Visit  Medication Dose Route Frequency Provider Last Rate Last Admin   0.9 %  sodium chloride infusion  500 mL Intravenous Once Irene Shipper, MD        Allergies  Allergen Reactions   Aspirin     REACTION: Hives    ROS Review of Systems  Constitutional:  Positive for fatigue. Negative for chills and fever.  HENT:  Positive for congestion, sinus pressure and sinus pain.   Respiratory:  Negative for cough and shortness of breath.   Cardiovascular:  Negative for chest pain and leg swelling.  Neurological:  Positive for headaches.     Objective:    Physical Exam Vitals reviewed.  Constitutional:       Appearance: Normal appearance.  HENT:     Right Ear: Tympanic membrane normal.     Left Ear: Tympanic membrane normal.     Nose:  Comments: He has some nonspecific nasal mucosal swelling bilaterally.  No erythema.  No visible purulent secretions.  No visible polyps.    Mouth/Throat:     Mouth: Mucous membranes are moist.     Pharynx: Oropharynx is clear.  Cardiovascular:     Rate and Rhythm: Normal rate and regular rhythm.  Pulmonary:     Effort: Pulmonary effort is normal.     Breath sounds: Normal breath sounds.  Musculoskeletal:     Cervical back: Neck supple.  Lymphadenopathy:     Cervical: No cervical adenopathy.  Neurological:     Mental Status: She is alert.    BP 120/60 (BP Location: Left Arm, Patient Position: Sitting, Cuff Size: Normal)   Pulse 70   Temp 97.9 F (36.6 C) (Oral)   Wt 157 lb 8 oz (71.4 kg)   SpO2 94%   BMI 27.03 kg/m  Wt Readings from Last 3 Encounters:  12/30/20 157 lb 8 oz (71.4 kg)  12/02/20 154 lb (69.9 kg)  09/15/20 154 lb 2 oz (69.9 kg)     Health Maintenance Due  Topic Date Due   COVID-19 Vaccine (1) Never done   Zoster Vaccines- Shingrix (1 of 2) Never done   INFLUENZA VACCINE  12/12/2020    There are no preventive care reminders to display for this patient.  Lab Results  Component Value Date   TSH 2.93 06/11/2019   Lab Results  Component Value Date   WBC 8.3 08/22/2016   HGB 13.8 08/22/2016   HCT 38.9 08/22/2016   MCV 94.5 08/22/2016   PLT 221.0 08/22/2016   Lab Results  Component Value Date   NA 141 08/22/2016   K 3.9 08/22/2016   CO2 28 08/22/2016   GLUCOSE 108 (H) 08/22/2016   BUN 7 08/22/2016   CREATININE 0.60 12/09/2020   BILITOT 0.4 08/22/2016   ALKPHOS 80 08/22/2016   AST 16 08/22/2016   ALT 15 08/22/2016   PROT 6.5 05/08/2017   ALBUMIN 4.2 08/22/2016   CALCIUM 9.3 08/22/2016   GFR 104.14 08/22/2016   Lab Results  Component Value Date   CHOL 147 07/27/2013   Lab Results  Component Value Date    HDL 35.00 (L) 07/27/2013   Lab Results  Component Value Date   LDLCALC 79 07/27/2013   Lab Results  Component Value Date   TRIG 165.0 (H) 07/27/2013   Lab Results  Component Value Date   CHOLHDL 4 07/27/2013   Lab Results  Component Value Date   HGBA1C 5.6 05/08/2017      Assessment & Plan:   #1 probable acute sinusitis.  She has had several weeks of symptoms develop.  With prior history of allergy issues this time a year  -Start Augmentin 875 mg twice daily for 10 days -We elected to do a brief taper of prednisone with her increased nasal congestion symptoms and reviewed potential side effects  #2 hypothyroidism-on replacement with levothyroxine -Recheck TSH  #3 history of B12 deficiency -Patient requesting B12 level  #4 fatigue probably related to 1.  We will check additional labs including CBC, TSH, P71, basic metabolic panel.   Meds ordered this encounter  Medications   amoxicillin-clavulanate (AUGMENTIN) 875-125 MG tablet    Sig: Take 1 tablet by mouth 2 (two) times daily.    Dispense:  20 tablet    Refill:  0   predniSONE (DELTASONE) 10 MG tablet    Sig: Taper as follows: 4-4-3-3-2-2-1-1    Dispense:  20 tablet  Refill:  0    Follow-up: No follow-ups on file.    Carolann Littler, MD

## 2020-12-30 NOTE — Addendum Note (Signed)
Addended by: Amanda Cockayne on: 12/30/2020 10:13 AM   Modules accepted: Orders

## 2021-01-02 ENCOUNTER — Other Ambulatory Visit: Payer: Self-pay

## 2021-01-02 ENCOUNTER — Telehealth: Payer: Self-pay | Admitting: *Deleted

## 2021-01-02 DIAGNOSIS — E038 Other specified hypothyroidism: Secondary | ICD-10-CM

## 2021-01-02 MED ORDER — LEVOTHYROXINE SODIUM 112 MCG PO TABS: 112.0000 ug | ORAL_TABLET | Freq: Every day | ORAL | 3 refills | Status: DC

## 2021-01-02 NOTE — Telephone Encounter (Signed)
Patient called was treated for UTI on 11/25/20, then seen on 12/28/20 treated for yeast by Marny Lowenstein, NP. Patient called today stating she is still not better. She reports having pressure feeling when her bladder is full which causes discomfort, reports once she goes to the bathroom, the pressure feeling goes away. She is not having any vaginal itching, odor, discharge, burning with urination. Reports her urine looks clear and fine, recently had labs done at PCP office and was told her kidney function results look fine. Tiffany did check urine last week and it was normal. Please advise

## 2021-01-03 NOTE — Telephone Encounter (Signed)
Patient called back today in follow up to yesterday's call.  I printed out the call and placed on Dr. Mariah Milling desk. I did advise patient that Dr. Marguerita Merles in surgery today and not scheduled in until 3pm but we will call her as soon as we get Dr. Mariah Milling recommendation.

## 2021-01-03 NOTE — Telephone Encounter (Signed)
Per Dr. Marguerita Merles "Please schedule a visit with me on Thursday.  "  I spoke with patient and informed her. Message sent to appt desk to call and schedule appt.

## 2021-01-05 ENCOUNTER — Encounter: Payer: Self-pay | Admitting: Obstetrics & Gynecology

## 2021-01-05 ENCOUNTER — Other Ambulatory Visit: Payer: Self-pay

## 2021-01-05 ENCOUNTER — Ambulatory Visit (INDEPENDENT_AMBULATORY_CARE_PROVIDER_SITE_OTHER): Payer: Medicare Other | Admitting: Obstetrics & Gynecology

## 2021-01-05 VITALS — BP 110/70 | Temp 98.1°F

## 2021-01-05 DIAGNOSIS — N8110 Cystocele, unspecified: Secondary | ICD-10-CM | POA: Diagnosis not present

## 2021-01-05 DIAGNOSIS — R102 Pelvic and perineal pain: Secondary | ICD-10-CM | POA: Diagnosis not present

## 2021-01-05 LAB — URINALYSIS, COMPLETE W/RFL CULTURE
Bacteria, UA: NONE SEEN /HPF
Bilirubin Urine: NEGATIVE
Casts: NONE SEEN /LPF
Crystals: NONE SEEN /HPF
Glucose, UA: NEGATIVE
Hgb urine dipstick: NEGATIVE
Hyaline Cast: NONE SEEN /LPF
Ketones, ur: NEGATIVE
Leukocyte Esterase: NEGATIVE
Nitrites, Initial: NEGATIVE
Protein, ur: NEGATIVE
RBC / HPF: NONE SEEN /HPF (ref 0–2)
Specific Gravity, Urine: 1.003 (ref 1.001–1.035)
WBC, UA: NONE SEEN /HPF (ref 0–5)
Yeast: NONE SEEN /HPF
pH: 6 (ref 5.0–8.0)

## 2021-01-05 LAB — NO CULTURE INDICATED

## 2021-01-05 NOTE — Progress Notes (Signed)
    Cassidy Norton [birth year] [id number]        75 y.o.  [id number]   RP: Pressure sensation just prior to passing urine  HPI: S/P Total Hysterectomy with Cystocele repair many years ago.  Vaginal pressure and some urgency of urination.  U. Culture No growth 12/28/2020.  No blood in urine.  No fever.     OB History  Gravida Para Term Preterm AB Living  '3 2 2   1 2  '$ SAB IAB Ectopic Multiple Live Births  1       2    # Outcome Date GA Lbr Len/2nd Weight Sex Delivery Anes PTL Lv  3 SAB           2 Term     F Vag-Spont  N LIV  1 Term     M Vag-Spont  N LIV     Birth Comments: has died in a car accident at 89 yrs    Past medical history,surgical history, problem list, medications, allergies, family history and social history were all reviewed and documented in the EPIC chart.   Directed ROS with pertinent positives and negatives documented in the history of present illness/assessment and plan.  Exam:  Vitals:   01/05/21 1001  BP: 110/70  Temp: 98.1 F (36.7 C)  TempSrc: Oral   General appearance:  Normal  Abdomen: Normal  Gynecologic exam: Vulva normal.  Bimanual exam:  No pelvic mass, NT.  Standing with Valsalva:  Cystocele grade 1/4.  No Colpocele, no Rectocele.  U/A today:  Completely Negative U. Culture 12/28/2020 No growth   Assessment/Plan:  75 y.o. G3P2012   1. Pelvic pressure in female Vaginal pressure just prior and with urination.  Completely negative urine analysis today and urine culture was showing no growth December 28, 2020.  Vaginal pressure probably associated with a small grade 1/4 cystocele.  Counseling on small cystocele done.  Patient reassured. - Urinalysis,Complete w/RFL Culture   2. Baden-Walker grade 1 cystocele  Small mildly symptomatic cystocele grade 1/4.  Counseling done on management to prevent progression.  Recommended to empty her bladder regularly.  Patient may have a component of urgency, recommend lowering caffeine products as much as  possible.  Avoid pelvic floor pressure when exercising, avoid lifting heavy weights and treat constipation.  Recommend Kegel exercises.  Will observe, do not recommend pessary or surgical management at this time.  Patient voiced understanding and agreement with plan.  Princess Bruins MD, 10:24 AM 01/05/2021

## 2021-01-18 ENCOUNTER — Ambulatory Visit: Payer: Medicare Other | Admitting: Obstetrics & Gynecology

## 2021-01-18 ENCOUNTER — Other Ambulatory Visit: Payer: Self-pay | Admitting: Physician Assistant

## 2021-01-18 ENCOUNTER — Telehealth: Payer: Self-pay

## 2021-01-18 NOTE — Telephone Encounter (Signed)
Patient said the problems she was experiencing at her 01/05/21 visit with you regarding "my bladder or kidney problem" continues. She thinks she needs referral to urologist and questions if you will give her referral or should she come back in the office.

## 2021-01-23 NOTE — Telephone Encounter (Signed)
Referral request faxed to [facility]. Patient informed it was faxed and confirmation of receipt confirmed. I let her know that they will contact her to schedule appt but she is also welcome to call herself and let them know referral has been sent and schedule her appt.

## 2021-02-02 ENCOUNTER — Telehealth: Payer: Self-pay | Admitting: Family Medicine

## 2021-02-02 NOTE — Telephone Encounter (Signed)
Left message for patient to call back and schedule Medicare Annual Wellness Visit (AWV) either virtually or in office. Left  my Herbie Drape number [phone]   Last AWV 07/07/19 please schedule at anytime with [facility] Nurse Health Advisor 1 or 2   This should be a 45 minute visit.

## 2021-02-06 ENCOUNTER — Encounter: Payer: Self-pay | Admitting: Family Medicine

## 2021-02-06 DIAGNOSIS — H353131 Nonexudative age-related macular degeneration, bilateral, early dry stage: Secondary | ICD-10-CM | POA: Diagnosis not present

## 2021-02-06 DIAGNOSIS — H35033 Hypertensive retinopathy, bilateral: Secondary | ICD-10-CM | POA: Diagnosis not present

## 2021-02-06 DIAGNOSIS — H18593 Other hereditary corneal dystrophies, bilateral: Secondary | ICD-10-CM | POA: Diagnosis not present

## 2021-02-06 DIAGNOSIS — H04123 Dry eye syndrome of bilateral lacrimal glands: Secondary | ICD-10-CM | POA: Diagnosis not present

## 2021-02-10 DIAGNOSIS — M7061 Trochanteric bursitis, right hip: Secondary | ICD-10-CM | POA: Diagnosis not present

## 2021-02-10 DIAGNOSIS — M7631 Iliotibial band syndrome, right leg: Secondary | ICD-10-CM | POA: Diagnosis not present

## 2021-02-17 ENCOUNTER — Telehealth: Payer: Self-pay | Admitting: Family Medicine

## 2021-02-17 NOTE — Telephone Encounter (Signed)
Please advise. Looks like she has gotten both pneumonia vaccines.

## 2021-02-17 NOTE — Telephone Encounter (Signed)
Patient is interested in getting a pneumonia shot and a flu shot.  She doesn't remember if she is due a pneumonia shot.

## 2021-02-17 NOTE — Telephone Encounter (Signed)
Spoke with the patient. She is aware of Dr. Erick Blinks message and a nurse appointment has been made for her flu shot.

## 2021-02-20 ENCOUNTER — Other Ambulatory Visit: Payer: Self-pay

## 2021-02-20 ENCOUNTER — Ambulatory Visit (INDEPENDENT_AMBULATORY_CARE_PROVIDER_SITE_OTHER): Payer: Medicare Other

## 2021-02-20 DIAGNOSIS — Z23 Encounter for immunization: Secondary | ICD-10-CM

## 2021-02-27 DIAGNOSIS — N3281 Overactive bladder: Secondary | ICD-10-CM | POA: Diagnosis not present

## 2021-03-06 DIAGNOSIS — N3281 Overactive bladder: Secondary | ICD-10-CM | POA: Diagnosis not present

## 2021-03-06 DIAGNOSIS — R8279 Other abnormal findings on microbiological examination of urine: Secondary | ICD-10-CM | POA: Diagnosis not present

## 2021-03-14 ENCOUNTER — Ambulatory Visit (INDEPENDENT_AMBULATORY_CARE_PROVIDER_SITE_OTHER): Payer: Medicare Other | Admitting: Dermatology

## 2021-03-14 ENCOUNTER — Other Ambulatory Visit: Payer: Self-pay

## 2021-03-14 ENCOUNTER — Encounter: Payer: Self-pay | Admitting: Dermatology

## 2021-03-14 DIAGNOSIS — L821 Other seborrheic keratosis: Secondary | ICD-10-CM

## 2021-03-14 DIAGNOSIS — L565 Disseminated superficial actinic porokeratosis (DSAP): Secondary | ICD-10-CM

## 2021-03-14 DIAGNOSIS — L820 Inflamed seborrheic keratosis: Secondary | ICD-10-CM | POA: Diagnosis not present

## 2021-03-14 DIAGNOSIS — I872 Venous insufficiency (chronic) (peripheral): Secondary | ICD-10-CM

## 2021-03-14 MED ORDER — CLOBETASOL PROP EMOLLIENT BASE 0.05 % EX CREA: 1.0000 "application " | TOPICAL_CREAM | Freq: Two times a day (BID) | CUTANEOUS | 0 refills | Status: DC

## 2021-03-21 ENCOUNTER — Telehealth: Payer: Self-pay | Admitting: Dermatology

## 2021-03-21 NOTE — Telephone Encounter (Signed)
Phone call to patient to to give her clarification on when to use the clobetasol. Information given to the patient on when and how to use the clobetasol cream. Patient aware and understands.

## 2021-03-21 NOTE — Telephone Encounter (Signed)
Patient is calling because she cannot remember when and how to use cream that was prescribed.  She also stated that she was supposed to get another cream and that she could not remember when she was to order it.

## 2021-03-30 DIAGNOSIS — Z87440 Personal history of urinary (tract) infections: Secondary | ICD-10-CM | POA: Diagnosis not present

## 2021-03-30 DIAGNOSIS — N3281 Overactive bladder: Secondary | ICD-10-CM | POA: Diagnosis not present

## 2021-03-31 ENCOUNTER — Encounter: Payer: Self-pay | Admitting: Dermatology

## 2021-03-31 NOTE — Progress Notes (Signed)
   Follow-Up Visit   Subjective  Cassidy Norton is a 75 y.o. female who presents for the following: Skin Problem (Left lower leg x 1 month- red & itches).  Itchy rash on left lower leg and check several spots Location:  Duration:  Quality:  Associated Signs/Symptoms: Modifying Factors:  Severity:  Timing: Context:   Objective  Well appearing patient in no apparent distress; mood and affect are within normal limits. Left Lower Leg - Anterior Edema + dermatitis, negative Homans, no cords, intact but diminished pedal pulses, moderate edema  Right Forearm - Posterior 5 mm slightly inflamed flattopped tan papule, dermoscopy compatible with I-S K  Left Lower Leg - Anterior Noninflamed 6 mm tan flattopped textured papule  Left Lower Leg - Anterior Multiple 5 to 6 mm sharp edged pink flattopped keratotic papules    All sun exposed areas plus back examined.  And legs and vascular examination.   Assessment & Plan    Venous stasis dermatitis of left lower extremity Left Lower Leg - Anterior  Discussed ways to minimize lower extremity venous pressure.  Try topical clobetasol ideally covered with a clean moist wrap for 30 minutes daily.  Follow-up by phone in 4 weeks.  Related Medications Clobetasol Prop Emollient Base (CLOBETASOL PROPIONATE E) 0.05 % emollient cream Apply 1 application topically 2 (two) times daily.  Inflamed seborrheic keratosis Right Forearm - Posterior  Leave if stable  Seborrheic keratosis Left Lower Leg - Anterior  No intervention indicated unless clinically changes  DSAP (disseminated superficial actinic porokeratosis) Left Lower Leg - Anterior  Patient told of small risk of nonmelanoma skin cancer so return if there is growth or bleeding      I, Lavonna Monarch, MD, have reviewed all documentation for this visit.  The documentation on 03/31/21 for the exam, diagnosis, procedures, and orders are all accurate and complete.

## 2021-04-19 ENCOUNTER — Telehealth: Payer: Self-pay | Admitting: Family Medicine

## 2021-04-19 NOTE — Telephone Encounter (Signed)
Left message for patient to call back and schedule Medicare Annual Wellness Visit (AWV) either virtually or in office. Left  my Herbie Drape number [phone]   Last AWV 07/07/19 please schedule at anytime with [facility] Nurse Health Advisor 1 or 2   This should be a 45 minute visit.

## 2021-04-24 DIAGNOSIS — N3281 Overactive bladder: Secondary | ICD-10-CM | POA: Diagnosis not present

## 2021-04-24 DIAGNOSIS — R8279 Other abnormal findings on microbiological examination of urine: Secondary | ICD-10-CM | POA: Diagnosis not present

## 2021-05-08 ENCOUNTER — Other Ambulatory Visit: Payer: Self-pay | Admitting: Obstetrics & Gynecology

## 2021-06-07 ENCOUNTER — Ambulatory Visit (INDEPENDENT_AMBULATORY_CARE_PROVIDER_SITE_OTHER): Payer: Medicare Other | Admitting: Family Medicine

## 2021-06-07 VITALS — BP 130/70 | HR 71 | Temp 97.5°F | Wt 153.1 lb

## 2021-06-07 DIAGNOSIS — E038 Other specified hypothyroidism: Secondary | ICD-10-CM | POA: Diagnosis not present

## 2021-06-07 DIAGNOSIS — R5383 Other fatigue: Secondary | ICD-10-CM | POA: Diagnosis not present

## 2021-06-07 DIAGNOSIS — G2581 Restless legs syndrome: Secondary | ICD-10-CM | POA: Diagnosis not present

## 2021-06-07 DIAGNOSIS — R209 Unspecified disturbances of skin sensation: Secondary | ICD-10-CM | POA: Diagnosis not present

## 2021-06-07 LAB — TSH: TSH: 2.81 u[IU]/mL (ref 0.35–5.50)

## 2021-06-07 LAB — T4, FREE: Free T4: 0.74 ng/dL (ref 0.60–1.60)

## 2021-06-07 NOTE — Progress Notes (Signed)
Established Patient Office Visit  Subjective:  [name], female    DOB: [birth year]  Age: 76 y.o. MRN: [id number]  CC:  Chief Complaint  Patient presents with   Cold Extremity    Both hands are constantly cold. X 2 months, hands feel like they're asleep    HPI Cassidy Norton presents for the following issues  She states that both hands are frequently cold especially over the past couple months.  She does have hypothyroidism and thyroid under replaced back in August with TSH of 17.  We did increase her levothyroxine at that point to 125 mcg daily.  She does appear to be taking this appropriately most days although sometimes she takes this within an hour or so eating.  She recently has felt like both hands are cold but she has not noted any Raynaud's or color changes.  Somewhat of a tingling sensation but she does have some chronic peripheral neuropathy.  Recent B12 level normal.  She is already on Lyrica twice daily.  Denies any painful neuropathy symptoms of the hands.  She sometimes wears gloves indoors but again has no Raynaud's.  Separate issue is that she has had some recent restless leg symptoms at night.  She feels a crawling sensation involving both legs which is relieved with walking and movement.  Symptoms present past couple weeks.  Recent hemoglobin normal.  No late the use of caffeine.  No history of known iron deficiency.  No alcohol use.   Past Medical History:  Diagnosis Date   Abdominal pain, epigastric 08/22/2009   Adenomatous colon polyp    Chronic low back pain    Diverticulosis    Duodenal ulcer    GERD (gastroesophageal reflux disease)    Hemorrhoids    Hiatal hernia    HYPOTHYROIDISM 07/20/2008   Insomnia    Keratosis 09/11/2020   Right Hand   NSVD (normal spontaneous vaginal delivery)    X2    Past Surgical History:  Procedure Laterality Date   ABDOMINAL HYSTERECTOMY     TVH   BACK SURGERY     RUPTURED DISC   BLEPHAROPLASTY      CHOLECYSTECTOMY     COLONOSCOPY  JAN 2014   NEXT COLONOSCOPY IN 10 YEARS   DILATION AND CURETTAGE OF UTERUS     EYE SURGERY  2008   BILATERAL CATARACT    SHOULDER SURGERY Right 05/20/2020   [facility] Dr Onnie Graham    Family History  Problem Relation Age of Onset   Cancer Mother        bladder CA   Diabetes Mother    Cancer Father        gallblader CA   Heart disease Father    Diabetes Sister    Hypertension Sister    Diabetes Brother    Hypertension Brother    Heart disease Brother    Heart disease Daughter    Stomach cancer Neg Hx    Colon cancer Neg Hx     Social History   Socioeconomic History   Marital status: Widowed    Spouse name: Not on file   Number of children: 1   Years of education: Not on file   Highest education level: Not on file  Occupational History   Occupation: Retired    Fish farm manager: RETIRED  Tobacco Use   Smoking status: Former    Packs/day: 1.50    Years: 40.00    Pack years: 60.00    Types:  Cigarettes    Quit date: 08/28/2003    Years since quitting: 17.7   Smokeless tobacco: Never  Vaping Use   Vaping Use: Never used  Substance and Sexual Activity   Alcohol use: No    Alcohol/week: 0.0 standard drinks   Drug use: No   Sexual activity: Not Currently    Birth control/protection: Surgical    Comment: HYSTERECTOMY  Other Topics Concern   Not on file  Social History Narrative   Not on file   Social Determinants of Health   Financial Resource Strain: Not on file  Food Insecurity: Not on file  Transportation Needs: Not on file  Physical Activity: Not on file  Stress: Not on file  Social Connections: Not on file  Intimate Partner Violence: Not on file    Outpatient Medications Prior to Visit  Medication Sig Dispense Refill   cycloSPORINE (RESTASIS) 0.05 % ophthalmic emulsion Place 1 drop into the left eye 2 (two) times daily.     estradiol (ESTRACE) 0.5 MG tablet TAKE ONE-HALF (1/2) TABLET EVERY OTHER DAY 25 tablet 3    HYDROcodone-acetaminophen (NORCO) 10-325 MG per tablet Take 0.5 tablets by mouth every 6 (six) hours as needed.     levothyroxine (SYNTHROID) 112 MCG tablet Take 1 tablet (112 mcg total) by mouth daily. 90 tablet 3   mirabegron ER (MYRBETRIQ) 25 MG TB24 tablet Take 25 mg by mouth daily.     Multiple Vitamins-Minerals (PRESERVISION AREDS 2 PO) Take 2 tablets by mouth daily in the afternoon.     omeprazole (PRILOSEC) 40 MG capsule Take 1 capsule (40 mg total) by mouth 2 (two) times daily. 180 capsule 2   polyethylene glycol (MIRALAX / GLYCOLAX) packet Take 17 g by mouth daily as needed.      pregabalin (LYRICA) 150 MG capsule Take 1 capsule (150 mg total) by mouth 2 (two) times daily. 180 capsule 3   Propylene Glycol (SYSTANE COMPLETE OP) Apply to eye at bedtime.      Psyllium 30.9 % POWD Take by mouth. One tsp daily at bedtime     sucralfate (CARAFATE) 1 g tablet Take 1 tablet (1 g total) by mouth 3 (three) times daily between meals. 360 tablet 3   vitamin B-12 (CYANOCOBALAMIN) 1000 MCG tablet Take 1,000 mcg by mouth daily.     amoxicillin-clavulanate (AUGMENTIN) 875-125 MG tablet Take 1 tablet by mouth 2 (two) times daily. 20 tablet 0   cephALEXin (KEFLEX) 500 MG capsule Take 500 mg by mouth 3 (three) times daily.     Clobetasol Prop Emollient Base (CLOBETASOL PROPIONATE E) 0.05 % emollient cream Apply 1 application topically 2 (two) times daily. 30 g 0   predniSONE (DELTASONE) 10 MG tablet Taper as follows: 4-4-3-3-2-2-1-1 20 tablet 0   Facility-Administered Medications Prior to Visit  Medication Dose Route Frequency Provider Last Rate Last Admin   0.9 %  sodium chloride infusion  500 mL Intravenous Once Irene Shipper, MD        Allergies  Allergen Reactions   Aspirin     REACTION: Hives    ROS Review of Systems  Constitutional:  Negative for appetite change and unexpected weight change.  Eyes:  Negative for visual disturbance.  Respiratory:  Negative for cough, chest tightness,  shortness of breath and wheezing.   Cardiovascular:  Negative for chest pain, palpitations and leg swelling.  Neurological:  Negative for dizziness, seizures, syncope, weakness, light-headedness and headaches.     Objective:    Physical Exam Vitals  reviewed.  Constitutional:      Appearance: Normal appearance.  Cardiovascular:     Rate and Rhythm: Normal rate and regular rhythm.     Comments: Hands are both warm to touch with excellent capillary refill.  She has excellent radial and ulnar pulses bilaterally. Pulmonary:     Effort: Pulmonary effort is normal.     Breath sounds: Normal breath sounds.  Musculoskeletal:     Right lower leg: No edema.     Left lower leg: No edema.  Neurological:     Mental Status: She is alert.    BP 130/70 (BP Location: Left Arm, Patient Position: Sitting, Cuff Size: Normal)    Pulse 71    Temp (!) 97.5 F (36.4 C) (Oral)    Wt 153 lb 1.6 oz (69.4 kg)    SpO2 97%    BMI 26.28 kg/m  Wt Readings from Last 3 Encounters:  06/07/21 153 lb 1.6 oz (69.4 kg)  12/30/20 157 lb 8 oz (71.4 kg)  12/02/20 154 lb (69.9 kg)     Health Maintenance Due  Topic Date Due   COVID-19 Vaccine (1) Never done   Zoster Vaccines- Shingrix (1 of 2) Never done    There are no preventive care reminders to display for this patient.  Lab Results  Component Value Date   TSH 17.03 (H) 12/30/2020   Lab Results  Component Value Date   WBC 9.2 12/30/2020   HGB 13.3 12/30/2020   HCT 39.6 12/30/2020   MCV 95.0 12/30/2020   PLT 211.0 12/30/2020   Lab Results  Component Value Date   NA 140 12/30/2020   K 3.8 12/30/2020   CO2 25 12/30/2020   GLUCOSE 104 (H) 12/30/2020   BUN 9 12/30/2020   CREATININE 0.65 12/30/2020   BILITOT 0.4 08/22/2016   ALKPHOS 80 08/22/2016   AST 16 08/22/2016   ALT 15 08/22/2016   PROT 6.5 05/08/2017   ALBUMIN 4.2 08/22/2016   CALCIUM 9.2 12/30/2020   GFR 84.94 12/30/2020   Lab Results  Component Value Date   CHOL 147 07/27/2013    Lab Results  Component Value Date   HDL 35.00 (L) 07/27/2013   Lab Results  Component Value Date   LDLCALC 79 07/27/2013   Lab Results  Component Value Date   TRIG 165.0 (H) 07/27/2013   Lab Results  Component Value Date   CHOLHDL 4 07/27/2013   Lab Results  Component Value Date   HGBA1C 5.6 05/08/2017      Assessment & Plan:   #1 sensation of cold hands bilaterally.  No history of Raynaud's.  She has excellent circulation by clinical exam. -Query whether some of this related her hypothyroidism and also possible neuropathy component.  Patient is non-smoker.  Smoked previously but quit around 2005 -Recheck thyroid functions as below  #2 hypothyroidism recently under replaced Recheck TSH and free T4  #3 probable restless leg syndrome.  Patient has had symptoms relatively short-lived for the past couple weeks.  Relatively mild at this time.  We discussed importance of avoidance of excessive caffeine.  No history of iron deficiency.  We discussed possible treatment options including Mirapex or Requip but at this point she will observe.  Handout given.   No orders of the defined types were placed in this encounter.   Follow-up: Return in about 3 months (around 09/05/2021).    Carolann Littler, MD

## 2021-06-21 ENCOUNTER — Encounter: Payer: Self-pay | Admitting: Dermatology

## 2021-06-21 ENCOUNTER — Ambulatory Visit (INDEPENDENT_AMBULATORY_CARE_PROVIDER_SITE_OTHER): Payer: Medicare Other | Admitting: Dermatology

## 2021-06-21 ENCOUNTER — Other Ambulatory Visit: Payer: Self-pay

## 2021-06-21 DIAGNOSIS — L738 Other specified follicular disorders: Secondary | ICD-10-CM

## 2021-06-21 DIAGNOSIS — L570 Actinic keratosis: Secondary | ICD-10-CM

## 2021-06-28 ENCOUNTER — Telehealth: Payer: Self-pay | Admitting: Family Medicine

## 2021-06-28 NOTE — Telephone Encounter (Signed)
Left message for patient to call back and schedule Medicare Annual Wellness Visit (AWV) either virtually or in office. Left  my Herbie Drape number [phone]   Last AWV 07/07/19  please schedule at anytime with [facility] Nurse Health Advisor 1 or 2   This should be a 45 minute visit.

## 2021-07-04 ENCOUNTER — Ambulatory Visit (INDEPENDENT_AMBULATORY_CARE_PROVIDER_SITE_OTHER): Payer: Medicare Other

## 2021-07-04 VITALS — Ht 64.0 in | Wt 153.0 lb

## 2021-07-04 DIAGNOSIS — Z0000 Encounter for general adult medical examination without abnormal findings: Secondary | ICD-10-CM

## 2021-07-04 NOTE — Progress Notes (Signed)
Subjective:   Cassidy Norton is a 76 y.o. female who presents for Medicare Annual (Subsequent) preventive examination.  Review of Systems    Virtual Visit via Telephone Note  I connected with  Cassidy Norton on 07/04/21 at  1:45 PM EST by telephone and verified that I am speaking with the correct person using two identifiers.  Location: Patient: Home Provider: Office Persons participating in the virtual visit: patient/Nurse Health Advisor   I discussed the limitations, risks, security and privacy concerns of performing an evaluation and management service by telephone and the availability of in person appointments. The patient expressed understanding and agreed to proceed.  Interactive audio and video telecommunications were attempted between this nurse and patient, however failed, due to patient having technical difficulties OR patient did not have access to video capability.  We continued and completed visit with audio only.  Some vital signs may be absent or patient reported.   Criselda Peaches, LPN  Cardiac Risk Factors include: advanced age (>49men, >109 women)     Objective:    Today's Vitals   07/04/21 1350  Weight: 153 lb (69.4 kg)  Height: 5\' 4"  (1.626 m)   Body mass index is 26.26 kg/m.  Advanced Directives 07/04/2021 10/09/2016  Does Patient Have a Medical Advance Directive? Yes Yes  Type of Paramedic of [place];Living will [place];Living will  Does patient want to make changes to medical advance directive? No - Patient declined -  Copy of [place] in Chart? No - copy requested -    Current Medications (verified) Outpatient Encounter Medications as of 07/04/2021  Medication Sig   cycloSPORINE (RESTASIS) 0.05 % ophthalmic emulsion Place 1 drop into the left eye 2 (two) times daily.   estradiol (ESTRACE) 0.5 MG tablet TAKE ONE-HALF (1/2) TABLET EVERY OTHER DAY   HYDROcodone-acetaminophen (NORCO)  10-325 MG per tablet Take 0.5 tablets by mouth every 6 (six) hours as needed.   levothyroxine (SYNTHROID) 112 MCG tablet Take 1 tablet (112 mcg total) by mouth daily.   mirabegron ER (MYRBETRIQ) 25 MG TB24 tablet Take 25 mg by mouth daily.   Multiple Vitamins-Minerals (PRESERVISION AREDS 2 PO) Take 2 tablets by mouth daily in the afternoon.   omeprazole (PRILOSEC) 40 MG capsule Take 1 capsule (40 mg total) by mouth 2 (two) times daily.   polyethylene glycol (MIRALAX / GLYCOLAX) packet Take 17 g by mouth daily as needed.    pregabalin (LYRICA) 150 MG capsule Take 1 capsule (150 mg total) by mouth 2 (two) times daily.   Propylene Glycol (SYSTANE COMPLETE OP) Apply to eye at bedtime.    Psyllium 30.9 % POWD Take by mouth. One tsp daily at bedtime   sucralfate (CARAFATE) 1 g tablet Take 1 tablet (1 g total) by mouth 3 (three) times daily between meals.   vitamin B-12 (CYANOCOBALAMIN) 1000 MCG tablet Take 1,000 mcg by mouth daily.   Facility-Administered Encounter Medications as of 07/04/2021  Medication   0.9 %  sodium chloride infusion    Allergies (verified) Aspirin   History: Past Medical History:  Diagnosis Date   Abdominal pain, epigastric 08/22/2009   Adenomatous colon polyp    Chronic low back pain    Diverticulosis    Duodenal ulcer    GERD (gastroesophageal reflux disease)    Hemorrhoids    Hiatal hernia    HYPOTHYROIDISM 07/20/2008   Insomnia    Keratosis 09/11/2020   Right Hand   NSVD (  normal spontaneous vaginal delivery)    X2   Past Surgical History:  Procedure Laterality Date   ABDOMINAL HYSTERECTOMY     TVH   BACK SURGERY     RUPTURED DISC   BLEPHAROPLASTY     CHOLECYSTECTOMY     COLONOSCOPY  JAN 2014   NEXT COLONOSCOPY IN 10 YEARS   DILATION AND CURETTAGE OF UTERUS     EYE SURGERY  2008   BILATERAL CATARACT    SHOULDER SURGERY Right 05/20/2020   [facility] Dr Onnie Graham   Family History  Problem Relation Age of Onset   Cancer Mother        bladder  CA   Diabetes Mother    Cancer Father        gallblader CA   Heart disease Father    Diabetes Sister    Hypertension Sister    Diabetes Brother    Hypertension Brother    Heart disease Brother    Heart disease Daughter    Stomach cancer Neg Hx    Colon cancer Neg Hx    Social History   Socioeconomic History   Marital status: Widowed    Spouse name: Not on file   Number of children: 1   Years of education: Not on file   Highest education level: Not on file  Occupational History   Occupation: Retired    Fish farm manager: RETIRED  Tobacco Use   Smoking status: Former    Packs/day: 1.50    Years: 40.00    Pack years: 60.00    Types: Cigarettes    Quit date: 08/28/2003    Years since quitting: 17.8   Smokeless tobacco: Never  Vaping Use   Vaping Use: Never used  Substance and Sexual Activity   Alcohol use: No    Alcohol/week: 0.0 standard drinks   Drug use: No   Sexual activity: Not Currently    Birth control/protection: Surgical    Comment: HYSTERECTOMY  Other Topics Concern   Not on file  Social History Narrative   Not on file   Social Determinants of Health   Financial Resource Strain: Low Risk    Difficulty of Paying Living Expenses: Not hard at all  Food Insecurity: No Food Insecurity   Worried About Charity fundraiser in the Last Year: Never true   [place] in the Last Year: Never true  Transportation Needs: No Transportation Needs   Lack of Transportation (Medical): No   Lack of Transportation (Non-Medical): No  Physical Activity: Sufficiently Active   Days of Exercise per Week: 6 days   Minutes of Exercise per Session: 30 min  Stress: Not on file  Social Connections: Moderately Integrated   Frequency of Communication with Friends and Family: More than three times a week   Frequency of Social Gatherings with Friends and Family: More than three times a week   Attends Religious Services: More than 4 times per year   Active Member of [employer] or  Organizations: Yes   Attends Archivist Meetings: Never   Marital Status: Widowed     Clinical Intake:  Pre-visit preparation completed: YesHow often do you need to have someone help you when you read instructions, pamphlets, or other written materials from your doctor or pharmacy?: 1 - Never  Diabetic? No    Activities of Daily Living In your present state of health, do you have any difficulty performing the following activities: 07/04/2021  Hearing? N  Vision? N  Difficulty concentrating  or making decisions? N  Walking or climbing stairs? N  Dressing or bathing? N  Doing errands, shopping? N  Preparing Food and eating ? N  Using the Toilet? N  In the past six months, have you accidently leaked urine? Y  Comment Followed by Urologist  Do you have problems with loss of bowel control? N  Managing your Medications? N  Managing your Finances? N  Some recent data might be hidden    Patient Care Team: Eulas Post, MD as PCP - General Princess Bruins, MD as Consulting Physician (Obstetrics and Gynecology) Monna Fam, MD as Consulting Physician (Ophthalmology) Suella Broad, MD as Consulting Physician (Physical Medicine and Rehabilitation) Lavonna Monarch, MD as Consulting Physician (Dermatology)  Indicate any recent Medical Services you may have received from other than [facility] providers in the past year (date may be approximate).     Assessment:   This is a routine wellness examination for [place].  Hearing/Vision screen Hearing Screening - Comments:: No hearing difficulty Vision Screening - Comments:: Wears glasses. Followed by Dr Pandora Leiter  Dietary issues and exercise activities discussed: Exercise limited by: None identified   Goals Addressed               This Visit's Progress     Increase physical activity (pt-stated)        Would like lose weight. Increase walking and work in yard.       Depression Screen PHQ 2/9 Scores 07/04/2021  07/07/2019 02/12/2018 09/03/2016 04/12/2015 10/13/2014 09/21/2013  PHQ - 2 Score 0 0 0 0 0 0 0    Fall Risk Fall Risk  07/04/2021 07/07/2019 04/07/2019 12/02/2018 03/31/2018  Falls in the past year? 0 0 0 0 0  Comment - - Emmi Telephone Survey: data to providers prior to load - Emmi Telephone Survey: data to providers prior to load  Number falls in past yr: 0 0 - 0 -  Injury with Fall? 0 0 - 0 -  Risk for fall due to : No Fall Risks - - - -    FALL RISK PREVENTION PERTAINING TO THE HOME:  Any stairs in or around the home? Yes  If so, are there any without handrails? No  Home free of loose throw rugs in walkways, pet beds, electrical cords, etc? Yes  Adequate lighting in your home to reduce risk of falls? Yes   ASSISTIVE DEVICES UTILIZED TO PREVENT FALLS:  Life alert? No  Use of a cane, walker or w/c? No  Grab bars in the bathroom? Yes  Shower chair or bench in shower? Yes  Elevated toilet seat or a handicapped toilet? No   TIMED UP AND GO:  Was the test performed? No . Audio Visit   Cognitive Function:     6CIT Screen 07/04/2021  What Year? 0 points  What month? 0 points  What time? 0 points  Count back from 20 0 points  Repeat phrase 0 points    Immunizations Immunization History  Administered Date(s) Administered   Fluad Quad(high Dose 65+) 02/03/2019, 02/04/2020, 02/20/2021   Influenza Split 03/12/2012   Influenza, High Dose Seasonal PF 04/12/2015, 02/10/2016, 02/08/2017, 02/12/2018   Influenza,inj,Quad PF,6+ Mos 02/03/2013, 02/03/2014   MODERNA COVID-19 SARS-COV-2 PEDS BIVALENT BOOSTER 6Y-11Y 09/10/2019   Pneumococcal Conjugate-13 09/21/2013   Pneumococcal Polysaccharide-23 04/12/2015   Tdap 07/24/2012    TDAP status: Up to date  Flu Vaccine status: Up to date  Pneumococcal vaccine status: Up to date  Covid-19 vaccine status: Completed vaccines  Qualifies for Shingles Vaccine? Yes   Zostavax completed No   Shingrix Completed?: No.    Education has been  provided regarding the importance of this vaccine. Patient has been advised to call insurance company to determine out of pocket expense if they have not yet received this vaccine. Advised may also receive vaccine at local pharmacy or Health Dept. Verbalized acceptance and understanding.  Screening Tests Health Maintenance  Topic Date Due   COVID-19 Vaccine (1) 09/10/2019   Zoster Vaccines- Shingrix (1 of 2) 10/01/2021 (Originally 08/11/1964)   MAMMOGRAM  11/10/2021   TETANUS/TDAP  07/25/2022   Pneumonia Vaccine 71+ Years old  Completed   INFLUENZA VACCINE  Completed   DEXA SCAN  Completed   Hepatitis C Screening  Completed   HPV VACCINES  Aged Out   COLONOSCOPY (Pts 45-68yrs Insurance coverage will need to be confirmed)  Discontinued    Health Maintenance  Health Maintenance Due  Topic Date Due   COVID-19 Vaccine (1) 09/10/2019    Colorectal cancer screening: No longer required.   Mammogram status: No longer required due to Age.  Bone Density status: Completed 09/21/14. Results reflect: Bone density results: OSTEOPENIA. Repeat every 5 years.  Lung Cancer Screening: (Low Dose CT Chest recommended if Age 33-80 years, 30 pack-year currently smoking OR have quit w/in 15years.) does not qualify.     Additional Screening:  Hepatitis C Screening: does qualify; Completed 02/12/18  Vision Screening: Recommended annual ophthalmology exams for early detection of glaucoma and other disorders of the eye. Is the patient up to date with their annual eye exam?  Yes  Who is the provider or what is the name of the office in which the patient attends annual eye exams? Dr Pandora Leiter If pt is not established with a provider, would they like to be referred to a provider to establish care? No .   Dental Screening: Recommended annual dental exams for proper oral hygiene  Community Resource Referral / Chronic Care Management:  CRR required this visit?  No   CCM required this visit?  No       Plan:     I have personally reviewed and noted the following in the patients chart:   Medical and social history Use of alcohol, tobacco or illicit drugs  Current medications and supplements including opioid prescriptions. Patient currently taking opioids Functional ability and status Nutritional status Physical activity Advanced directives List of other physicians Hospitalizations, surgeries, and ER visits in previous 12 months Vitals Screenings to include cognitive, depression, and falls Referrals and appointments  In addition, I have reviewed and discussed with patient certain preventive protocols, quality metrics, and best practice recommendations. A written personalized care plan for preventive services as well as general preventive health recommendations were provided to patient.     Criselda Peaches, LPN   [id number]   Nurse Notes: None

## 2021-07-04 NOTE — Patient Instructions (Addendum)
Cassidy Norton , Thank you for taking time to come for your Medicare Wellness Visit. I appreciate your ongoing commitment to your health goals. Please review the following plan we discussed and let me know if I can assist you in the future.   These are the goals we discussed:  Goals       Increase physical activity (pt-stated)      Would like lose weight. Increase walking and work in yard.        This is a list of the screening recommended for you and due dates:  Health Maintenance  Topic Date Due   COVID-19 Vaccine (1) 09/10/2019   Zoster (Shingles) Vaccine (1 of 2) 10/01/2021*   Mammogram  11/10/2021   Tetanus Vaccine  07/25/2022   Pneumonia Vaccine  Completed   Flu Shot  Completed   DEXA scan (bone density measurement)  Completed   Hepatitis C Screening: USPSTF Recommendation to screen - Ages 59-79 yo.  Completed   HPV Vaccine  Aged Out   Colon Cancer Screening  Discontinued  *Topic was postponed. The date shown is not the original due date.   Opioid Pain Medicine Management Opioids are powerful medicines that are used to treat moderate to severe pain. When used for short periods of time, they can help you to: Sleep better. Do better in physical or occupational therapy. Feel better in the first few days after an injury. Recover from surgery. Opioids should be taken with the supervision of a trained health care provider. They should be taken for the shortest period of time possible. This is because opioids can be addictive, and the longer you take opioids, the greater your risk of addiction. This addiction can also be called opioid use disorder. What are the risks? Using opioid pain medicines for longer than 3 days increases your risk of side effects. Side effects include: Constipation. Nausea and vomiting. Breathing difficulties (respiratory depression). Drowsiness. Confusion. Opioid use disorder. Itching. Taking opioid pain medicine for a long period of time can affect  your ability to do daily tasks. It also puts you at risk for: Motor vehicle crashes. Depression. Suicide. Heart attack. Overdose, which can be life-threatening. What is a pain treatment plan? A pain treatment plan is an agreement between you and your health care provider. Pain is unique to each person, and treatments vary depending on your condition. To manage your pain, you and your health care provider need to work together. To help you do this: Discuss the goals of your treatment, including how much pain you might expect to have and how you will manage the pain. Review the risks and benefits of taking opioid medicines. Remember that a good treatment plan uses more than one approach and minimizes the chance of side effects. Be honest about the amount of medicines you take and about any drug or alcohol use. Get pain medicine prescriptions from only one health care provider. Pain can be managed with many types of alternative treatments. Ask your health care provider to refer you to one or more specialists who can help you manage pain through: Physical or occupational therapy. Counseling (cognitive behavioral therapy). Good nutrition. Biofeedback. Massage. Meditation. Non-opioid medicine. Following a gentle exercise program. How to use opioid pain medicine Taking medicine Take your pain medicine exactly as told by your health care provider. Take it only when you need it. If your pain gets less severe, you may take less than your prescribed dose if your health care provider approves. If you are  not having pain, do nottake pain medicine unless your health care provider tells you to take it. If your pain is severe, do nottry to treat it yourself by taking more pills than instructed on your prescription. Contact your health care provider for help. Write down the times when you take your pain medicine. It is easy to become confused while on pain medicine. Writing the time can help you avoid  overdose. Take other over-the-counter or prescription medicines only as told by your health care provider. Keeping yourself and others safe  While you are taking opioid pain medicine: Do not drive, use machinery, or power tools. Do not sign legal documents. Do not drink alcohol. Do not take sleeping pills. Do not supervise children by yourself. Do not do activities that require climbing or being in high places. Do not go to a lake, river, ocean, spa, or swimming pool. Do not share your pain medicine with anyone. Keep pain medicine in a locked cabinet or in a secure area where pets and children cannot reach it. Stopping your use of opioids If you have been taking opioid medicine for more than a few weeks, you may need to slowly decrease (taper) how much you take until you stop completely. Tapering your use of opioids can decrease your risk of symptoms of withdrawal, such as: Pain and cramping in the abdomen. Nausea. Sweating. Sleepiness. Restlessness. Uncontrollable shaking (tremors). Cravings for the medicine. Do not attempt to taper your use of opioids on your own. Talk with your health care provider about how to do this. Your health care provider may prescribe a step-down schedule based on how much medicine you are taking and how long you have been taking it. Getting rid of leftover pills Do not save any leftover pills. Get rid of leftover pills safely by: Taking the medicine to a prescription take-back program. This is usually offered by the county or law enforcement. Bringing them to a pharmacy that has a drug disposal container. Flushing them down the toilet. Check the label or package insert of your medicine to see whether this is safe to do. Throwing them out in the trash. Check the label or package insert of your medicine to see whether this is safe to do. If it is safe to throw it out, remove the medicine from the original container, put it into a sealable bag or container, and  mix it with used coffee grounds, food scraps, dirt, or cat litter before putting it in the trash. Follow these instructions at home: Activity Do exercises as told by your health care provider. Avoid activities that make your pain worse. Return to your normal activities as told by your health care provider. Ask your health care provider what activities are safe for you. General instructions You may need to take these actions to prevent or treat constipation: Drink enough fluid to keep your urine pale yellow. Take over-the-counter or prescription medicines. Eat foods that are high in fiber, such as beans, whole grains, and fresh fruits and vegetables. Limit foods that are high in fat and processed sugars, such as fried or sweet foods. Keep all follow-up visits. This is important. Where to find support If you have been taking opioids for a long time, you may benefit from receiving support for quitting from a local support group or counselor. Ask your health care provider for a referral to these resources in your area. Where to find more information Centers for Disease Control and Prevention (CDC): http://www.wolf.info/ U.S. Food and Drug  Administration (FDA): [place].ch Get help right away if: You may have taken too much of an opioid (overdosed). Common symptoms of an overdose: Your breathing is slower or more shallow than normal. You have a very slow heartbeat (pulse). You have slurred speech. You have nausea and vomiting. Your pupils become very small. You have other potential symptoms: You are very confused. You faint or feel like you will faint. You have cold, clammy skin. You have blue lips or fingernails. You have thoughts of harming yourself or harming others. These symptoms may represent a serious problem that is an emergency. Do not wait to see if the symptoms will go away. Get medical help right away. Call your local emergency services (911 in the [place] Do not drive yourself to the  hospital.  If you ever feel like you may hurt yourself or others, or have thoughts about taking your own life, get help right away. Go to your nearest emergency department or: Call your local emergency services (911 in the [place] Call the [facility] [phone] in the [place] Call a suicide crisis helpline, such as the [place] at (769) 873-3864 or 988 in the [place]. This is open 24 hours a day in the [place] Text the Crisis Text Line at 479-741-3312 (in the [place].). Summary Opioid medicines can help you manage moderate to severe pain for a short period of time. A pain treatment plan is an agreement between you and your health care provider. Discuss the goals of your treatment, including how much pain you might expect to have and how you will manage the pain. If you think that you or someone else may have taken too much of an opioid, get medical help right away. This information is not intended to replace advice given to you by your health care provider. Make sure you discuss any questions you have with your health care provider. Document Revised: 11/23/2020 Document Reviewed: 08/10/2020 Elsevier Patient Education  [place].   Advanced directives: Yes Patient will bring copies  Conditions/risks identified: None  Next appointment: Follow up in one year for your annual wellness visit    Preventive Care 65 Years and Older, Female Preventive care refers to lifestyle choices and visits with your health care provider that can promote health and wellness. What does preventive care include? A yearly physical exam. This is also called an annual well check. Dental exams once or twice a year. Routine eye exams. Ask your health care provider how often you should have your eyes checked. Personal lifestyle choices, including: Daily care of your teeth and gums. Regular physical activity. Eating a healthy diet. Avoiding tobacco and drug use. Limiting  alcohol use. Practicing safe sex. Taking low-dose aspirin every day. Taking vitamin and mineral supplements as recommended by your health care provider. What happens during an annual well check? The services and screenings done by your health care provider during your annual well check will depend on your age, overall health, lifestyle risk factors, and family history of disease. Counseling  Your health care provider may ask you questions about your: Alcohol use. Tobacco use. Drug use. Emotional well-being. Home and relationship well-being. Sexual activity. Eating habits. History of falls. Memory and ability to understand (cognition). Work and work Statistician. Reproductive health. Screening  You may have the following tests or measurements: Height, weight, and BMI. Blood pressure. Lipid and cholesterol levels. These may be checked every 5 years, or more frequently if you are over 46 years old. Skin check.  Lung cancer screening. You may have this screening every year starting at age 57 if you have a 30-pack-year history of smoking and currently smoke or have quit within the past 15 years. Fecal occult blood test (FOBT) of the stool. You may have this test every year starting at age 50. Flexible sigmoidoscopy or colonoscopy. You may have a sigmoidoscopy every 5 years or a colonoscopy every 10 years starting at age 34. Hepatitis C blood test. Hepatitis B blood test. Sexually transmitted disease (STD) testing. Diabetes screening. This is done by checking your blood sugar (glucose) after you have not eaten for a while (fasting). You may have this done every 1-3 years. Bone density scan. This is done to screen for osteoporosis. You may have this done starting at age 69. Mammogram. This may be done every 1-2 years. Talk to your health care provider about how often you should have regular mammograms. Talk with your health care provider about your test results, treatment options, and if  necessary, the need for more tests. Vaccines  Your health care provider may recommend certain vaccines, such as: Influenza vaccine. This is recommended every year. Tetanus, diphtheria, and acellular pertussis (Tdap, Td) vaccine. You may need a Td booster every 10 years. Zoster vaccine. You may need this after age 58. Pneumococcal 13-valent conjugate (PCV13) vaccine. One dose is recommended after age 43. Pneumococcal polysaccharide (PPSV23) vaccine. One dose is recommended after age 11. Talk to your health care provider about which screenings and vaccines you need and how often you need them. This information is not intended to replace advice given to you by your health care provider. Make sure you discuss any questions you have with your health care provider. Document Released: 05/27/2015 Document Revised: 01/18/2016 Document Reviewed: 03/01/2015 Elsevier Interactive Patient Education  2017 [place] Prevention in the Home Falls can cause injuries. They can happen to people of all ages. There are many things you can do to make your home safe and to help prevent falls. What can I do on the outside of my home? Regularly fix the edges of walkways and driveways and fix any cracks. Remove anything that might make you trip as you walk through a door, such as a raised step or threshold. Trim any bushes or trees on the path to your home. Use bright outdoor lighting. Clear any walking paths of anything that might make someone trip, such as rocks or tools. Regularly check to see if handrails are loose or broken. Make sure that both sides of any steps have handrails. Any raised decks and porches should have guardrails on the edges. Have any leaves, snow, or ice cleared regularly. Use sand or salt on walking paths during winter. Clean up any spills in your garage right away. This includes oil or grease spills. What can I do in the bathroom? Use night lights. Install grab bars by the toilet  and in the tub and shower. Do not use towel bars as grab bars. Use non-skid mats or decals in the tub or shower. If you need to sit down in the shower, use a plastic, non-slip stool. Keep the floor dry. Clean up any water that spills on the floor as soon as it happens. Remove soap buildup in the tub or shower regularly. Attach bath mats securely with double-sided non-slip rug tape. Do not have throw rugs and other things on the floor that can make you trip. What can I do in the bedroom? Use night lights. Make sure  that you have a light by your bed that is easy to reach. Do not use any sheets or blankets that are too big for your bed. They should not hang down onto the floor. Have a firm chair that has side arms. You can use this for support while you get dressed. Do not have throw rugs and other things on the floor that can make you trip. What can I do in the kitchen? Clean up any spills right away. Avoid walking on wet floors. Keep items that you use a lot in easy-to-reach places. If you need to reach something above you, use a strong step stool that has a grab bar. Keep electrical cords out of the way. Do not use floor polish or wax that makes floors slippery. If you must use wax, use non-skid floor wax. Do not have throw rugs and other things on the floor that can make you trip. What can I do with my stairs? Do not leave any items on the stairs. Make sure that there are handrails on both sides of the stairs and use them. Fix handrails that are broken or loose. Make sure that handrails are as long as the stairways. Check any carpeting to make sure that it is firmly attached to the stairs. Fix any carpet that is loose or worn. Avoid having throw rugs at the top or bottom of the stairs. If you do have throw rugs, attach them to the floor with carpet tape. Make sure that you have a light switch at the top of the stairs and the bottom of the stairs. If you do not have them, ask someone to add  them for you. What else can I do to help prevent falls? Wear shoes that: Do not have high heels. Have rubber bottoms. Are comfortable and fit you well. Are closed at the toe. Do not wear sandals. If you use a stepladder: Make sure that it is fully opened. Do not climb a closed stepladder. Make sure that both sides of the stepladder are locked into place. Ask someone to hold it for you, if possible. Clearly mark and make sure that you can see: Any grab bars or handrails. First and last steps. Where the edge of each step is. Use tools that help you move around (mobility aids) if they are needed. These include: Canes. Walkers. Scooters. Crutches. Turn on the lights when you go into a dark area. Replace any light bulbs as soon as they burn out. Set up your furniture so you have a clear path. Avoid moving your furniture around. If any of your floors are uneven, fix them. If there are any pets around you, be aware of where they are. Review your medicines with your doctor. Some medicines can make you feel dizzy. This can increase your chance of falling. Ask your doctor what other things that you can do to help prevent falls. This information is not intended to replace advice given to you by your health care provider. Make sure you discuss any questions you have with your health care provider. Document Released: 02/24/2009 Document Revised: 10/06/2015 Document Reviewed: 06/04/2014 Elsevier Interactive Patient Education  2017 [employer].

## 2021-07-09 ENCOUNTER — Encounter: Payer: Self-pay | Admitting: Dermatology

## 2021-07-09 NOTE — Progress Notes (Signed)
° °  Follow-Up Visit   Subjective  Cassidy Norton is a 76 y.o. female who presents for the following: Follow-up (Left temple- stays sore & scabs & across nose few scaly spots).  Several crusts on nose and hairline, check other spots Location:  Duration:  Quality:  Associated Signs/Symptoms: Modifying Factors:  Severity:  Timing: Context:   Objective  Well appearing patient in no apparent distress; mood and affect are within normal limits. Dorsum of Nose, Left Temporal Scalp (3) Half dozen gritty pink 5 mm crusts  Mid Supratip of Nose Subtle 2 mm flesh colored papule with tiny eccentric dell; compatible dermoscopy    A focused examination was performed including head, neck, arms, back. Relevant physical exam findings are noted in the Assessment and Plan.   Assessment & Plan    AK (actinic keratosis) (4) Dorsum of Nose; Left Temporal Scalp (3)  Destruction of lesion - Dorsum of Nose, Left Temporal Scalp Complexity: simple   Destruction method: cryotherapy   Informed consent: discussed and consent obtained   Timeout:  patient name, date of birth, surgical site, and procedure verified Lesion destroyed using liquid nitrogen: Yes   Cryotherapy cycles:  3 Outcome: patient tolerated procedure well with no complications   Post-procedure details: wound care instructions given    Sebaceous gland hyperplasia Mid Supratip of Nose  Of similar appearance of early BCC so if there is growth or bleeding return for biopsy      I, Lavonna Monarch, MD, have reviewed all documentation for this visit.  The documentation on 07/09/21 for the exam, diagnosis, procedures, and orders are all accurate and complete.

## 2021-07-20 ENCOUNTER — Other Ambulatory Visit: Payer: Self-pay | Admitting: Sports Medicine

## 2021-07-31 ENCOUNTER — Telehealth: Payer: Self-pay | Admitting: Podiatry

## 2021-07-31 ENCOUNTER — Other Ambulatory Visit: Payer: Self-pay | Admitting: Sports Medicine

## 2021-07-31 MED ORDER — PREGABALIN 150 MG PO CAPS: 150.0000 mg | ORAL_CAPSULE | Freq: Two times a day (BID) | ORAL | 3 refills | Status: DC

## 2021-07-31 NOTE — Progress Notes (Signed)
Refilled the Lyrica.  Patient has appointment upcoming with Dr. Milinda Pointer on 08/10/2021. ? ? ?

## 2021-07-31 NOTE — Telephone Encounter (Signed)
Let patient know that medication was called in .

## 2021-07-31 NOTE — Telephone Encounter (Signed)
Patient needs a refill on her Lyrica - she is almost out .   She has appointment on  08/10/21  Pharmacy :  [facility] in [place]

## 2021-08-10 ENCOUNTER — Ambulatory Visit (INDEPENDENT_AMBULATORY_CARE_PROVIDER_SITE_OTHER): Payer: Medicare Other | Admitting: Podiatry

## 2021-08-10 ENCOUNTER — Encounter: Payer: Self-pay | Admitting: Podiatry

## 2021-08-10 DIAGNOSIS — M5416 Radiculopathy, lumbar region: Secondary | ICD-10-CM

## 2021-08-10 DIAGNOSIS — R29898 Other symptoms and signs involving the musculoskeletal system: Secondary | ICD-10-CM | POA: Diagnosis not present

## 2021-08-10 DIAGNOSIS — G5793 Unspecified mononeuropathy of bilateral lower limbs: Secondary | ICD-10-CM | POA: Diagnosis not present

## 2021-08-10 NOTE — Progress Notes (Signed)
She presents today for follow-up of her neuropathy and medication management.  States that her neuropathy pain is being controlled well with the Lyrica.  She states that she is starting to feel more off balance and started to feel weakness in her legs.  She also states that her feet are still very numb. ? ?Objective: Vital signs are stable alert oriented x3 have reviewed her past medical history medications allergies surgery social history pulses remain palpable.  Neurologic sensorium is unchanged muscle strength is unchanged. ? ?Assessment: Neuropathy most likely..  I do think that some of her abduction and base of gait change in that right foot is most likely associated with them her right hip pain. ? ?Plan: At this point I feel that she needs to go to neurology for evaluation and management and then consider physical therapy for gait training. ?

## 2021-09-01 ENCOUNTER — Other Ambulatory Visit: Payer: Self-pay | Admitting: Obstetrics & Gynecology

## 2021-09-01 DIAGNOSIS — M7061 Trochanteric bursitis, right hip: Secondary | ICD-10-CM | POA: Diagnosis not present

## 2021-09-01 DIAGNOSIS — M7631 Iliotibial band syndrome, right leg: Secondary | ICD-10-CM | POA: Diagnosis not present

## 2021-09-01 DIAGNOSIS — M1711 Unilateral primary osteoarthritis, right knee: Secondary | ICD-10-CM | POA: Diagnosis not present

## 2021-09-04 ENCOUNTER — Telehealth: Payer: Self-pay | Admitting: *Deleted

## 2021-09-04 NOTE — Telephone Encounter (Signed)
Patient is 76 year old female, last annual exam was 04/2020. Called requesting refill on lidocaine-hydrocortisone 3-1% kit uses for hemorrhoids. She asked if refill could be provided? Also schedule annual exam this year? Please advise  ?

## 2021-09-05 ENCOUNTER — Ambulatory Visit (INDEPENDENT_AMBULATORY_CARE_PROVIDER_SITE_OTHER): Payer: Medicare Other | Admitting: Obstetrics & Gynecology

## 2021-09-05 ENCOUNTER — Encounter: Payer: Self-pay | Admitting: Obstetrics & Gynecology

## 2021-09-05 VITALS — BP 110/64 | Temp 97.4°F

## 2021-09-05 DIAGNOSIS — K644 Residual hemorrhoidal skin tags: Secondary | ICD-10-CM | POA: Diagnosis not present

## 2021-09-05 DIAGNOSIS — R309 Painful micturition, unspecified: Secondary | ICD-10-CM

## 2021-09-05 MED ORDER — SULFAMETHOXAZOLE-TRIMETHOPRIM 800-160 MG PO TABS: 1.0000 | ORAL_TABLET | Freq: Two times a day (BID) | ORAL | 0 refills | Status: AC

## 2021-09-05 MED ORDER — LIDOCAINE-HYDROCORTISONE ACE 3-1 % RE KIT: 1.0000 "application " | PACK | Freq: Two times a day (BID) | RECTAL | 3 refills | Status: AC

## 2021-09-05 MED ORDER — LIDOCAINE-HYDROCORTISONE ACE 3-1 % RE KIT: 1.0000 "application " | PACK | Freq: Two times a day (BID) | RECTAL | 0 refills | Status: DC

## 2021-09-05 NOTE — Telephone Encounter (Signed)
Dr.Lavoie replied "Agree to prescribe  lidocaine-hydrocortisone 3-1% kit for Hemorrhoids.  Can schedule Wellness visit in 04/2022 or a problem visit as needed. " ? ? ?Left detailed message on home # per DPR access.  ?

## 2021-09-05 NOTE — Progress Notes (Signed)
? ? ?  RECIE CIRRINCIONE 03-Apr-1946 [id number] ? ? ?     76 y.o.  [id number]  ? ?RP: Pain with urination/external hemorrhoid ? ?HPI: Pain with urination, pressure feeling when urinating and cloudy urine x 2-3 days.  No blood seen.  No fever.  External hemorrhoid, well on HC/Lidocaine.  Needs a prescription. ? ? ?OB History  ?Gravida Para Term Preterm AB Living  ?$Remove'3 2 2   1 2  'QHtGbdh$ ?SAB IAB Ectopic Multiple Live Births  ?1       2  ?  ?# Outcome Date GA Lbr Len/2nd Weight Sex Delivery Anes PTL Lv  ?3 SAB           ?2 Term     F Vag-Spont  N LIV  ?1 Term     M Vag-Spont  N LIV  ?   Birth Comments: has died in a car accident at 35 yrs  ? ? ?Past medical history,surgical history, problem list, medications, allergies, family history and social history were all reviewed and documented in the EPIC chart. ? ? ?Directed ROS with pertinent positives and negatives documented in the history of present illness/assessment and plan. ? ?Exam: ? ?Vitals:  ? 09/05/21 1055  ?BP: 110/64  ?Temp: (!) 97.4 ?F (36.3 ?C)  ?TempSrc: Oral  ? ?General appearance:  Normal ? ?CVAT Neg bilaterally ? ?Abdomen: Normal, soft ? ?Gynecologic exam: Vulva normal atrophy of menopause ? ?External Hemorrhoid, non-thrombosed, not bleeding ? ?U/A: Yellow cloudy, Pro Neg, Nit Neg, WBC 40-60, RBC 3-10, Bacteria Many.  U. Culture pending. ? ? ?Assessment/Plan:  76 y.o. [id number]  ? ?1. Pain with urination ?Pain with urination, pressure feeling when urinating and cloudy urine x 2-3 days.  No blood seen.  No fever.  U/A abnormal, probable acute cystitis.  Dx and management discussed.  Increase water intake. Will treat with Bactrim DS 1 tab PO BID x 3 days.  Usage reviewed, prescription sent to pharmacy.  Pending U. Culture, will change ABTx if resistance is present. ? - Urinalysis,Complete w/RFL Culture ? ?2. External hemorrhoid ? External hemorrhoid, well on HC/Lidocaine.  Prescription sent to pharmacy. ? ?Other orders ?- lidocaine-hydrocortisone (ANAMANTLE) 3-1 % KIT; Place 1  application. rectally 2 (two) times daily for 14 days. ?- sulfamethoxazole-trimethoprim (BACTRIM DS) 800-160 MG tablet; Take 1 tablet by mouth 2 (two) times daily for 3 days.  ? ?Princess Bruins MD, 11:10 AM 09/05/2021 ? ? ? ?  ?

## 2021-09-06 ENCOUNTER — Ambulatory Visit: Payer: Medicare Other | Admitting: Dermatology

## 2021-09-08 LAB — URINE CULTURE
MICRO NUMBER:: 13308708
SPECIMEN QUALITY:: ADEQUATE

## 2021-09-08 LAB — URINALYSIS, COMPLETE W/RFL CULTURE
Bilirubin Urine: NEGATIVE
Casts: NONE SEEN /LPF
Crystals: NONE SEEN /HPF
Glucose, UA: NEGATIVE
Hyaline Cast: NONE SEEN /LPF
Ketones, ur: NEGATIVE
Nitrites, Initial: NEGATIVE
Protein, ur: NEGATIVE
Specific Gravity, Urine: 1.01 (ref 1.001–1.035)
Yeast: NONE SEEN /HPF
pH: 7 (ref 5.0–8.0)

## 2021-09-08 LAB — CULTURE INDICATED

## 2021-09-19 ENCOUNTER — Other Ambulatory Visit: Payer: Self-pay | Admitting: Physician Assistant

## 2021-09-19 ENCOUNTER — Other Ambulatory Visit: Payer: Self-pay | Admitting: Family Medicine

## 2021-09-21 DIAGNOSIS — N3941 Urge incontinence: Secondary | ICD-10-CM | POA: Diagnosis not present

## 2021-09-21 DIAGNOSIS — N3281 Overactive bladder: Secondary | ICD-10-CM | POA: Diagnosis not present

## 2021-10-02 ENCOUNTER — Encounter: Payer: Self-pay | Admitting: Neurology

## 2021-10-02 ENCOUNTER — Ambulatory Visit (INDEPENDENT_AMBULATORY_CARE_PROVIDER_SITE_OTHER): Payer: Medicare Other | Admitting: Neurology

## 2021-10-02 ENCOUNTER — Telehealth: Payer: Self-pay | Admitting: Neurology

## 2021-10-02 VITALS — BP 161/70 | HR 73 | Ht 64.0 in | Wt 148.0 lb

## 2021-10-02 DIAGNOSIS — G629 Polyneuropathy, unspecified: Secondary | ICD-10-CM

## 2021-10-02 DIAGNOSIS — E538 Deficiency of other specified B group vitamins: Secondary | ICD-10-CM | POA: Diagnosis not present

## 2021-10-02 DIAGNOSIS — R269 Unspecified abnormalities of gait and mobility: Secondary | ICD-10-CM

## 2021-10-02 DIAGNOSIS — R200 Anesthesia of skin: Secondary | ICD-10-CM

## 2021-10-02 DIAGNOSIS — G4700 Insomnia, unspecified: Secondary | ICD-10-CM | POA: Diagnosis not present

## 2021-10-02 MED ORDER — IMIPRAMINE HCL 25 MG PO TABS: 25.0000 mg | ORAL_TABLET | Freq: Every day | ORAL | 5 refills | Status: DC

## 2021-10-02 NOTE — Telephone Encounter (Signed)
Called and spoke with pt. Explained it is an antidepressant but being used for off label use: neuropathic pain. This is commonly used for this reason. She verbalized understanding and appreciation. She will try medication and call if she has any trouble tolerating.

## 2021-10-02 NOTE — Progress Notes (Signed)
[facility]  PATIENT: Cassidy Norton DOB: October 22, 1945  REFERRING DOCTOR OR PCP: Tyson Dense D.P.M. (podiatry); Carolann Littler, MD (PCP) SOURCE: Patient, notes from podiatry, lab reports  _________________________________   HISTORICAL  CHIEF COMPLAINT:  Chief Complaint  Patient presents with   New Patient (Initial Visit)    Rm 1, alone. Pt referred for neuropathy in RLE and weakness. Pt reports neuropathy in bilateral feet. Balance is off. Has had injection in her R hip around first week of April.     HISTORY OF PRESENT ILLNESS:  I had the pleasure of seeing your patient, Cassidy Norton, at [facility] for neurologic consultation regarding her neuropathy and leg weakness.  She is a 76 year old woman who has had numbness and pain in her feet x 5 years.  Symptoms gradually progressed.   They feel asleep, toes worse than proximal foot.  Above her ankle, she has normal sensation  She denies weakness.  She denies any foot trauma.    Balance is worse with eyes closed.  She needs to use grab bars in the shower when eyes closed.        She reports having an electrical test for neuropathy about 3-5 years ago but we do not have results.   Lyrica was started with benefit a couple years ago and she is on 150 mg po bid now and tolerates it well.        She also has had right hip pain and had a recent trochanteric bursa injection into the hip has helped.  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   Dr. Nelva Bush has done several injecitons, last one > 3 years ago.     Her hands stay cold and tingle.    Vit B12 ws low in 2018 but has been nrmal or mildly high since starting B12 pills (1000 mcg daily).   She has hypothyroidism and is on Synthroid.    In 2018, she had an SPEP but no definite paraprotein.   HgBA1c has been normal.      REVIEW OF SYSTEMS: Constitutional: No fevers, chills, sweats, or change in appetite Eyes: No visual changes,  double vision, eye pain Ear, nose and throat: No hearing loss, ear pain, nasal congestion, sore throat Cardiovascular: No chest pain, palpitations Respiratory:  No shortness of breath at rest or with exertion.   No wheezes GastrointestinaI: No nausea, vomiting, diarrhea, abdominal pain, fecal incontinence Genitourinary:  No dysuria, urinary retention or frequency.  No nocturia. Musculoskeletal:  No neck pain, back pain Integumentary: No rash, pruritus, skin lesions Neurological: as above Psychiatric: No depression at this time.  No anxiety Endocrine: No palpitations, diaphoresis, change in appetite, change in weigh or increased thirst Hematologic/Lymphatic:  No anemia, purpura, petechiae. Allergic/Immunologic: No itchy/runny eyes, nasal congestion, recent allergic reactions, rashes  ALLERGIES: Allergies  Allergen Reactions   Aspirin     REACTION: Hives    HOME MEDICATIONS:  Current Outpatient Medications:    cycloSPORINE (RESTASIS) 0.05 % ophthalmic emulsion, Place 1 drop into the left eye 2 (two) times daily., Disp: , Rfl:    estradiol (ESTRACE) 0.5 MG tablet, TAKE ONE-HALF (1/2) TABLET EVERY OTHER DAY, Disp: 25 tablet, Rfl: 3   HYDROcodone-acetaminophen (NORCO) 10-325 MG per tablet, Take 0.5 tablets by mouth every 6 (six) hours as needed., Disp: , Rfl:    imipramine (TOFRANIL) 25 MG tablet, Take 1 tablet (25 mg total) by mouth at bedtime., Disp: 30 tablet, Rfl: 5  levothyroxine (SYNTHROID) 112 MCG tablet, TAKE 1 TABLET BY MOUTH EVERY DAY, Disp: 90 tablet, Rfl: 0   Multiple Vitamins-Minerals (PRESERVISION AREDS 2 PO), Take 2 tablets by mouth daily in the afternoon., Disp: , Rfl:    omeprazole (PRILOSEC) 40 MG capsule, Take 1 capsule (40 mg total) by mouth 2 (two) times daily., Disp: 180 capsule, Rfl: 2   polyethylene glycol (MIRALAX / GLYCOLAX) packet, Take 17 g by mouth daily as needed. , Disp: , Rfl:    pregabalin (LYRICA) 150 MG capsule, Take 1 capsule (150 mg total) by mouth 2  (two) times daily., Disp: 180 capsule, Rfl: 3   Propylene Glycol (SYSTANE COMPLETE OP), Apply to eye at bedtime. , Disp: , Rfl:    Psyllium 30.9 % POWD, Take by mouth. One tsp daily at bedtime, Disp: , Rfl:    sucralfate (CARAFATE) 1 g tablet, TAKE 1 TABLET (1 G TOTAL) BY MOUTH 3 (THREE) TIMES DAILY BETWEEN MEALS., Disp: 270 tablet, Rfl: 5   Vibegron (GEMTESA) 75 MG TABS, Take 1 tablet by mouth daily., Disp: , Rfl:    vitamin B-12 (CYANOCOBALAMIN) 1000 MCG tablet, Take 1,000 mcg by mouth daily., Disp: , Rfl:   PAST MEDICAL HISTORY: Past Medical History:  Diagnosis Date   Abdominal pain, epigastric 08/22/2009   Adenomatous colon polyp    Chronic low back pain    Diverticulosis    Duodenal ulcer    GERD (gastroesophageal reflux disease)    Hemorrhoids    Hiatal hernia    HYPOTHYROIDISM 07/20/2008   Insomnia    Keratosis 09/11/2020   Right Hand   NSVD (normal spontaneous vaginal delivery)    X2    PAST SURGICAL HISTORY: Past Surgical History:  Procedure Laterality Date   ABDOMINAL HYSTERECTOMY     TVH   BACK SURGERY     RUPTURED [place]   BLEPHAROPLASTY     CHOLECYSTECTOMY     COLONOSCOPY  JAN 2014   NEXT COLONOSCOPY IN 10 YEARS   DILATION AND CURETTAGE OF UTERUS     EYE SURGERY  2008   BILATERAL CATARACT    SHOULDER SURGERY Right 05/20/2020   [facility] Dr Onnie Graham    FAMILY HISTORY: Family History  Problem Relation Age of Onset   Cancer Mother        bladder CA   Diabetes Mother    Cancer Father        gallblader CA   Heart disease Father    Diabetes Sister    Hypertension Sister    Diabetes Brother    Hypertension Brother    Heart disease Brother    Heart disease Daughter    Stomach cancer Neg Hx    Colon cancer Neg Hx     SOCIAL HISTORY:  Social History   Socioeconomic History   Marital status: Widowed    Spouse name: Not on file   Number of children: 1   Years of education: Not on file   Highest education level: Not on file  Occupational  History   Occupation: Retired    Fish farm manager: RETIRED  Tobacco Use   Smoking status: Former    Packs/day: 1.50    Years: 40.00    Pack years: 60.00    Types: Cigarettes    Quit date: 08/28/2003    Years since quitting: 18.1   Smokeless tobacco: Never  Vaping Use   Vaping Use: Never used  Substance and Sexual Activity   Alcohol use: No    Alcohol/week: 0.0 standard  drinks   Drug use: No   Sexual activity: Not Currently    Partners: Male    Birth control/protection: Surgical    Comment: HYSTERECTOMY  Other Topics Concern   Not on file  Social History Narrative   Lives alone, husband passed away 16-Jul-2020   R handed   Caffeine: 2 C of coffee in the AM   Social Determinants of Health   Financial Resource Strain: Low Risk    Difficulty of Paying Living Expenses: Not hard at all  Food Insecurity: No Food Insecurity   Worried About Charity fundraiser in the Last Year: Never true   [place] in the Last Year: Never true  Transportation Needs: No Transportation Needs   Lack of Transportation (Medical): No   Lack of Transportation (Non-Medical): No  Physical Activity: Sufficiently Active   Days of Exercise per Week: 6 days   Minutes of Exercise per Session: 30 min  Stress: Not on file  Social Connections: Moderately Integrated   Frequency of Communication with Friends and Family: More than three times a week   Frequency of Social Gatherings with Friends and Family: More than three times a week   Attends Religious Services: More than 4 times per year   Active Member of [employer] or Organizations: Yes   Attends Archivist Meetings: Never   Marital Status: Widowed  Intimate Partner Violence: Not At Risk   Fear of Current or Ex-Partner: No   Emotionally Abused: No   Physically Abused: No   Sexually Abused: No     PHYSICAL EXAM  Vitals:   10/02/21 0957  BP: (!) 161/70  Pulse: 73  Weight: 148 lb (67.1 kg)  Height: _0  (1.626 m)    Body mass index is 25.4  kg/m.   General: The patient is well-developed and well-nourished and in no acute distress  HEENT:  Head is [place]/AT.  Sclera are anicteric.     Neck: No carotid bruits are noted.  The neck is nontender.  Cardiovascular: The heart has a regular rate and rhythm with a normal S1 and S2. There were no murmurs, gallops or rubs.    Skin: Extremities are without rash.  She has pedal edema, mild on the left and minimal on the right.  Musculoskeletal:  Back is nontender  Neurologic Exam  Mental status: The patient is alert and oriented x 3 at the time of the examination. The patient has apparent normal recent and remote memory, with an apparently normal attention span and concentration ability.   Speech is normal.  Cranial nerves: Extraocular movements are full. Pupils are equal, round, and reactive to light and accomodation.  There is good facial sensation to soft touch bilaterally.Facial strength is normal.  Trapezius and sternocleidomastoid strength is normal. No dysarthria is noted.  The tongue is midline, and the patient has symmetric elevation of the soft palate. No obvious hearing deficits are noted.  Motor:  Muscle bulk is normal.   Tone is normal. Strength is  5 / 5 in all 4 extremities.   Sensory: Sensory testing is intact to pinprick, soft touch and vibration sensation in the arms.  She had normal sensation to vibration at the knees, 75% reduced at the ankles and absent at the toes.  Pinprick sensation was fairly normal in the feet and legs  Other: Tinel signs were negative.  Phalen was mildly positive.  Coordination: Cerebellar testing reveals good finger-nose-finger and heel-to-shin bilaterally.  Gait and station: Station is  normal.  Gait is mildly wide-based and she elevates the right hip some with her walking.  Tandem is wide.  Romberg was negative at 6 inches and borderline at 3 inches.  Reflexes: Deep tendon reflexes are symmetric and normal in arms, trace at the knees and  absent at the ankles..   Plantar responses are flexor.    DIAGNOSTIC DATA (LABS, IMAGING, TESTING) - I reviewed patient records, labs, notes, testing and imaging myself where available.  Lab Results  Component Value Date   WBC 9.2 12/30/2020   HGB 13.3 12/30/2020   HCT 39.6 12/30/2020   MCV 95.0 12/30/2020   PLT 211.0 12/30/2020      Component Value Date/Time   NA 140 12/30/2020 1013   K 3.8 12/30/2020 1013   CL 103 12/30/2020 1013   CO2 25 12/30/2020 1013   GLUCOSE 104 (H) 12/30/2020 1013   BUN 9 12/30/2020 1013   CREATININE 0.65 12/30/2020 1013   CALCIUM 9.2 12/30/2020 1013   PROT 6.5 05/08/2017 1537   ALBUMIN 4.2 08/22/2016 1559   AST 16 08/22/2016 1559   ALT 15 08/22/2016 1559   ALKPHOS 80 08/22/2016 1559   BILITOT 0.4 08/22/2016 1559   Lab Results  Component Value Date   CHOL 147 07/27/2013   HDL 35.00 (L) 07/27/2013   LDLCALC 79 07/27/2013   TRIG 165.0 (H) 07/27/2013   CHOLHDL 4 07/27/2013   Lab Results  Component Value Date   HGBA1C 5.6 05/08/2017   Lab Results  Component Value Date   VITAMINB12 1,069 (H) 12/30/2020   Lab Results  Component Value Date   TSH 2.81 06/07/2021       ASSESSMENT AND PLAN  Polyneuropathy - Plan: Multiple Myeloma Panel (SPEP&IFE w/QIG), Sjogren's syndrome antibods(ssa + ssb), Rheumatoid factor, Sedimentation rate, C-reactive protein  Numbness - Plan: Multiple Myeloma Panel (SPEP&IFE w/QIG), Sjogren's syndrome antibods(ssa + ssb), Rheumatoid factor, Sedimentation rate, C-reactive protein  B12 deficiency  Insomnia, unspecified type  Gait disturbance  In summary, Cassidy Norton is a 76 year old woman with numbness in both feet and mildly reduced balance.  Additionally, she has lumbar degenerative changes and right hip trochanteric bursitis.  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    We will check an SPEP/IEF to rule out MGUS and also check ESR, CRP, SSA/SSB and  rheumatoid factor to look for inflammatory etiology.  B12 was low in the past but has been supplemented and has been in the normal range to slightly above normal range.  She has not had elevated glucose and hemoglobin A1c was normal in the past.   If symptoms progress, we would need to consider an NCV/EMG study to confirm the diagnosis.  If gait worsens, might also need to consider imaging of the spine.   She is getting a benefit from pregabalin with less neuropathic pain.  However, this still bothers her especially at night.  Therefore I will add imipramine 25 mg p.o. nightly.  Hopefully this will also help her insomnia.  If she has trouble tolerating imipramine, consider duloxetine instead.  If no benefit and neuropathic pain persists lamotrigine is another option.     She also reports some tingling in the hands.  No numbness or weakness was noted on examination.  It is possible she has a mild carpal tunnel syndrome. She will return to see [place] as needed based on the results of the studies and her response to medication.  Thank you for asking me  to see Cassidy Norton.  Please let me know if I can be of further assistance with her or other patients in the future   Jaevion Goto A. Felecia Shelling, MD, [facility] 7/74/1287, 86:76 PM Certified in Neurology, Clinical Neurophysiology, Sleep Medicine and Neuroimaging  [facility] [address], [place], [place] [address] [phone]

## 2021-10-02 NOTE — Telephone Encounter (Signed)
At 1:47 pt left vm stating concerns about being prescribed imipramine (TOFRANIL) 25 MG tablet, pt would like a call to discuss the reason as to why she was prescribed this anti depressant

## 2021-10-05 ENCOUNTER — Telehealth: Payer: Self-pay | Admitting: Neurology

## 2021-10-05 LAB — MULTIPLE MYELOMA PANEL, SERUM
Albumin SerPl Elph-Mcnc: 3.5 g/dL (ref 2.9–4.4)
Albumin/Glob SerPl: 1.3 (ref 0.7–1.7)
Alpha 1: 0.2 g/dL (ref 0.0–0.4)
Alpha2 Glob SerPl Elph-Mcnc: 0.8 g/dL (ref 0.4–1.0)
B-Globulin SerPl Elph-Mcnc: 1.1 g/dL (ref 0.7–1.3)
Gamma Glob SerPl Elph-Mcnc: 0.7 g/dL (ref 0.4–1.8)
Globulin, Total: 2.8 g/dL (ref 2.2–3.9)
IgA/Immunoglobulin A, Serum: 232 mg/dL (ref 64–422)
IgG (Immunoglobin G), Serum: 700 mg/dL (ref 586–1602)
IgM (Immunoglobulin M), Srm: 44 mg/dL (ref 26–217)
Total Protein: 6.3 g/dL (ref 6.0–8.5)

## 2021-10-05 LAB — SEDIMENTATION RATE: Sed Rate: 3 mm/hr (ref 0–40)

## 2021-10-05 LAB — RHEUMATOID FACTOR: Rheumatoid fact SerPl-aCnc: <10 IU/mL (ref <14.0)

## 2021-10-05 LAB — SJOGREN'S SYNDROME ANTIBODS(SSA + SSB)
ENA SSA (RO) Ab: <0.2 AI (ref 0.0–0.9)
ENA SSB (LA) Ab: <0.2 AI (ref 0.0–0.9)

## 2021-10-05 LAB — C-REACTIVE PROTEIN: CRP: 4 mg/L (ref 0–10)

## 2021-10-05 NOTE — Telephone Encounter (Signed)
-----   Message from Britt Bottom, MD sent at 10/05/2021  3:29 PM EDT ----- Please let the patient know that the lab work is fine.

## 2021-10-05 NOTE — Telephone Encounter (Signed)
Called the patient and reviewed the lab results with her. Informed her that it was normal and there was nothing concerning.Pt verbalized understanding.

## 2021-10-16 ENCOUNTER — Other Ambulatory Visit: Payer: Self-pay | Admitting: Physician Assistant

## 2021-11-13 DIAGNOSIS — Z1231 Encounter for screening mammogram for malignant neoplasm of breast: Secondary | ICD-10-CM | POA: Diagnosis not present

## 2021-11-13 LAB — HM MAMMOGRAPHY

## 2021-11-15 ENCOUNTER — Encounter: Payer: Self-pay | Admitting: Obstetrics & Gynecology

## 2021-11-21 ENCOUNTER — Encounter: Payer: Self-pay | Admitting: Family Medicine

## 2021-11-26 DIAGNOSIS — S61012A Laceration without foreign body of left thumb without damage to nail, initial encounter: Secondary | ICD-10-CM | POA: Diagnosis not present

## 2021-11-27 ENCOUNTER — Telehealth: Payer: Self-pay | Admitting: Family Medicine

## 2021-11-27 NOTE — Telephone Encounter (Signed)
Left a message for the pt to return my call.  

## 2021-11-27 NOTE — Telephone Encounter (Signed)
Pt informed of the message and verbalized understanding, Pt added to NV schedule

## 2021-11-27 NOTE — Telephone Encounter (Signed)
Pt injured her finger, cut it on storm door, system shows last tetanus shot 07/2012, wonders if she needs another tetanus shot after this injury. was given an antibiotic cream.

## 2021-11-28 ENCOUNTER — Ambulatory Visit: Payer: Medicare Other

## 2021-11-28 DIAGNOSIS — R1314 Dysphagia, pharyngoesophageal phase: Secondary | ICD-10-CM | POA: Diagnosis not present

## 2021-11-29 ENCOUNTER — Encounter: Payer: Self-pay | Admitting: Family Medicine

## 2021-11-29 ENCOUNTER — Ambulatory Visit (INDEPENDENT_AMBULATORY_CARE_PROVIDER_SITE_OTHER): Payer: Medicare Other | Admitting: Family Medicine

## 2021-11-29 VITALS — BP 114/60 | HR 80 | Temp 97.8°F | Ht 64.0 in | Wt 150.0 lb

## 2021-11-29 DIAGNOSIS — E038 Other specified hypothyroidism: Secondary | ICD-10-CM

## 2021-11-29 DIAGNOSIS — Z23 Encounter for immunization: Secondary | ICD-10-CM | POA: Diagnosis not present

## 2021-11-29 DIAGNOSIS — S61012D Laceration without foreign body of left thumb without damage to nail, subsequent encounter: Secondary | ICD-10-CM

## 2021-11-29 MED ORDER — LEVOTHYROXINE SODIUM 112 MCG PO TABS: 112.0000 ug | ORAL_TABLET | Freq: Every day | ORAL | 3 refills | Status: DC

## 2021-11-29 NOTE — Progress Notes (Signed)
   Established Patient Office Visit  Subjective   [name], female    DOB: 1946-02-12  Age: 76 y.o. MRN: [id number]  Chief Complaint  Patient presents with   Laceration    HPI   Seen for follow-up laceration left thumb.  This occurred on Saturday around 4 PM.  She was going to close a storm door and when releasing the door a piece apparently sprung back and took a large piece of tissue out of the medial aspect of her thumb.  She had significant bleeding for about 45 minutes which eventually was controlled with pressure.  She went to local urgent care.  They gave her Bactroban topically and she is kept Coban dressing on this since then.  No recurrent bleeding.  She has been cleaning daily with soap and water.  No signs of secondary infection.  Last tetanus was 2014  She does request refills of her levothyroxine.  Last labs were in January and stable. Review of Systems  Constitutional:  Negative for chills and fever.  Cardiovascular:  Negative for chest pain.      Objective:     BP 114/60 (BP Location: Left Arm, Patient Position: Sitting, Cuff Size: Normal)   Pulse 80   Temp 97.8 F (36.6 C) (Oral)   Ht '5\' 4"'$  (1.626 m)   Wt 150 lb (68 kg)   SpO2 95%   BMI 25.75 kg/m    Physical Exam Vitals reviewed.  Constitutional:      Appearance: Normal appearance.  Cardiovascular:     Rate and Rhythm: Normal rate and regular rhythm.  Pulmonary:     Effort: Pulmonary effort is normal.     Breath sounds: Normal breath sounds. No wheezing or rales.  Skin:    Comments: Avulsion laceration left thumb involving the medial aspect.  Approximately 1 x 1 cm dimension.  No erythema.  No drainage.  No active bleeding.  Neurological:     Mental Status: She is alert.      No results found for any visits on 11/29/21.    The ASCVD Risk score (Arnett DK, et al., 2019) failed to calculate for the following reasons:   Cannot find a previous HDL lab   Cannot find a  previous total cholesterol lab    Assessment & Plan:   #1 laceration left thumb.  This was an avulsion type laceration.  No signs of secondary infection.  Last tetanus 2014  -Continue good daily wound care with soap and water and continue mupirocin topically -Keep covered until further healed -Tetanus booster given  #2 hypothyroidism-refill levothyroxine for 1 year -Set up follow-up in about 6 months and recheck labs then with TSH  No follow-ups on file.    Carolann Littler, MD

## 2021-11-29 NOTE — Addendum Note (Signed)
Addended by: Nilda Riggs on: 11/29/2021 02:33 PM   Modules accepted: Orders

## 2021-11-29 NOTE — Patient Instructions (Signed)
Continue to clean daily with soap and water  Watch for any signs of infection- redness, swelling, pus-like drainage.

## 2021-11-30 ENCOUNTER — Ambulatory Visit: Payer: Medicare Other | Admitting: Adult Health

## 2021-11-30 ENCOUNTER — Other Ambulatory Visit: Payer: Self-pay | Admitting: Otolaryngology

## 2021-11-30 DIAGNOSIS — R1314 Dysphagia, pharyngoesophageal phase: Secondary | ICD-10-CM

## 2021-12-05 ENCOUNTER — Ambulatory Visit
Admission: RE | Admit: 2021-12-05 | Discharge: 2021-12-05 | Disposition: A | Discharge status: Home / Self Care | Payer: Medicare Other | Source: Ambulatory Visit | Attending: Otolaryngology | Admitting: Otolaryngology

## 2021-12-05 ENCOUNTER — Ambulatory Visit: Payer: Medicare Other | Admitting: Family Medicine

## 2021-12-05 DIAGNOSIS — R1314 Dysphagia, pharyngoesophageal phase: Secondary | ICD-10-CM

## 2021-12-05 DIAGNOSIS — R1310 Dysphagia, unspecified: Secondary | ICD-10-CM | POA: Diagnosis not present

## 2021-12-26 DIAGNOSIS — M7631 Iliotibial band syndrome, right leg: Secondary | ICD-10-CM | POA: Diagnosis not present

## 2021-12-26 DIAGNOSIS — M1711 Unilateral primary osteoarthritis, right knee: Secondary | ICD-10-CM | POA: Diagnosis not present

## 2021-12-26 DIAGNOSIS — M7061 Trochanteric bursitis, right hip: Secondary | ICD-10-CM | POA: Diagnosis not present

## 2022-01-02 DIAGNOSIS — M25651 Stiffness of right hip, not elsewhere classified: Secondary | ICD-10-CM | POA: Diagnosis not present

## 2022-01-02 DIAGNOSIS — M7631 Iliotibial band syndrome, right leg: Secondary | ICD-10-CM | POA: Diagnosis not present

## 2022-01-02 DIAGNOSIS — M25561 Pain in right knee: Secondary | ICD-10-CM | POA: Diagnosis not present

## 2022-01-02 DIAGNOSIS — M1711 Unilateral primary osteoarthritis, right knee: Secondary | ICD-10-CM | POA: Diagnosis not present

## 2022-01-02 DIAGNOSIS — M25551 Pain in right hip: Secondary | ICD-10-CM | POA: Diagnosis not present

## 2022-01-06 DIAGNOSIS — M25561 Pain in right knee: Secondary | ICD-10-CM | POA: Diagnosis not present

## 2022-01-10 DIAGNOSIS — M25551 Pain in right hip: Secondary | ICD-10-CM | POA: Diagnosis not present

## 2022-01-10 DIAGNOSIS — M25651 Stiffness of right hip, not elsewhere classified: Secondary | ICD-10-CM | POA: Diagnosis not present

## 2022-01-12 DIAGNOSIS — M25551 Pain in right hip: Secondary | ICD-10-CM | POA: Diagnosis not present

## 2022-01-12 DIAGNOSIS — M25651 Stiffness of right hip, not elsewhere classified: Secondary | ICD-10-CM | POA: Diagnosis not present

## 2022-01-16 DIAGNOSIS — M25651 Stiffness of right hip, not elsewhere classified: Secondary | ICD-10-CM | POA: Diagnosis not present

## 2022-01-16 DIAGNOSIS — M25551 Pain in right hip: Secondary | ICD-10-CM | POA: Diagnosis not present

## 2022-01-17 DIAGNOSIS — M25551 Pain in right hip: Secondary | ICD-10-CM | POA: Diagnosis not present

## 2022-01-17 DIAGNOSIS — M25651 Stiffness of right hip, not elsewhere classified: Secondary | ICD-10-CM | POA: Diagnosis not present

## 2022-01-22 DIAGNOSIS — M7051 Other bursitis of knee, right knee: Secondary | ICD-10-CM | POA: Diagnosis not present

## 2022-01-22 DIAGNOSIS — M25561 Pain in right knee: Secondary | ICD-10-CM | POA: Diagnosis not present

## 2022-01-22 DIAGNOSIS — S83281D Other tear of lateral meniscus, current injury, right knee, subsequent encounter: Secondary | ICD-10-CM | POA: Diagnosis not present

## 2022-01-24 DIAGNOSIS — M25561 Pain in right knee: Secondary | ICD-10-CM | POA: Diagnosis not present

## 2022-01-29 DIAGNOSIS — M25561 Pain in right knee: Secondary | ICD-10-CM | POA: Diagnosis not present

## 2022-01-31 DIAGNOSIS — M25561 Pain in right knee: Secondary | ICD-10-CM | POA: Diagnosis not present

## 2022-02-05 DIAGNOSIS — M25561 Pain in right knee: Secondary | ICD-10-CM | POA: Diagnosis not present

## 2022-02-07 DIAGNOSIS — M25561 Pain in right knee: Secondary | ICD-10-CM | POA: Diagnosis not present

## 2022-02-09 DIAGNOSIS — H35031 Hypertensive retinopathy, right eye: Secondary | ICD-10-CM | POA: Diagnosis not present

## 2022-02-09 DIAGNOSIS — H353131 Nonexudative age-related macular degeneration, bilateral, early dry stage: Secondary | ICD-10-CM | POA: Diagnosis not present

## 2022-02-09 DIAGNOSIS — H18593 Other hereditary corneal dystrophies, bilateral: Secondary | ICD-10-CM | POA: Diagnosis not present

## 2022-02-09 DIAGNOSIS — H04123 Dry eye syndrome of bilateral lacrimal glands: Secondary | ICD-10-CM | POA: Diagnosis not present

## 2022-02-12 DIAGNOSIS — M25561 Pain in right knee: Secondary | ICD-10-CM | POA: Diagnosis not present

## 2022-02-13 DIAGNOSIS — S83281A Other tear of lateral meniscus, current injury, right knee, initial encounter: Secondary | ICD-10-CM | POA: Diagnosis not present

## 2022-02-15 ENCOUNTER — Other Ambulatory Visit: Payer: Self-pay | Admitting: Podiatry

## 2022-02-15 ENCOUNTER — Telehealth: Payer: Self-pay | Admitting: Podiatry

## 2022-02-15 DIAGNOSIS — M25561 Pain in right knee: Secondary | ICD-10-CM | POA: Diagnosis not present

## 2022-02-15 MED ORDER — PREGABALIN 150 MG PO CAPS: 150.0000 mg | ORAL_CAPSULE | Freq: Two times a day (BID) | ORAL | 3 refills | Status: DC

## 2022-02-15 NOTE — Telephone Encounter (Signed)
Medication has been sent to pharmacy on file,patient notified thru voice message.

## 2022-02-15 NOTE — Telephone Encounter (Signed)
Pt called to request a refill on her Rx for Lyrica; she stated she is out of medicine. Please advise.

## 2022-02-19 DIAGNOSIS — M25561 Pain in right knee: Secondary | ICD-10-CM | POA: Diagnosis not present

## 2022-02-26 DIAGNOSIS — M7061 Trochanteric bursitis, right hip: Secondary | ICD-10-CM | POA: Diagnosis not present

## 2022-02-26 DIAGNOSIS — M25561 Pain in right knee: Secondary | ICD-10-CM | POA: Diagnosis not present

## 2022-02-26 DIAGNOSIS — M7051 Other bursitis of knee, right knee: Secondary | ICD-10-CM | POA: Diagnosis not present

## 2022-03-05 DIAGNOSIS — M25561 Pain in right knee: Secondary | ICD-10-CM | POA: Diagnosis not present

## 2022-03-06 ENCOUNTER — Ambulatory Visit: Payer: Medicare Other

## 2022-03-06 ENCOUNTER — Ambulatory Visit (INDEPENDENT_AMBULATORY_CARE_PROVIDER_SITE_OTHER): Payer: Medicare Other | Admitting: Family Medicine

## 2022-03-06 VITALS — BP 122/72 | HR 65 | Temp 97.7°F | Ht 64.0 in | Wt 151.7 lb

## 2022-03-06 DIAGNOSIS — E785 Hyperlipidemia, unspecified: Secondary | ICD-10-CM | POA: Diagnosis not present

## 2022-03-06 DIAGNOSIS — Z23 Encounter for immunization: Secondary | ICD-10-CM | POA: Diagnosis not present

## 2022-03-06 DIAGNOSIS — E038 Other specified hypothyroidism: Secondary | ICD-10-CM

## 2022-03-06 DIAGNOSIS — R5383 Other fatigue: Secondary | ICD-10-CM | POA: Diagnosis not present

## 2022-03-06 DIAGNOSIS — I499 Cardiac arrhythmia, unspecified: Secondary | ICD-10-CM | POA: Diagnosis not present

## 2022-03-06 DIAGNOSIS — M1711 Unilateral primary osteoarthritis, right knee: Secondary | ICD-10-CM | POA: Diagnosis not present

## 2022-03-06 DIAGNOSIS — G6289 Other specified polyneuropathies: Secondary | ICD-10-CM

## 2022-03-06 DIAGNOSIS — G2581 Restless legs syndrome: Secondary | ICD-10-CM

## 2022-03-06 LAB — CBC WITH DIFFERENTIAL/PLATELET
Basophils Absolute: 0 10*3/uL (ref 0.0–0.1)
Basophils Relative: 0.3 % (ref 0.0–3.0)
Eosinophils Absolute: 0.1 10*3/uL (ref 0.0–0.7)
Eosinophils Relative: 1.4 % (ref 0.0–5.0)
HCT: 43.1 % (ref 36.0–46.0)
Hemoglobin: 14.6 g/dL (ref 12.0–15.0)
Lymphocytes Relative: 43.7 % (ref 12.0–46.0)
Lymphs Abs: 3.8 10*3/uL (ref 0.7–4.0)
MCHC: 33.8 g/dL (ref 30.0–36.0)
MCV: 93.3 fl (ref 78.0–100.0)
Monocytes Absolute: 0.6 10*3/uL (ref 0.1–1.0)
Monocytes Relative: 7.2 % (ref 3.0–12.0)
Neutro Abs: 4.1 10*3/uL (ref 1.4–7.7)
Neutrophils Relative %: 47.4 % (ref 43.0–77.0)
Platelets: 227 10*3/uL (ref 150.0–400.0)
RBC: 4.62 Mil/uL (ref 3.87–5.11)
RDW: 13.6 % (ref 11.5–15.5)
WBC: 8.6 10*3/uL (ref 4.0–10.5)

## 2022-03-06 LAB — COMPREHENSIVE METABOLIC PANEL
ALT: 13 U/L (ref 0–35)
AST: 18 U/L (ref 0–37)
Albumin: 4.5 g/dL (ref 3.5–5.2)
Alkaline Phosphatase: 81 U/L (ref 39–117)
BUN: 7 mg/dL (ref 6–23)
CO2: 29 mEq/L (ref 19–32)
Calcium: 9.8 mg/dL (ref 8.4–10.5)
Chloride: 103 mEq/L (ref 96–112)
Creatinine, Ser: 0.59 mg/dL (ref 0.40–1.20)
GFR: 86.23 mL/min (ref >60.00)
Glucose, Bld: 83 mg/dL (ref 70–99)
Potassium: 3.9 mEq/L (ref 3.5–5.1)
Sodium: 142 mEq/L (ref 135–145)
Total Bilirubin: 0.5 mg/dL (ref 0.2–1.2)
Total Protein: 7.5 g/dL (ref 6.0–8.3)

## 2022-03-06 LAB — TSH: TSH: 0.73 u[IU]/mL (ref 0.35–5.50)

## 2022-03-06 LAB — LDL CHOLESTEROL, DIRECT: Direct LDL: 88 mg/dL

## 2022-03-06 LAB — LIPID PANEL
Cholesterol: 186 mg/dL (ref 0–200)
HDL: 34.5 mg/dL — ABNORMAL LOW (ref >39.00)
Total CHOL/HDL Ratio: 5
Triglycerides: 436 mg/dL — ABNORMAL HIGH (ref 0.0–149.0)

## 2022-03-06 NOTE — Progress Notes (Signed)
Established Patient Office Visit  Subjective   [name], female    DOB: 10-Oct-1945  Age: 76 y.o. MRN: [id number]  Chief Complaint  Patient presents with   Restless legs   Sleeping Problem    HPI   Cassidy Norton has history of hypothyroidism, peripheral neuropathy, GERD, history of B12 deficiency.  Seen today for following multiple items  Peripheral neuropathy.  Feels off balance frequently.  Increased risk of falls.  Requesting prescription for cane.  Denies any actual falls within recent weeks  She complains of restless leg symptoms.  Worse about 7-8 at night.  She sits and tries to read and has difficulty because of sensation of constant movement of her legs which is relieved with walking.  Minimal caffeine use.  No history of recent known anemia.  Symptoms are moderate in severity.  She does not feel bad enough in severity to warrant medication yet  Hypothyroidism.  She had some recent fatigue issues.  She would like to consider getting her thyroid rechecked.  Also requesting lipids.  She has history of mild dyslipidemia with high triglycerides and low HDL.  Past Medical History:  Diagnosis Date   Abdominal pain, epigastric 08/22/2009   Adenomatous colon polyp    Chronic low back pain    Diverticulosis    Duodenal ulcer    GERD (gastroesophageal reflux disease)    Hemorrhoids    Hiatal hernia    HYPOTHYROIDISM 07/20/2008   Insomnia    Keratosis 09/11/2020   Right Hand   NSVD (normal spontaneous vaginal delivery)    X2   Past Surgical History:  Procedure Laterality Date   ABDOMINAL HYSTERECTOMY     TVH   BACK SURGERY     RUPTURED [facility]   BLEPHAROPLASTY     CHOLECYSTECTOMY     COLONOSCOPY  JAN 2014   NEXT COLONOSCOPY IN 10 YEARS   DILATION AND CURETTAGE OF UTERUS     EYE SURGERY  2008   BILATERAL CATARACT    SHOULDER SURGERY Right 05/20/2020   [facility] Dr Onnie Graham    reports that she quit smoking about 18 years ago. Her smoking use included  cigarettes. She has a 60.00 pack-year smoking history. She has never used smokeless tobacco. She reports that she does not drink alcohol and does not use drugs. family history includes Cancer in her father and mother; Diabetes in her brother, mother, and sister; Heart disease in her brother, daughter, and father; Hypertension in her brother and sister. Allergies  Allergen Reactions   Aspirin     REACTION: Hives    Review of Systems  Constitutional:  Positive for malaise/fatigue. Negative for chills and fever.  Eyes:  Negative for blurred vision.  Respiratory:  Negative for shortness of breath.   Cardiovascular:  Negative for chest pain.  Gastrointestinal:  Negative for abdominal pain.  Genitourinary:  Negative for dysuria.  Neurological:  Negative for dizziness, weakness and headaches.  Psychiatric/Behavioral:  The patient has insomnia.       Objective:     BP 122/72 (BP Location: Left Arm, Patient Position: Sitting, Cuff Size: Normal)   Pulse 65   Temp 97.7 F (36.5 C) (Oral)   Ht '5\' 4"'$  (1.626 m)   Wt 151 lb 11.2 oz (68.8 kg)   SpO2 95%   BMI 26.04 kg/m    Physical Exam Vitals reviewed.  Constitutional:      Appearance: She is well-developed.  Eyes:     Pupils: Pupils are equal,  round, and reactive to light.  Neck:     Thyroid: No thyromegaly.     Vascular: No JVD.  Cardiovascular:     Rate and Rhythm: Normal rate.     Heart sounds:     No gallop.     Comments: Irregular rhythm with question of frequent premature beats. Pulmonary:     Effort: Pulmonary effort is normal. No respiratory distress.     Breath sounds: Normal breath sounds. No wheezing or rales.  Musculoskeletal:     Cervical back: Neck supple.     Right lower leg: No edema.     Left lower leg: No edema.  Neurological:     General: No focal deficit present.     Mental Status: She is alert.     Cranial Nerves: No cranial nerve deficit.      No results found for any visits on  03/06/22.    The ASCVD Risk score (Arnett DK, et al., 2019) failed to calculate for the following reasons:   Cannot find a previous HDL lab   Cannot find a previous total cholesterol lab    Assessment & Plan:   #1 restless leg symptoms.  Worse at night.  Check ferritin and iron levels to rule out iron deficiency.  Minimal caffeine use.  We discussed medication such as Requip or Mirapex but she does not feel like symptoms are severe enough to warrant medication yet but she will consider  #2 irregular heart rhythm noted on exam.  We recommended EKG to rule out atrial fibrillation.  EKG shows sinus rhythm with frequent PACs.  No acute changes otherwise.  Reassurance and continue to watch caffeine intake  #3 fatigue.  She has history of hypothyroidism.  Recheck TSH along with CBC and chemistries  #4 hyperlipidemia.  Check fasting lipid panel  #5 balance issues and high risk for falls.  This is certainly augmented by her peripheral neuropathy but also osteoarthritis right knee.  We produced DME order for cane for home use to reduce risk of falls.   No follow-ups on file.    Carolann Littler, MD

## 2022-03-07 DIAGNOSIS — M25561 Pain in right knee: Secondary | ICD-10-CM | POA: Diagnosis not present

## 2022-03-07 LAB — IRON,TIBC AND FERRITIN PANEL
%SAT: 18 % (calc) (ref 16–45)
Ferritin: 175 ng/mL (ref 16–288)
Iron: 52 ug/dL (ref 45–160)
TIBC: 291 mcg/dL (calc) (ref 250–450)

## 2022-03-14 ENCOUNTER — Encounter: Payer: Self-pay | Admitting: Internal Medicine

## 2022-03-14 ENCOUNTER — Ambulatory Visit (INDEPENDENT_AMBULATORY_CARE_PROVIDER_SITE_OTHER): Payer: Medicare Other | Admitting: Internal Medicine

## 2022-03-14 VITALS — BP 130/70 | HR 79 | Temp 97.9°F | Wt 146.4 lb

## 2022-03-14 DIAGNOSIS — R051 Acute cough: Secondary | ICD-10-CM | POA: Diagnosis not present

## 2022-03-14 DIAGNOSIS — R197 Diarrhea, unspecified: Secondary | ICD-10-CM

## 2022-03-14 DIAGNOSIS — R0981 Nasal congestion: Secondary | ICD-10-CM | POA: Diagnosis not present

## 2022-03-14 DIAGNOSIS — R6889 Other general symptoms and signs: Secondary | ICD-10-CM | POA: Diagnosis not present

## 2022-03-14 LAB — POCT INFLUENZA A/B
Influenza A, POC: NEGATIVE
Influenza B, POC: NEGATIVE

## 2022-03-14 LAB — POC COVID19 BINAXNOW: SARS Coronavirus 2 Ag: NEGATIVE

## 2022-03-14 MED ORDER — PROMETHAZINE-DM 6.25-15 MG/5ML PO SYRP: 5.0000 mL | ORAL_SOLUTION | Freq: Four times a day (QID) | ORAL | 0 refills | Status: DC | PRN

## 2022-03-14 MED ORDER — BENZONATATE 100 MG PO CAPS: 100.0000 mg | ORAL_CAPSULE | Freq: Three times a day (TID) | ORAL | 0 refills | Status: DC | PRN

## 2022-03-14 NOTE — Progress Notes (Signed)
Chief Complaint  Patient presents with   Nasal Congestion    Reports sx started last Friday. Associate sx headache, diarrhea, vomitting, cough. Fever on Saturday and Sunday. Fever broke on Monday. Loss of appetite. Reports she had flu vaccine on last Tuesday. And feels like her sx is starting all over again with congestion, scratchy throat, cough- gray phlegm. Cough keep her up at night.     HPI: Cassidy Norton 76 y.o. come in for  above onset  after flu vaccine  fever chills  ha  vomiting and diarrhea now no vomiting stools loose better  but no fever x 2 days but exhausted  and now head congestion and cough in at night  no cp sob hemoptysis   asks if anthing can help her sleep thgouth the cough   dec appetite  taking water fluids ok   Ha and eyes burning.  Some   No sob in lungs  Exposure  to children   young family    had hand foot mouth  and day care exposures .   Daughter to have open heart surgery valve replacement next week and is avoiding exposing her.   ROS: See pertinent positives and negatives per HPI.  Past Medical History:  Diagnosis Date   Abdominal pain, epigastric 08/22/2009   Adenomatous colon polyp    Chronic low back pain    Diverticulosis    Duodenal ulcer    GERD (gastroesophageal reflux disease)    Hemorrhoids    Hiatal hernia    HYPOTHYROIDISM 07/20/2008   Insomnia    Keratosis 09/11/2020   Right Hand   NSVD (normal spontaneous vaginal delivery)    X2    Family History  Problem Relation Age of Onset   Cancer Mother        bladder CA   Diabetes Mother    Cancer Father        gallblader CA   Heart disease Father    Diabetes Sister    Hypertension Sister    Diabetes Brother    Hypertension Brother    Heart disease Brother    Heart disease Daughter    Stomach cancer Neg Hx    Colon cancer Neg Hx     Social History   Socioeconomic History   Marital status: Widowed    Spouse name: Not on file   Number of children: 1   Years of education:  Not on file   Highest education level: Not on file  Occupational History   Occupation: Retired    Fish farm manager: RETIRED  Tobacco Use   Smoking status: Former    Packs/day: 1.50    Years: 40.00    Total pack years: 60.00    Types: Cigarettes    Quit date: 08/28/2003    Years since quitting: 18.5   Smokeless tobacco: Never  Vaping Use   Vaping Use: Never used  Substance and Sexual Activity   Alcohol use: No    Alcohol/week: 0.0 standard drinks of alcohol   Drug use: No   Sexual activity: Not Currently    Partners: Male    Birth control/protection: Surgical    Comment: HYSTERECTOMY  Other Topics Concern   Not on file  Social History Narrative   Lives alone, husband passed away 08-06-2020   R handed   Caffeine: 2 C of coffee in the AM   Social Determinants of Health   Financial Resource Strain: Low Risk  (07/04/2021)   Overall Financial Resource Strain (CARDIA)  Difficulty of Paying Living Expenses: Not hard at all  Food Insecurity: No Food Insecurity (07/04/2021)   Hunger Vital Sign    Worried About Running Out of Food in the Last Year: Never true    Ran Out of Food in the Last Year: Never true  Transportation Needs: No Transportation Needs (07/04/2021)   PRAPARE - Hydrologist (Medical): No    Lack of Transportation (Non-Medical): No  Physical Activity: Sufficiently Active (07/04/2021)   Exercise Vital Sign    Days of Exercise per Week: 6 days    Minutes of Exercise per Session: 30 min  Stress: Not on file  Social Connections: Moderately Integrated (07/04/2021)   Social Connection and Isolation Panel [NHANES]    Frequency of Communication with Friends and Family: More than three times a week    Frequency of Social Gatherings with Friends and Family: More than three times a week    Attends Religious Services: More than 4 times per year    Active Member of [employer] or Organizations: Yes    Attends Archivist Meetings: Never    Marital Status:  Widowed    Outpatient Medications Prior to Visit  Medication Sig Dispense Refill   cycloSPORINE (RESTASIS) 0.05 % ophthalmic emulsion Place 1 drop into the left eye 2 (two) times daily.     estradiol (ESTRACE) 0.5 MG tablet TAKE ONE-HALF (1/2) TABLET EVERY OTHER DAY 25 tablet 3   HYDROcodone-acetaminophen (NORCO) 10-325 MG per tablet Take 0.5 tablets by mouth every 6 (six) hours as needed.     imipramine (TOFRANIL) 25 MG tablet Take 1 tablet (25 mg total) by mouth at bedtime. 30 tablet 5   levothyroxine (SYNTHROID) 112 MCG tablet Take 1 tablet (112 mcg total) by mouth daily. 90 tablet 3   Multiple Vitamins-Minerals (PRESERVISION AREDS 2 PO) Take 2 tablets by mouth daily in the afternoon.     mupirocin ointment (BACTROBAN) 2 % 1 Application 2 (two) times daily.     omeprazole (PRILOSEC) 40 MG capsule TAKE 1 CAPSULE TWICE A DAY 180 capsule 3   polyethylene glycol (MIRALAX / GLYCOLAX) packet Take 17 g by mouth daily as needed.      pregabalin (LYRICA) 150 MG capsule Take 1 capsule (150 mg total) by mouth 2 (two) times daily. 180 capsule 3   Propylene Glycol (SYSTANE COMPLETE OP) Apply to eye at bedtime.      Psyllium 30.9 % POWD Take by mouth. One tsp daily at bedtime     sucralfate (CARAFATE) 1 g tablet TAKE 1 TABLET (1 G TOTAL) BY MOUTH 3 (THREE) TIMES DAILY BETWEEN MEALS. 270 tablet 5   Vibegron (GEMTESA) 75 MG TABS Take 1 tablet by mouth daily.     vitamin B-12 (CYANOCOBALAMIN) 1000 MCG tablet Take 1,000 mcg by mouth daily.     No facility-administered medications prior to visit.     EXAM:  BP 130/70 (BP Location: Right Arm, Patient Position: Sitting, Cuff Size: Normal)   Pulse 79   Temp 97.9 F (36.6 C) (Oral)   Wt 146 lb 6.4 oz (66.4 kg)   SpO2 95%   BMI 25.13 kg/m   Body mass index is 25.13 kg/m.  GENERAL: vitals reviewed and listed above, alert, oriented, appears well hydrated and in no acute distress tired but  non toxic  HEENT: atraumatic, conjunctiva  clear, no obvious  abnormalities on inspection of external nose and ears midl congestion  tm clear OP : no lesion edema or  exudate  NECK: no obvious masses on inspection palpation  LUNGS: clear to auscultation bilaterally, no wheezes, rales or rhonchi, good air movement CV: HRRR, no clubbing cyanosis or  peripheral edema nl cap refill  Abdomen:  Sof,t normal bowel sounds without hepatosplenomegaly, no guarding rebound or masses no CVA tenderness MS: moves all extremities without noticeable focal  abnormality PSYCH: pleasant and cooperative, no obvious depression or anxiety Lab Results  Component Value Date   WBC 8.6 03/06/2022   HGB 14.6 03/06/2022   HCT 43.1 03/06/2022   PLT 227.0 03/06/2022   GLUCOSE 83 03/06/2022   CHOL 186 03/06/2022   TRIG (H) 03/06/2022    436.0 Triglyceride is over 400; calculations on Lipids are invalid.   HDL 34.50 (L) 03/06/2022   LDLDIRECT 88.0 03/06/2022   LDLCALC 79 07/27/2013   ALT 13 03/06/2022   AST 18 03/06/2022   NA 142 03/06/2022   K 3.9 03/06/2022   CL 103 03/06/2022   CREATININE 0.59 03/06/2022   BUN 7 03/06/2022   CO2 29 03/06/2022   TSH 0.73 03/06/2022   HGBA1C 5.6 05/08/2017   BP Readings from Last 3 Encounters:  03/14/22 130/70  03/06/22 122/72  11/29/21 114/60  Covid flu negative   ASSESSMENT AND PLAN:  Discussed the following assessment and plan:  Flu-like symptoms  Congestion of nasal sinus - Plan: POCT Influenza A/B  Acute cough - Plan: POC COVID-19 BinaxNow, POCT Influenza A/B  Diarrhea, unspecified type - improving - Plan: POC COVID-19 BinaxNow, POCT Influenza A/B Fluids comfort care   with caution  cough meds stay hydrated rest   Reveiwed return for alarm sx relapsing etc  -Patient advised to return or notify health care team  if  new concerns arise.  Patient Instructions  Exam is good  today  I agree this acts like a  viral ; flu like illness.   Chest exam is clear . Continue rest  and fluids hydration  .  Cough may last another  week but should not have any  fevers.  If fevers relapse or  lung breathing issues  contact medical team for reevaluation.   Cough meds to try.   Pills  day if needed Syrup at night if needed .    Standley Brooking. Nhia Heaphy M.D.

## 2022-03-14 NOTE — Patient Instructions (Addendum)
Exam is good  today  I agree this acts like a  viral ; flu like illness.   Chest exam is clear . Continue rest  and fluids hydration  .  Cough may last another week but should not have any  fevers.  If fevers relapse or  lung breathing issues  contact medical team for reevaluation.   Cough meds to try.   Pills  day if needed Syrup at night if needed .

## 2022-03-18 DIAGNOSIS — J0140 Acute pansinusitis, unspecified: Secondary | ICD-10-CM | POA: Diagnosis not present

## 2022-03-23 DIAGNOSIS — M79671 Pain in right foot: Secondary | ICD-10-CM | POA: Diagnosis not present

## 2022-03-30 ENCOUNTER — Telehealth: Payer: Self-pay | Admitting: Family Medicine

## 2022-03-30 ENCOUNTER — Other Ambulatory Visit: Payer: Self-pay

## 2022-03-30 DIAGNOSIS — G2581 Restless legs syndrome: Secondary | ICD-10-CM

## 2022-03-30 MED ORDER — PRAMIPEXOLE DIHYDROCHLORIDE 0.125 MG PO TABS: ORAL_TABLET | ORAL | 3 refills | Status: DC

## 2022-03-30 NOTE — Telephone Encounter (Signed)
Discussed at  03/06/22 office visit.   Please advise

## 2022-03-30 NOTE — Telephone Encounter (Signed)
Pt states it medication for restless leg syndrome was discussed at a previous appointment. Patient would like to start the medication.  [id number]- [place], [place] - [address] Phone: [phone] Fax: [phone]

## 2022-03-30 NOTE — Telephone Encounter (Signed)
Medication sent to [facility] in [place].   Will contact patient for F/U.

## 2022-04-02 NOTE — Telephone Encounter (Signed)
Lvm for patient to contact office to schedule 4 week follow up appointment.

## 2022-04-04 ENCOUNTER — Emergency Department (HOSPITAL_COMMUNITY): Payer: Medicare Other

## 2022-04-04 ENCOUNTER — Emergency Department (HOSPITAL_BASED_OUTPATIENT_CLINIC_OR_DEPARTMENT_OTHER): Payer: Medicare Other

## 2022-04-04 ENCOUNTER — Emergency Department (HOSPITAL_BASED_OUTPATIENT_CLINIC_OR_DEPARTMENT_OTHER)
Admission: EM | Admit: 2022-04-04 | Discharge: 2022-04-04 | Disposition: A | Discharge status: Home / Self Care | Payer: Medicare Other | Attending: Emergency Medicine | Admitting: Emergency Medicine

## 2022-04-04 ENCOUNTER — Other Ambulatory Visit: Payer: Self-pay

## 2022-04-04 ENCOUNTER — Encounter (HOSPITAL_BASED_OUTPATIENT_CLINIC_OR_DEPARTMENT_OTHER): Payer: Self-pay | Admitting: Emergency Medicine

## 2022-04-04 DIAGNOSIS — S7002XA Contusion of left hip, initial encounter: Secondary | ICD-10-CM

## 2022-04-04 DIAGNOSIS — Y92018 Other place in single-family (private) house as the place of occurrence of the external cause: Secondary | ICD-10-CM | POA: Insufficient documentation

## 2022-04-04 DIAGNOSIS — E039 Hypothyroidism, unspecified: Secondary | ICD-10-CM | POA: Insufficient documentation

## 2022-04-04 DIAGNOSIS — S0003XA Contusion of scalp, initial encounter: Secondary | ICD-10-CM | POA: Diagnosis not present

## 2022-04-04 DIAGNOSIS — M25552 Pain in left hip: Secondary | ICD-10-CM | POA: Diagnosis not present

## 2022-04-04 DIAGNOSIS — S0101XA Laceration without foreign body of scalp, initial encounter: Secondary | ICD-10-CM | POA: Diagnosis not present

## 2022-04-04 DIAGNOSIS — S0990XA Unspecified injury of head, initial encounter: Secondary | ICD-10-CM | POA: Diagnosis not present

## 2022-04-04 DIAGNOSIS — Z79899 Other long term (current) drug therapy: Secondary | ICD-10-CM | POA: Diagnosis not present

## 2022-04-04 DIAGNOSIS — M542 Cervicalgia: Secondary | ICD-10-CM | POA: Diagnosis not present

## 2022-04-04 DIAGNOSIS — W19XXXA Unspecified fall, initial encounter: Secondary | ICD-10-CM

## 2022-04-04 DIAGNOSIS — W11XXXA Fall on and from ladder, initial encounter: Secondary | ICD-10-CM | POA: Insufficient documentation

## 2022-04-04 NOTE — ED Notes (Signed)
ED Provider at bedside. 

## 2022-04-04 NOTE — ED Triage Notes (Signed)
Mechanical fall down a ladder, landing on her back. Ladder was ~ 3- 4 ft. Pt did hit head. Pt c/o left hip pain and posterior head pain. Denies blood thinners, loc, neck/back pain. Pt ambulatory with steady gait.

## 2022-04-04 NOTE — ED Notes (Signed)
Pt has a cut on top of head. Bleeding is controlled at this time

## 2022-04-04 NOTE — ED Provider Notes (Signed)
[facility] Provider Note   CSN: [id number] Arrival date & time: 04/04/22  1600     History  Chief Complaint  Patient presents with   Cassidy Norton is a 76 y.o. female.  Patient was getting Christmas decorations down from a loft in a storage facility coming down the ladder with nothing in her hand that she must of missed the wrong and fell about 4 feet.  Hit her head on what she thinks is a shovel that was in the corner and landed on her left hip.  Patient with complaint of head pain had some bleeding from the right a subtotal area.  Patient is not on blood thinners.  Also with a complaint of some neck discomfort but no back pain.  Patient states her tetanus is up-to-date.  Past medical history significant for hypothyroidism chronic low back pain.  Past surgery gallbladder removal abdominal hysterectomy.       Home Medications Prior to Admission medications   Medication Sig Start Date End Date Taking? Authorizing Provider  benzonatate (TESSALON PERLES) 100 MG capsule Take 1-2 capsules (100-200 mg total) by mouth 3 (three) times daily as needed for cough. 03/14/22   Panosh, Standley Brooking, MD  cycloSPORINE (RESTASIS) 0.05 % ophthalmic emulsion Place 1 drop into the left eye 2 (two) times daily.    [provider]  estradiol (ESTRACE) 0.5 MG tablet TAKE ONE-HALF (1/2) TABLET EVERY OTHER DAY 05/12/20   Princess Bruins, MD  HYDROcodone-acetaminophen (NORCO) 10-325 MG per tablet Take 0.5 tablets by mouth every 6 (six) hours as needed. 04/29/14   [provider]  imipramine (TOFRANIL) 25 MG tablet Take 1 tablet (25 mg total) by mouth at bedtime. 10/02/21   Sater, Nanine Means, MD  levothyroxine (SYNTHROID) 112 MCG tablet Take 1 tablet (112 mcg total) by mouth daily. 11/29/21   Burchette, Alinda Sierras, MD  Multiple Vitamins-Minerals (PRESERVISION AREDS 2 PO) Take 2 tablets by mouth daily in the afternoon.    [provider]  mupirocin  ointment (BACTROBAN) 2 % 1 Application 2 (two) times daily.    [provider]  omeprazole (PRILOSEC) 40 MG capsule TAKE 1 CAPSULE TWICE A DAY 10/16/21   Esterwood, Amy S, PA-C  polyethylene glycol (MIRALAX / GLYCOLAX) packet Take 17 g by mouth daily as needed.     [provider]  pramipexole (MIRAPEX) 0.125 MG tablet Take 1 to 2 tablets by mouth nightly as needed for restless legs 03/30/22   Burchette, Alinda Sierras, MD  pregabalin (LYRICA) 150 MG capsule Take 1 capsule (150 mg total) by mouth 2 (two) times daily. 02/15/22   Hyatt, Max T, DPM  promethazine-dextromethorphan (PROMETHAZINE-DM) 6.25-15 MG/5ML syrup Take 5 mLs by mouth 4 (four) times daily as needed for cough. At night 03/14/22   Panosh, Standley Brooking, MD  Propylene Glycol (SYSTANE COMPLETE OP) Apply to eye at bedtime.     [provider]  Psyllium 30.9 % POWD Take by mouth. One tsp daily at bedtime    [provider]  sucralfate (CARAFATE) 1 g tablet TAKE 1 TABLET (1 G TOTAL) BY MOUTH 3 (THREE) TIMES DAILY BETWEEN MEALS. 09/19/21   Esterwood, Amy S, PA-C  Vibegron (GEMTESA) 75 MG TABS Take 1 tablet by mouth daily.    [provider]  vitamin B-12 (CYANOCOBALAMIN) 1000 MCG tablet Take 1,000 mcg by mouth daily.    [provider]      Allergies    Aspirin  Review of Systems   Review of Systems  Constitutional:  Negative for chills and fever.  HENT:  Negative for rhinorrhea and sore throat.   Eyes:  Negative for visual disturbance.  Respiratory:  Negative for cough and shortness of breath.   Cardiovascular:  Negative for chest pain and leg swelling.  Gastrointestinal:  Negative for abdominal pain, diarrhea, nausea and vomiting.  Genitourinary:  Negative for dysuria.  Musculoskeletal:  Positive for neck pain. Negative for back pain.  Skin:  Positive for wound. Negative for rash.  Neurological:  Positive for headaches. Negative for dizziness and light-headedness.  Hematological:  Does not  bruise/bleed easily.  Psychiatric/Behavioral:  Negative for confusion.     Physical Exam Updated Vital Signs BP 108/87 (BP Location: Left Arm)   Pulse 87   Temp 98 F (36.7 C)   Resp 20   Ht 1.626 m ('5\' 4"'$ )   Wt 65.8 kg   SpO2 95%   BMI 24.89 kg/m  Physical Exam Vitals and nursing note reviewed.  Constitutional:      General: She is not in acute distress.    Appearance: Normal appearance. She is well-developed. She is not ill-appearing.  HENT:     Head: Normocephalic and atraumatic.     Comments: Right parietal-occipital area about a 5 mm wound that is sealed.  No active bleeding.    Mouth/Throat:     Mouth: Mucous membranes are moist.  Eyes:     Extraocular Movements: Extraocular movements intact.     Conjunctiva/sclera: Conjunctivae normal.     Pupils: Pupils are equal, round, and reactive to light.  Neck:     Comments: Questionable mild tenderness posterior cervical spine. Cardiovascular:     Rate and Rhythm: Normal rate and regular rhythm.     Heart sounds: No murmur heard. Pulmonary:     Effort: Pulmonary effort is normal. No respiratory distress.     Breath sounds: Normal breath sounds.  Abdominal:     Palpations: Abdomen is soft.     Tenderness: There is no abdominal tenderness.  Musculoskeletal:        General: No swelling or deformity.     Cervical back: Normal range of motion and neck supple. Tenderness present. No rigidity.     Comments: Some tenderness to palpation left hip area.  But excellent range of motion at the hip knee and ankle.  Dorsalis pedis pulse is 1+.  Sensation intact.  No evidence of any other upper extremity or lower extremity injury.  Skin:    General: Skin is warm and dry.     Capillary Refill: Capillary refill takes less than 2 seconds.  Neurological:     General: No focal deficit present.     Mental Status: She is alert and oriented to person, place, and time.     Cranial Nerves: No cranial nerve deficit.     Sensory: No sensory  deficit.     Motor: No weakness.  Psychiatric:        Mood and Affect: Mood normal.     ED Results / Procedures / Treatments   Labs (all labs ordered are listed, but only abnormal results are displayed) Labs Reviewed - No data to display  EKG None  Radiology CT Cervical Spine Wo Contrast  Result Date: 04/04/2022 CLINICAL DATA:  Acute neck pain.  No red flags.  Head trauma. EXAM: CT CERVICAL SPINE WITHOUT CONTRAST TECHNIQUE: Multidetector CT imaging of the cervical spine was performed without intravenous contrast. Multiplanar CT image  reconstructions were also generated. RADIATION DOSE REDUCTION: This exam was performed according to the departmental dose-optimization program which includes automated exposure control, adjustment of the mA and/or kV according to patient size and/or use of iterative reconstruction technique. COMPARISON:  None Available. FINDINGS: Alignment: Normal alignment. Skull base and vertebrae: No vertebral compression deformities. No focal bone lesion or bone destruction. Skull base appears intact. Soft tissues and spinal canal: No prevertebral soft tissue swelling. No abnormal paraspinal soft tissue mass or hematoma. Disc levels: Degenerative changes with disc space narrowing and endplate osteophyte formation throughout. Degenerative changes in the facet joints. Upper chest: Lung apices are clear. Other: None. IMPRESSION: Normal alignment. No acute displaced fractures identified. Diffuse degenerative changes. Electronically Signed   By: Lucienne Capers M.D.   On: 04/04/2022 17:22   CT Head Wo Contrast  Result Date: 04/04/2022 CLINICAL DATA:  Head trauma.  No focal neurological findings. EXAM: CT HEAD WITHOUT CONTRAST TECHNIQUE: Contiguous axial images were obtained from the base of the skull through the vertex without intravenous contrast. RADIATION DOSE REDUCTION: This exam was performed according to the departmental dose-optimization program which includes automated  exposure control, adjustment of the mA and/or kV according to patient size and/or use of iterative reconstruction technique. COMPARISON:  MRI brain 02/20/2017.  CT head 01/08/2011 FINDINGS: Brain: Mild cerebral atrophy. No ventricular dilatation. Low-attenuation changes in the deep white matter consistent with small vessel ischemia. No mass-effect or midline shift. No abnormal extra-axial fluid collections. Gray-white matter junctions are distinct. Basal cisterns are not effaced. No acute intracranial hemorrhage. Vascular: No hyperdense vessel or unexpected calcification. Skull: Calvarium appears intact. Sinuses/Orbits: Paranasal sinuses and mastoid air cells are clear. 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 Other: Subcutaneous scalp hematoma over the right posterior parietal region. IMPRESSION: 1. No acute intracranial abnormalities. 2. Chronic atrophy and small vessel ischemic changes. 3. Subcutaneous scalp hematoma over the right posterior parietal region. Electronically Signed   By: Lucienne Capers M.D.   On: 04/04/2022 17:19   DG Hip Unilat W or Wo Pelvis 2-3 Views Left  Result Date: 04/04/2022 CLINICAL DATA:  Fall, left hip pain EXAM: DG HIP (WITH OR WITHOUT PELVIS) 2-3V LEFT COMPARISON:  None Available. FINDINGS: Osteopenia. There is no evidence of displaced hip fracture or dislocation. Mild, symmetric bilateral femoroacetabular arthrosis. Nonobstructive pattern of overlying bowel gas. IMPRESSION: Osteopenia. No evidence of displaced hip fracture or dislocation. Please note that plain radiographs are significantly insensitive for hip and pelvic fracture. Recommend CT or MRI to more sensitively evaluate if there is high clinical suspicion for fracture. Electronically Signed   By: Delanna Ahmadi M.D.   On: 04/04/2022 17:07    Procedures Procedures    Medications Ordered in ED Medications - No data to display  ED Course/ Medical Decision Making/ A&P                            Medical Decision Making Amount and/or Complexity of Data Reviewed Radiology: ordered.   Patient's right occipital parietal wound about 5 mm.  No suturing or stapling required.  Patient tetanus is up-to-date.  Just local wound care would be appropriate.  CT head and neck without any acute findings.  X-ray of the left hip without any bony abnormalities to include the pelvis.  Clinically not concerned about injury.  Patient has been ambulatory on that left leg.  And has excellent range of motion without any significant pain.   Final Clinical Impression(s) /  ED Diagnoses Final diagnoses:  Fall, initial encounter  Injury of head, initial encounter  Laceration of scalp, initial encounter  Contusion of left hip, initial encounter    Rx / DC Orders ED Discharge Orders     None         Fredia Sorrow, MD 04/04/22 1742

## 2022-04-04 NOTE — Discharge Instructions (Signed)
CT head and neck without any brain injury or any skull or bony neck injury.  X-ray of the left hip without any bony injury.  Laceration to the scalp area is very small.  About 5 mm.  Just dressed with antibiotic ointment, wash with soap and water daily.  Follow-up with your doctors as needed.  Expect to be sore and stiff tomorrow.

## 2022-04-16 ENCOUNTER — Telehealth: Payer: Self-pay | Admitting: Family Medicine

## 2022-04-16 DIAGNOSIS — G2581 Restless legs syndrome: Secondary | ICD-10-CM

## 2022-04-16 MED ORDER — ROPINIROLE HCL 0.25 MG PO TABS: ORAL_TABLET | ORAL | 0 refills | Status: DC

## 2022-04-16 NOTE — Telephone Encounter (Signed)
Patient was also informed of the message below

## 2022-04-16 NOTE — Telephone Encounter (Signed)
Patient informed rx was sent and Prescription written for cane

## 2022-04-16 NOTE — Telephone Encounter (Signed)
Pt states the medication for her restless leg syndrome is causing insomnia, she has taken the medication 4 times and each time she could not get a good night's sleep. Wonders if there is another medication that she can try. Also patient says you wrote a prescription for a cane and the company would not accept it because there was no diagnosis code. Needs a new prescription with the diagnosis code. Pls call patient when this is written so that she can pick it up.

## 2022-04-16 NOTE — Addendum Note (Signed)
Addended by: Nilda Riggs on: 04/16/2022 01:40 PM   Modules accepted: Orders

## 2022-04-16 NOTE — Telephone Encounter (Signed)
Rx sent and Order placed for walker. Left message for pt to return my call.

## 2022-04-18 ENCOUNTER — Other Ambulatory Visit: Payer: Self-pay

## 2022-04-18 MED ORDER — LEVOTHYROXINE SODIUM 112 MCG PO TABS: 112.0000 ug | ORAL_TABLET | Freq: Every day | ORAL | 3 refills | Status: DC

## 2022-04-21 DIAGNOSIS — Z0130 Encounter for examination of blood pressure without abnormal findings: Secondary | ICD-10-CM | POA: Diagnosis not present

## 2022-04-21 DIAGNOSIS — B338 Other specified viral diseases: Secondary | ICD-10-CM | POA: Diagnosis not present

## 2022-04-21 DIAGNOSIS — Z20822 Contact with and (suspected) exposure to covid-19: Secondary | ICD-10-CM | POA: Diagnosis not present

## 2022-04-21 DIAGNOSIS — J069 Acute upper respiratory infection, unspecified: Secondary | ICD-10-CM | POA: Diagnosis not present

## 2022-04-21 DIAGNOSIS — J029 Acute pharyngitis, unspecified: Secondary | ICD-10-CM | POA: Diagnosis not present

## 2022-04-21 DIAGNOSIS — Z6826 Body mass index (BMI) 26.0-26.9, adult: Secondary | ICD-10-CM | POA: Diagnosis not present

## 2022-04-21 DIAGNOSIS — R030 Elevated blood-pressure reading, without diagnosis of hypertension: Secondary | ICD-10-CM | POA: Diagnosis not present

## 2022-04-21 DIAGNOSIS — R059 Cough, unspecified: Secondary | ICD-10-CM | POA: Diagnosis not present

## 2022-04-21 DIAGNOSIS — U071 COVID-19: Secondary | ICD-10-CM | POA: Diagnosis not present

## 2022-04-26 DIAGNOSIS — Z87440 Personal history of urinary (tract) infections: Secondary | ICD-10-CM | POA: Diagnosis not present

## 2022-04-26 DIAGNOSIS — N3941 Urge incontinence: Secondary | ICD-10-CM | POA: Diagnosis not present

## 2022-04-30 DIAGNOSIS — Z0130 Encounter for examination of blood pressure without abnormal findings: Secondary | ICD-10-CM | POA: Diagnosis not present

## 2022-04-30 DIAGNOSIS — R059 Cough, unspecified: Secondary | ICD-10-CM | POA: Diagnosis not present

## 2022-04-30 DIAGNOSIS — B9689 Other specified bacterial agents as the cause of diseases classified elsewhere: Secondary | ICD-10-CM | POA: Diagnosis not present

## 2022-04-30 DIAGNOSIS — Z6827 Body mass index (BMI) 27.0-27.9, adult: Secondary | ICD-10-CM | POA: Diagnosis not present

## 2022-04-30 DIAGNOSIS — J329 Chronic sinusitis, unspecified: Secondary | ICD-10-CM | POA: Diagnosis not present

## 2022-04-30 DIAGNOSIS — J029 Acute pharyngitis, unspecified: Secondary | ICD-10-CM | POA: Diagnosis not present

## 2022-05-08 ENCOUNTER — Ambulatory Visit (INDEPENDENT_AMBULATORY_CARE_PROVIDER_SITE_OTHER): Payer: Medicare Other | Admitting: Family Medicine

## 2022-05-08 ENCOUNTER — Ambulatory Visit (HOSPITAL_BASED_OUTPATIENT_CLINIC_OR_DEPARTMENT_OTHER)
Admission: RE | Admit: 2022-05-08 | Discharge: 2022-05-08 | Disposition: A | Discharge status: Home / Self Care | Payer: Medicare Other | Source: Ambulatory Visit | Attending: Family Medicine | Admitting: Family Medicine

## 2022-05-08 VITALS — BP 140/80 | HR 92 | Temp 97.8°F | Ht 64.0 in | Wt 149.6 lb

## 2022-05-08 DIAGNOSIS — R059 Cough, unspecified: Secondary | ICD-10-CM

## 2022-05-08 MED ORDER — BENZONATATE 100 MG PO CAPS: 100.0000 mg | ORAL_CAPSULE | Freq: Three times a day (TID) | ORAL | 0 refills | Status: DC | PRN

## 2022-05-08 NOTE — Progress Notes (Signed)
Established Patient Office Visit  Subjective   [name], female    DOB: 1945-08-24  Age: 76 y.o. MRN: [id number]  Chief Complaint  Patient presents with   Sore Throat    Patient complains of sore throat, Tried Prednisone with little relief, Productive cough with grey sputum     HPI   Cassidy Norton is seen with persistent upper respiratory symptoms.  She states she had onset around December 9 of sore throat followed by nasal congestion.  She went to [facility] which is urgent care and was diagnosed with a "sinus infection".  She apparently was given IM steroids and some type of IM antibiotic but no oral antibiotics.  She felt a little better but then 9 days later went back to the same urgent care with similar symptoms.  At this point she was given oral antibiotics presumably with [facility] and also oral prednisone taper.  She still has 1 day left of antibiotic.  Her main complaint at this time is ongoing cough.  Mostly nonproductive.  She has tried over-the-counter medications such as Delsym without relief.  She quit smoking 2009.  No recent dyspnea.  No fever.  No hemoptysis.  No appetite or weight changes.  Past Medical History:  Diagnosis Date   Abdominal pain, epigastric 08/22/2009   Adenomatous colon polyp    Chronic low back pain    Diverticulosis    Duodenal ulcer    GERD (gastroesophageal reflux disease)    Hemorrhoids    Hiatal hernia    HYPOTHYROIDISM 07/20/2008   Insomnia    Keratosis 09/11/2020   Right Hand   NSVD (normal spontaneous vaginal delivery)    X2   Past Surgical History:  Procedure Laterality Date   ABDOMINAL HYSTERECTOMY     TVH   BACK SURGERY     RUPTURED DISC   BLEPHAROPLASTY     CHOLECYSTECTOMY     COLONOSCOPY  JAN 2014   NEXT COLONOSCOPY IN 10 YEARS   DILATION AND CURETTAGE OF UTERUS     EYE SURGERY  2008   BILATERAL CATARACT    SHOULDER SURGERY Right 05/20/2020   [facility] Dr Onnie Graham    reports that she quit  smoking about 18 years ago. Her smoking use included cigarettes. She has a 60.00 pack-year smoking history. She has never used smokeless tobacco. She reports that she does not drink alcohol and does not use drugs. family history includes Cancer in her father and mother; Diabetes in her brother, mother, and sister; Heart disease in her brother, daughter, and father; Hypertension in her brother and sister. Allergies  Allergen Reactions   Aspirin     REACTION: Hives    Review of Systems  Constitutional:  Negative for chills, fever and weight loss.  HENT:  Positive for congestion.   Respiratory:  Positive for cough.   Cardiovascular:  Negative for chest pain.  Gastrointestinal:  Negative for abdominal pain.      Objective:     BP (!) 140/80 (BP Location: Left Arm, Patient Position: Sitting, Cuff Size: Normal)   Pulse 92   Temp 97.8 F (36.6 C) (Oral)   Ht '5\' 4"'$  (1.626 m)   Wt 149 lb 9.6 oz (67.9 kg)   SpO2 98%   BMI 25.68 kg/m    Physical Exam Vitals reviewed.  Constitutional:      General: She is not in acute distress.    Appearance: She is well-developed. She is not ill-appearing.  HENT:  Mouth/Throat:     Mouth: Mucous membranes are moist.  Cardiovascular:     Rate and Rhythm: Normal rate.  Pulmonary:     Effort: Pulmonary effort is normal.     Breath sounds: Normal breath sounds. No wheezing or rales.  Neurological:     Mental Status: She is alert.      No results found for any visits on 05/08/22.    The 10-year ASCVD risk score (Arnett DK, et al., 2019) is: 24%    Assessment & Plan:   Problem List Items Addressed This Visit   None Visit Diagnoses     Cough, unspecified type    -  Primary   Relevant Orders   DG Chest 2 View     Cassidy Norton is a 76 year old ex-smoker who presents with approximately 3-week history of upper respiratory symptoms including cough.  She does not have any red flags such as fever, dyspnea, hemoptysis, weight loss,  etc.  -We decided to go ahead with PA and lateral chest x-ray given her past history of smoking -No further antibiotics at this time unless dictated by chest x-ray results -Finish out her current prescription with Omnicef. -Try Tessalon Perles 100 mg 1-2 every 8 hours as needed for cough -Follow-up immediately for any fever, increased shortness of breath, or other concerns.  No follow-ups on file.    Carolann Littler, MD

## 2022-05-09 ENCOUNTER — Telehealth: Payer: Self-pay | Admitting: Family Medicine

## 2022-05-09 NOTE — Telephone Encounter (Signed)
Pt had OV 05/08/22, feels she is getting worse. Has sore throat, possibly from cough, cough, congestion, headache, asking if she needs to return to clinic or if you can send something in to assist. Says her grandchildren tested positive for RSV today and she was with them over the weekend.

## 2022-05-09 NOTE — Telephone Encounter (Signed)
Patient informed of the message below and verbalized understanding.  

## 2022-06-01 ENCOUNTER — Encounter: Payer: Self-pay | Admitting: Pulmonary Disease

## 2022-06-01 ENCOUNTER — Ambulatory Visit (INDEPENDENT_AMBULATORY_CARE_PROVIDER_SITE_OTHER): Payer: Medicare Other | Admitting: Pulmonary Disease

## 2022-06-01 VITALS — BP 122/64 | HR 74 | Ht 63.0 in | Wt 152.4 lb

## 2022-06-01 DIAGNOSIS — R051 Acute cough: Secondary | ICD-10-CM | POA: Diagnosis not present

## 2022-06-01 DIAGNOSIS — R0981 Nasal congestion: Secondary | ICD-10-CM

## 2022-06-01 MED ORDER — FLUTICASONE PROPIONATE 50 MCG/ACT NA SUSP: NASAL | 2 refills | Status: DC

## 2022-06-01 NOTE — Progress Notes (Signed)
$'@Patient'B$  ID: Cassidy Norton, female    DOB: August 12, 1945, 77 y.o.   MRN: [id number]  Chief Complaint  Patient presents with   Consult    Pt is here for consult for cough. Pt states that she no longer has a cough. She had 2 sinus infections in October and December. Was given zpak and prednisone in December and the cough started after that. Chest xray was done by pcp 12/26. She states that the cough stopped last week.     Referring provider: Eulas Post, MD  HPI:   77 y.o. we are seeing for evaluation of acute cough.  Most recent PCP note x 2 reviewed.  Urgent care note reviewed.  Patient triglycerides and viral illness 03/2022.  Cough and sinus congestion.  Got a round of antibiotics and steroids.  Curiously, seems to make the cough worse.  Symptoms lingered.  Seen by PCP late 04/2022.  Was finishing up another course of antibiotics.  Chest x-ray obtained at that time on my review and interpretation reveals clear lungs bilaterally, no evidence of hyperinflation.  Steadily, over time cough is improved.  Now essentially resolved.  Little cough in the morning productive of phlegm.  She endorses a lot of sinus congestion.  Postnasal drip.  Feeling of fullness, congestion in the back of her throat throughout the day.  She is using saline nasal spray but nothing else.  She was very concerned about the cough and lung cancer.  She was reassured by normal chest imaging.  I shared the same reassurance.  She denies any significant shortness of breath or dyspnea prior.  Really during this illness not much.  But certainly in the preceding months or years no issues with shortness of breath.  No issues with cough either.  No chronic cough or recurrent cough.  No history of recurrent bronchitis.  She did have pneumonia many years ago.  But no issues with chronic cough.  No concern for symptoms related to her history of tobacco abuse now in remission.   PMH: Seasonal allergies Surgical history:  Cholecystectomy, back surgery, hysterectomy Family history: CAD, cancer in first-degree relatives Social history: Former smoker, Building control surveyor, quit 2009, lives in [place] / Pulmonary Flowsheets:   ACT:      No data to display          MMRC:     No data to display          Epworth:      No data to display          Tests:   FENO:  No results found for: "NITRICOXIDE"  PFT:     No data to display          WALK:      No data to display          Imaging: Personally reviewed and as per EMR and discussion in this note DG Chest 2 View  Result Date: 05/08/2022 CLINICAL DATA:  Cough for 4 weeks. EXAM: CHEST - 2 VIEW COMPARISON:  November 06, 2017 FINDINGS: The heart size and mediastinal contours are within normal limits. Both lungs are clear. Stable mild biapical pleural thickening noted. The visualized skeletal structures are unremarkable. IMPRESSION: No active cardiopulmonary disease. Electronically Signed   By: Abelardo Diesel M.D.   On: 05/08/2022 13:59    Lab Results: Personally reviewed CBC    Component Value Date/Time   WBC 8.6 03/06/2022 1153   RBC 4.62 03/06/2022 1153  HGB 14.6 03/06/2022 1153   HCT 43.1 03/06/2022 1153   PLT 227.0 03/06/2022 1153   MCV 93.3 03/06/2022 1153   MCHC 33.8 03/06/2022 1153   RDW 13.6 03/06/2022 1153   LYMPHSABS 3.8 03/06/2022 1153   MONOABS 0.6 03/06/2022 1153   EOSABS 0.1 03/06/2022 1153   BASOSABS 0.0 03/06/2022 1153    BMET    Component Value Date/Time   NA 142 03/06/2022 1153   K 3.9 03/06/2022 1153   CL 103 03/06/2022 1153   CO2 29 03/06/2022 1153   GLUCOSE 83 03/06/2022 1153   BUN 7 03/06/2022 1153   CREATININE 0.59 03/06/2022 1153   CALCIUM 9.8 03/06/2022 1153    BNP No results found for: "BNP"  ProBNP No results found for: "PROBNP"  Specialty Problems       Pulmonary Problems   Acute upper respiratory infections of unspecified site   Sore throat    Viral versus other  symptomatic treatment follow, parent history of thrush no dysphasia otherwise       Allergies  Allergen Reactions   Aspirin     REACTION: Hives    Immunization History  Administered Date(s) Administered   Fluad Quad(high Dose 65+) 02/03/2019, 02/04/2020, 02/20/2021, 03/06/2022   Influenza Split 03/12/2012   Influenza, High Dose Seasonal PF 04/12/2015, 02/10/2016, 02/08/2017, 02/12/2018   Influenza,inj,Quad PF,6+ Mos 02/03/2013, 02/03/2014   Influenza,inj,quad, With Preservative 02/12/2019, 02/12/2020   MODERNA COVID-19 SARS-COV-2 PEDS BIVALENT BOOSTER 6Y-11Y 09/10/2019   Pneumococcal Conjugate-13 09/21/2013   Pneumococcal Polysaccharide-23 04/12/2015   Tdap 07/24/2012, 11/29/2021    Past Medical History:  Diagnosis Date   Abdominal pain, epigastric 08/22/2009   Adenomatous colon polyp    Chronic low back pain    Diverticulosis    Duodenal ulcer    GERD (gastroesophageal reflux disease)    Hemorrhoids    Hiatal hernia    HYPOTHYROIDISM 07/20/2008   Insomnia    Keratosis 09/11/2020   Right Hand   NSVD (normal spontaneous vaginal delivery)    X2    Tobacco History: Social History   Tobacco Use  Smoking Status Former   Packs/day: 1.50   Years: 40.00   Total pack years: 60.00   Types: Cigarettes   Quit date: 08/28/2003   Years since quitting: 18.7  Smokeless Tobacco Never   Counseling given: Not Answered   Continue to not smoke  Outpatient Encounter Medications as of 06/01/2022  Medication Sig   cycloSPORINE (RESTASIS) 0.05 % ophthalmic emulsion Place 1 drop into the left eye 2 (two) times daily.   estradiol (ESTRACE) 0.5 MG tablet TAKE ONE-HALF (1/2) TABLET EVERY OTHER DAY   fluticasone (FLONASE) 50 MCG/ACT nasal spray Use 1 spray each nostril twice a day for 7 days then decrease to 1 spray each nostril once a day for 7 days.  Can repeat as needed for nasal congestion.   HYDROcodone-acetaminophen (NORCO) 10-325 MG per tablet Take 0.5 tablets by mouth every 6  (six) hours as needed.   levothyroxine (SYNTHROID) 112 MCG tablet Take 1 tablet (112 mcg total) by mouth daily.   Multiple Vitamins-Minerals (PRESERVISION AREDS 2 PO) Take 2 tablets by mouth daily in the afternoon.   mupirocin ointment (BACTROBAN) 2 % 1 Application 2 (two) times daily.   omeprazole (PRILOSEC) 40 MG capsule TAKE 1 CAPSULE TWICE A DAY   polyethylene glycol (MIRALAX / GLYCOLAX) packet Take 17 g by mouth daily as needed.    pramipexole (MIRAPEX) 0.125 MG tablet Take 0.125-0.25 mg by mouth at bedtime as  needed.   pregabalin (LYRICA) 150 MG capsule Take 1 capsule (150 mg total) by mouth 2 (two) times daily.   promethazine-dextromethorphan (PROMETHAZINE-DM) 6.25-15 MG/5ML syrup Take 5 mLs by mouth 4 (four) times daily as needed for cough. At night   Propylene Glycol (SYSTANE COMPLETE OP) Apply to eye at bedtime.    Psyllium 30.9 % POWD Take by mouth. One tsp daily at bedtime   rOPINIRole (REQUIP) 0.25 MG tablet Take 1 tablet by mouth at night for 2 days then increase to 0.5 mg or 2 tablets by mouth at night   sucralfate (CARAFATE) 1 g tablet TAKE 1 TABLET (1 G TOTAL) BY MOUTH 3 (THREE) TIMES DAILY BETWEEN MEALS.   Vibegron (GEMTESA) 75 MG TABS Take 1 tablet by mouth daily.   vitamin B-12 (CYANOCOBALAMIN) 1000 MCG tablet Take 1,000 mcg by mouth daily.   [DISCONTINUED] cefdinir (OMNICEF) 300 MG capsule Take 300 mg by mouth 2 (two) times daily.   [DISCONTINUED] predniSONE (STERAPRED UNI-PAK 21 TAB) 10 MG (21) TBPK tablet Take by mouth as directed.   benzonatate (TESSALON PERLES) 100 MG capsule Take 1-2 capsules (100-200 mg total) by mouth 3 (three) times daily as needed for cough. (Patient not taking: Reported on 06/01/2022)   No facility-administered encounter medications on file as of 06/01/2022.     Review of Systems  Review of Systems  No chest pain with exertion.  No orthopnea or PND.  Comprehensive review of systems otherwise negative. Physical Exam  BP 122/64 (BP Location:  Left Arm, Patient Position: Sitting, Cuff Size: Normal)   Pulse 74   Ht '5\' 3"'$  (1.6 m)   Wt 152 lb 6.4 oz (69.1 kg)   SpO2 97%   BMI 27.00 kg/m   Wt Readings from Last 5 Encounters:  06/01/22 152 lb 6.4 oz (69.1 kg)  05/08/22 149 lb 9.6 oz (67.9 kg)  04/04/22 145 lb (65.8 kg)  03/14/22 146 lb 6.4 oz (66.4 kg)  03/06/22 151 lb 11.2 oz (68.8 kg)    BMI Readings from Last 5 Encounters:  06/01/22 27.00 kg/m  05/08/22 25.68 kg/m  04/04/22 24.89 kg/m  03/14/22 25.13 kg/m  03/06/22 26.04 kg/m     Physical Exam General: Sitting in chair, no acute distress Eyes: EOMI, no icterus Neck: Supple, no JVP Pulmonary: Clear, normal work of breathing Cardiovascular: Warm, no edema Abdomen: Nondistended, bowel sounds present HPI: No synovitis, no joint effusion Neuro: Normal gait, no weakness Psych: Normal, full affect   Assessment & Plan:   Cough: Acute to subacute.  Sounds like triggered by likely viral illness, quite likely she contracted multiple viral illnesses over the holidays as many people had.  Treated with steroids, antibiotics.  Chest imaging clear 04/2022.  Cough is now better.  No further workup.  Sinus congestion: Lingering with recent illnesses.  Recommend saline twice daily for 7 days then once daily, Flonase twice daily for 7 days then once daily, Afrin twice daily for 3 days then stop.  She seems leery of intranasal therapies.  Discussed that is unlikely to improve with at least trying this or other intranasal therapies.  Encouraged her to keep her ENT evaluation scheduled for next week for further evaluation.  Tobacco abuse in remission: Historically no issues with cough or dyspnea on exertion.  If this were to become a issue in the future would recommend PFTs and further evaluation in clinic.   Return if symptoms worsen or fail to improve.   Lanier Clam, MD 06/01/2022   This  appointment required 60 minutes of patient care (this includes precharting,  chart review, review of results, face-to-face care, etc.).

## 2022-06-01 NOTE — Patient Instructions (Signed)
Nice to meet you  It sounds like he picked up a viral illness and subsequently developed worsening symptoms over time.  Fortunately, it sounds like the cough is improved.  This often happens with time and recovery from illness.  For the sinus congestion, I recommend the following:  1) NeilMed sinus rinse, use as directed with saline packets, the bottle and saline/salt packets can be found in the cold and flu aisle.  Mix with purified or distilled water.  Do this twice a day for 1 week then once a day for 1 week.  2) Flonase 1 spray each nostril twice a day for 1 week and once a day for 1 week.  3) Afrin 2 sprays each nostril twice a day for 3 days then stop  Make sure to use the Flonase and Afrin after the sinus rinses so that the sinus rinses do not just flush out all the medication  Your chest x-ray is clear which is reassuring  Follow-up with ENT as scheduled  Return to clinic as needed

## 2022-06-05 DIAGNOSIS — R070 Pain in throat: Secondary | ICD-10-CM | POA: Diagnosis not present

## 2022-06-05 DIAGNOSIS — K219 Gastro-esophageal reflux disease without esophagitis: Secondary | ICD-10-CM | POA: Diagnosis not present

## 2022-07-05 ENCOUNTER — Telehealth: Payer: Medicare Other | Admitting: Family Medicine

## 2022-07-13 ENCOUNTER — Ambulatory Visit (INDEPENDENT_AMBULATORY_CARE_PROVIDER_SITE_OTHER): Payer: Medicare Other | Admitting: Family Medicine

## 2022-07-13 VITALS — BP 120/60 | HR 73 | Temp 98.6°F | Ht 63.0 in | Wt 152.8 lb

## 2022-07-13 DIAGNOSIS — J0100 Acute maxillary sinusitis, unspecified: Secondary | ICD-10-CM | POA: Diagnosis not present

## 2022-07-13 DIAGNOSIS — R059 Cough, unspecified: Secondary | ICD-10-CM | POA: Diagnosis not present

## 2022-07-13 DIAGNOSIS — J302 Other seasonal allergic rhinitis: Secondary | ICD-10-CM

## 2022-07-13 DIAGNOSIS — M7061 Trochanteric bursitis, right hip: Secondary | ICD-10-CM | POA: Diagnosis not present

## 2022-07-13 LAB — POC COVID19 BINAXNOW: SARS Coronavirus 2 Ag: NEGATIVE

## 2022-07-13 MED ORDER — AMOXICILLIN-POT CLAVULANATE 500-125 MG PO TABS: 1.0000 | ORAL_TABLET | Freq: Two times a day (BID) | ORAL | 0 refills | Status: AC

## 2022-07-13 MED ORDER — BENZONATATE 100 MG PO CAPS: 100.0000 mg | ORAL_CAPSULE | Freq: Two times a day (BID) | ORAL | 0 refills | Status: DC | PRN

## 2022-07-13 MED ORDER — FEXOFENADINE HCL 60 MG PO TABS: 60.0000 mg | ORAL_TABLET | Freq: Every day | ORAL | 0 refills | Status: DC

## 2022-07-13 NOTE — Progress Notes (Signed)
Established Patient Office Visit   Subjective  [name], female    DOB: [birth year]  Age: 77 y.o. MRN: [id number]  Chief Complaint  Patient presents with   Cough   Sinus Problem    Pt reports sx of pressure on fore head,facial pressure,  eyes burning, dry mouth. Going second wk. Taking mucinex D.     Pt is a 77 yo female with pmh sig for GERD, hypothyroidism, followed by Dr. Elease Hashimoto and seen for acute concern.  Patient endorses dry, hacking cough, nasal congestion, and head pressure x 1 week.  Notes mouth gets dry and has increased nasal drainage.  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  Requesting refill on Tessalon.    Patient Active Problem List   Diagnosis Date Noted   Peripheral neuropathy 03/23/2019   B12 deficiency 02/12/2018   GERD (gastroesophageal reflux disease) 11/06/2017   Osteopenia 09/13/2015   Ovarian cyst, right 08/16/2014   Sore throat 07/27/2013   NSAID long-term use 07/27/2013   Acute upper respiratory infections of unspecified site 07/23/2013   Vertigo 07/23/2013   Vaginal atrophy 08/07/2012   Weight gain 07/24/2012   Postmenopausal HRT (hormone replacement therapy) 07/24/2012   Abdominal pain, epigastric 08/22/2009   Hypothyroidism 07/20/2008   Social History   Tobacco Use   Smoking status: Former    Packs/day: 1.50    Years: 40.00    Additional pack years: 0.00    Total pack years: 60.00    Types: Cigarettes    Quit date: 08/28/2003    Years since quitting: 18.9   Smokeless tobacco: Never  Vaping Use   Vaping Use: Never used  Substance Use Topics   Alcohol use: No    Alcohol/week: 0.0 standard drinks of alcohol   Drug use: No   Allergies  Allergen Reactions   Aspirin     REACTION: Hives    ROS Negative unless stated above    Objective:     BP 120/60 (BP Location: Right Arm, Patient Position: Sitting, Cuff Size: Normal)   Pulse 73   Temp 98.6 F (37 C) (Oral)   Ht 5\' 3"  (1.6 m)   Wt 152 lb  12.8 oz (69.3 kg)   SpO2 95%   BMI 27.07 kg/m    Physical Exam Constitutional:      General: She is not in acute distress.    Appearance: Normal appearance.  HENT:     Head: Normocephalic and atraumatic.     Right Ear: Tympanic membrane normal.     Left Ear: Tympanic membrane normal.     Nose:     Right Sinus: Maxillary sinus tenderness present.     Left Sinus: Maxillary sinus tenderness present.     Mouth/Throat:     Mouth: Mucous membranes are moist.     Pharynx: No posterior oropharyngeal erythema.     Tonsils: No tonsillar exudate.  Cardiovascular:     Rate and Rhythm: Normal rate and regular rhythm.     Heart sounds: Normal heart sounds. No murmur heard.    No gallop.  Pulmonary:     Effort: Pulmonary effort is normal. No respiratory distress.     Breath sounds: Normal breath sounds. No wheezing, rhonchi or rales.  Skin:    General: Skin is warm and dry.  Neurological:     Mental Status: She is alert and oriented to person, place, and time.      Results for orders placed or performed in visit  on 07/13/22  POC COVID-19  Result Value Ref Range   SARS Coronavirus 2 Ag Negative Negative      Assessment & Plan:  Acute maxillary sinusitis, recurrence not specified -     Amoxicillin-Pot Clavulanate; Take 1 tablet by mouth in the morning and at bedtime for 7 days.  Dispense: 14 tablet; Refill: 0  Cough, unspecified type -     POC COVID-19 BinaxNow -     Benzonatate; Take 1 capsule (100 mg total) by mouth 2 (two) times daily as needed for cough.  Dispense: 20 capsule; Refill: 0  Seasonal allergies -     Fexofenadine HCl; Take 1 tablet (60 mg total) by mouth daily. For allergy symptoms.  Dispense: 30 tablet; Refill: 0   COVID testing negative.  Start ABX for sinusitis.  Tessalon for cough.  Allegra for seasonal allergy symptoms.  Return if symptoms worsen or fail to improve.   Billie Ruddy, MD

## 2022-07-13 NOTE — Patient Instructions (Signed)
A prescription for Augmentin,  an antibiotic for your current sinus infection, was sent to your pharmacy.  A prescription for Tessalon for cough was also sent to the pharmacy along with a prescription for Allegra an allergy medication.  If you do not believe the Allegra is helping you can always try one of the other over-the-counter allergy medication such as Claritin, Zyrtec, or Xyzal.  You can take all of this along with your nasal spray Flonase.

## 2022-07-18 ENCOUNTER — Other Ambulatory Visit: Payer: Self-pay | Admitting: Family Medicine

## 2022-08-08 DIAGNOSIS — H903 Sensorineural hearing loss, bilateral: Secondary | ICD-10-CM | POA: Diagnosis not present

## 2022-08-13 ENCOUNTER — Telehealth: Payer: Self-pay | Admitting: Family Medicine

## 2022-08-13 NOTE — Telephone Encounter (Signed)
Called patient to schedule Medicare Annual Wellness Visit (AWV). Left message for patient to call back and schedule Medicare Annual Wellness Visit (AWV).  Last date of AWV: 07/04/21  Please schedule an appointment at any time with [facility] or [employer].  If any questions, please contact me at [phone].  Thank you ,  Barkley Boards AWV direct phone # [phone]

## 2022-08-15 ENCOUNTER — Encounter: Payer: Self-pay | Admitting: Family Medicine

## 2022-08-16 DIAGNOSIS — I8311 Varicose veins of right lower extremity with inflammation: Secondary | ICD-10-CM | POA: Diagnosis not present

## 2022-08-16 DIAGNOSIS — L570 Actinic keratosis: Secondary | ICD-10-CM | POA: Diagnosis not present

## 2022-08-16 DIAGNOSIS — I872 Venous insufficiency (chronic) (peripheral): Secondary | ICD-10-CM | POA: Diagnosis not present

## 2022-08-16 DIAGNOSIS — L578 Other skin changes due to chronic exposure to nonionizing radiation: Secondary | ICD-10-CM | POA: Diagnosis not present

## 2022-08-16 DIAGNOSIS — I8312 Varicose veins of left lower extremity with inflammation: Secondary | ICD-10-CM | POA: Diagnosis not present

## 2022-08-16 DIAGNOSIS — D1801 Hemangioma of skin and subcutaneous tissue: Secondary | ICD-10-CM | POA: Diagnosis not present

## 2022-08-16 DIAGNOSIS — L821 Other seborrheic keratosis: Secondary | ICD-10-CM | POA: Diagnosis not present

## 2022-08-16 DIAGNOSIS — D225 Melanocytic nevi of trunk: Secondary | ICD-10-CM | POA: Diagnosis not present

## 2022-08-17 ENCOUNTER — Telehealth: Payer: Self-pay | Admitting: Family Medicine

## 2022-08-17 NOTE — Telephone Encounter (Signed)
Opened error

## 2022-08-17 NOTE — Telephone Encounter (Signed)
Contacted Cassidy Norton to schedule their annual wellness visit. Appointment made for 08/20/2022.  Thank you,  [name],  AMB Clinical Support [facility] AWV Program Direct Dial ??[phone]

## 2022-08-20 ENCOUNTER — Ambulatory Visit (INDEPENDENT_AMBULATORY_CARE_PROVIDER_SITE_OTHER): Payer: Medicare Other

## 2022-08-20 VITALS — Ht 63.0 in | Wt 145.0 lb

## 2022-08-20 DIAGNOSIS — Z0000 Encounter for general adult medical examination without abnormal findings: Secondary | ICD-10-CM

## 2022-08-20 NOTE — Patient Instructions (Addendum)
Cassidy Norton , Thank you for taking time to come for your Medicare Wellness Visit. I appreciate your ongoing commitment to your health goals. Please review the following plan we discussed and let me know if I can assist you in the future.   These are the goals we discussed:  Goals       Increase physical activity (pt-stated)      Would like lose weight. Increase walking and work in yard.        This is a list of the screening recommended for you and due dates:  Health Maintenance  Topic Date Due   COVID-19 Vaccine (2 - Moderna risk series) 09/05/2022*   Zoster (Shingles) Vaccine (1 of 2) 11/19/2022*   Mammogram  11/16/2022   Flu Shot  12/13/2022   Medicare Annual Wellness Visit  08/20/2023   DTaP/Tdap/Td vaccine (3 - Td or Tdap) 11/30/2031   Pneumonia Vaccine  Completed   DEXA scan (bone density measurement)  Completed   Hepatitis C Screening: USPSTF Recommendation to screen - Ages 67-79 yo.  Completed   HPV Vaccine  Aged Out   Colon Cancer Screening  Discontinued  *Topic was postponed. The date shown is not the original due date.    Advanced directives: Please bring a copy of your health care power of attorney and living will to the office to be added to your chart at your convenience.   Conditions/risks identified: None  Next appointment: Follow up in one year for your annual wellness visit    Preventive Care 65 Years and Older, Female Preventive care refers to lifestyle choices and visits with your health care provider that can promote health and wellness. What does preventive care include? A yearly physical exam. This is also called an annual well check. Dental exams once or twice a year. Routine eye exams. Ask your health care provider how often you should have your eyes checked. Personal lifestyle choices, including: Daily care of your teeth and gums. Regular physical activity. Eating a healthy diet. Avoiding tobacco and drug use. Limiting alcohol use. Practicing  safe sex. Taking low-dose aspirin every day. Taking vitamin and mineral supplements as recommended by your health care provider. What happens during an annual well check? The services and screenings done by your health care provider during your annual well check will depend on your age, overall health, lifestyle risk factors, and family history of disease. Counseling  Your health care provider may ask you questions about your: Alcohol use. Tobacco use. Drug use. Emotional well-being. Home and relationship well-being. Sexual activity. Eating habits. History of falls. Memory and ability to understand (cognition). Work and work Astronomer. Reproductive health. Screening  You may have the following tests or measurements: Height, weight, and BMI. Blood pressure. Lipid and cholesterol levels. These may be checked every 5 years, or more frequently if you are over 90 years old. Skin check. Lung cancer screening. You may have this screening every year starting at age 8 if you have a 30-pack-year history of smoking and currently smoke or have quit within the past 15 years. Fecal occult blood test (FOBT) of the stool. You may have this test every year starting at age 78. Flexible sigmoidoscopy or colonoscopy. You may have a sigmoidoscopy every 5 years or a colonoscopy every 10 years starting at age 47. Hepatitis C blood test. Hepatitis B blood test. Sexually transmitted disease (STD) testing. Diabetes screening. This is done by checking your blood sugar (glucose) after you have not eaten for a while (  fasting). You may have this done every 1-3 years. Bone density scan. This is done to screen for osteoporosis. You may have this done starting at age 5. Mammogram. This may be done every 1-2 years. Talk to your health care provider about how often you should have regular mammograms. Talk with your health care provider about your test results, treatment options, and if necessary, the need for more  tests. Vaccines  Your health care provider may recommend certain vaccines, such as: Influenza vaccine. This is recommended every year. Tetanus, diphtheria, and acellular pertussis (Tdap, Td) vaccine. You may need a Td booster every 10 years. Zoster vaccine. You may need this after age 59. Pneumococcal 13-valent conjugate (PCV13) vaccine. One dose is recommended after age 64. Pneumococcal polysaccharide (PPSV23) vaccine. One dose is recommended after age 74. Talk to your health care provider about which screenings and vaccines you need and how often you need them. This information is not intended to replace advice given to you by your health care provider. Make sure you discuss any questions you have with your health care provider. Document Released: 05/27/2015 Document Revised: 01/18/2016 Document Reviewed: 03/01/2015 Elsevier Interactive Patient Education  2017 [place] Prevention in the Home Falls can cause injuries. They can happen to people of all ages. There are many things you can do to make your home safe and to help prevent falls. What can I do on the outside of my home? Regularly fix the edges of walkways and driveways and fix any cracks. Remove anything that might make you trip as you walk through a door, such as a raised step or threshold. Trim any bushes or trees on the path to your home. Use bright outdoor lighting. Clear any walking paths of anything that might make someone trip, such as rocks or tools. Regularly check to see if handrails are loose or broken. Make sure that both sides of any steps have handrails. Any raised decks and porches should have guardrails on the edges. Have any leaves, snow, or ice cleared regularly. Use sand or salt on walking paths during winter. Clean up any spills in your garage right away. This includes oil or grease spills. What can I do in the bathroom? Use night lights. Install grab bars by the toilet and in the tub and shower.  Do not use towel bars as grab bars. Use non-skid mats or decals in the tub or shower. If you need to sit down in the shower, use a plastic, non-slip stool. Keep the floor dry. Clean up any water that spills on the floor as soon as it happens. Remove soap buildup in the tub or shower regularly. Attach bath mats securely with double-sided non-slip rug tape. Do not have throw rugs and other things on the floor that can make you trip. What can I do in the bedroom? Use night lights. Make sure that you have a light by your bed that is easy to reach. Do not use any sheets or blankets that are too big for your bed. They should not hang down onto the floor. Have a firm chair that has side arms. You can use this for support while you get dressed. Do not have throw rugs and other things on the floor that can make you trip. What can I do in the kitchen? Clean up any spills right away. Avoid walking on wet floors. Keep items that you use a lot in easy-to-reach places. If you need to reach something above you, use  a strong step stool that has a grab bar. Keep electrical cords out of the way. Do not use floor polish or wax that makes floors slippery. If you must use wax, use non-skid floor wax. Do not have throw rugs and other things on the floor that can make you trip. What can I do with my stairs? Do not leave any items on the stairs. Make sure that there are handrails on both sides of the stairs and use them. Fix handrails that are broken or loose. Make sure that handrails are as long as the stairways. Check any carpeting to make sure that it is firmly attached to the stairs. Fix any carpet that is loose or worn. Avoid having throw rugs at the top or bottom of the stairs. If you do have throw rugs, attach them to the floor with carpet tape. Make sure that you have a light switch at the top of the stairs and the bottom of the stairs. If you do not have them, ask someone to add them for you. What else  can I do to help prevent falls? Wear shoes that: Do not have high heels. Have rubber bottoms. Are comfortable and fit you well. Are closed at the toe. Do not wear sandals. If you use a stepladder: Make sure that it is fully opened. Do not climb a closed stepladder. Make sure that both sides of the stepladder are locked into place. Ask someone to hold it for you, if possible. Clearly mark and make sure that you can see: Any grab bars or handrails. First and last steps. Where the edge of each step is. Use tools that help you move around (mobility aids) if they are needed. These include: Canes. Walkers. Scooters. Crutches. Turn on the lights when you go into a dark area. Replace any light bulbs as soon as they burn out. Set up your furniture so you have a clear path. Avoid moving your furniture around. If any of your floors are uneven, fix them. If there are any pets around you, be aware of where they are. Review your medicines with your doctor. Some medicines can make you feel dizzy. This can increase your chance of falling. Ask your doctor what other things that you can do to help prevent falls. This information is not intended to replace advice given to you by your health care provider. Make sure you discuss any questions you have with your health care provider. Document Released: 02/24/2009 Document Revised: 10/06/2015 Document Reviewed: 06/04/2014 Elsevier Interactive Patient Education  2017 [employer].

## 2022-08-20 NOTE — Progress Notes (Signed)
Subjective:   Cassidy Norton is a 77 y.o. female who presents for Medicare Annual (Subsequent) preventive examination.  Review of Systems    Virtual Visit via Telephone Note  I connected with  Cassidy Norton on 08/20/22 at  8:45 AM EDT by telephone and verified that I am speaking with the correct person using two identifiers.  Location: Patient: Home Provider: [place] Persons participating in the virtual visit: patient/Nurse Health Advisor   I discussed the limitations, risks, security and privacy concerns of performing an evaluation and management service by telephone and the availability of in person appointments. The patient expressed understanding and agreed to proceed.  Interactive audio and video telecommunications were attempted between this nurse and patient, however failed, due to patient having technical difficulties OR patient did not have access to video capability.  We continued and completed visit with audio only.  Some vital signs may be absent or patient reported.   Tillie Rung, LPN  Cardiac Risk Factors include: advanced age (>30men, >93 women)     Objective:    Today's Vitals   08/20/22 0850  Weight: 145 lb (65.8 kg)  Height: 5\' 3"  (1.6 m)   Body mass index is 25.69 kg/m.     08/20/2022    8:58 AM 04/04/2022    4:15 PM 07/04/2021    2:05 PM 10/09/2016    9:11 AM  Advanced Directives  Does Patient Have a Medical Advance Directive? Yes Yes Yes Yes  Type of Estate agent of [place];Living will Living will;Healthcare Power of [employer] Power of [place];Living will Healthcare Power of [place];Living will  Does patient want to make changes to medical advance directive?   No - Patient declined   Copy of Healthcare Power of Attorney in Chart? No - copy requested  No - copy requested     Current Medications (verified) Outpatient Encounter Medications as of 08/20/2022  Medication Sig   benzonatate (TESSALON PERLES) 100 MG  capsule Take 1-2 capsules (100-200 mg total) by mouth 3 (three) times daily as needed for cough. (Patient not taking: Reported on 06/01/2022)   benzonatate (TESSALON) 100 MG capsule Take 1 capsule (100 mg total) by mouth 2 (two) times daily as needed for cough.   cycloSPORINE (RESTASIS) 0.05 % ophthalmic emulsion Place 1 drop into the left eye 2 (two) times daily.   estradiol (ESTRACE) 0.5 MG tablet TAKE ONE-HALF (1/2) TABLET EVERY OTHER DAY   fexofenadine (ALLEGRA ALLERGY) 60 MG tablet Take 1 tablet (60 mg total) by mouth daily. For allergy symptoms.   fluticasone (FLONASE) 50 MCG/ACT nasal spray Use 1 spray each nostril twice a day for 7 days then decrease to 1 spray each nostril once a day for 7 days.  Can repeat as needed for nasal congestion.   HYDROcodone-acetaminophen (NORCO) 10-325 MG per tablet Take 0.5 tablets by mouth every 6 (six) hours as needed.   levothyroxine (SYNTHROID) 112 MCG tablet Take 1 tablet (112 mcg total) by mouth daily.   Multiple Vitamins-Minerals (PRESERVISION AREDS 2 PO) Take 2 tablets by mouth daily in the afternoon.   mupirocin ointment (BACTROBAN) 2 % 1 Application 2 (two) times daily. (Patient not taking: Reported on 07/13/2022)   omeprazole (PRILOSEC) 40 MG capsule TAKE 1 CAPSULE TWICE A DAY   polyethylene glycol (MIRALAX / GLYCOLAX) packet Take 17 g by mouth daily as needed.    pramipexole (MIRAPEX) 0.125 MG tablet Take 0.125-0.25 mg by mouth at bedtime as needed.   pregabalin (LYRICA) 150  MG capsule Take 1 capsule (150 mg total) by mouth 2 (two) times daily.   Propylene Glycol (SYSTANE COMPLETE OP) Apply to eye at bedtime.    Psyllium 30.9 % POWD Take by mouth. One tsp daily at bedtime   rOPINIRole (REQUIP) 0.25 MG tablet TAKE 1 TABLET BY MOUTH AT NIGHT FOR 2 DAYS THEN INCREASE TO 0.5 MG OR 2 TABLETS BY MOUTH AT NIGHT   sucralfate (CARAFATE) 1 g tablet TAKE 1 TABLET (1 G TOTAL) BY MOUTH 3 (THREE) TIMES DAILY BETWEEN MEALS.   Vibegron (GEMTESA) 75 MG TABS Take 1  tablet by mouth daily.   vitamin B-12 (CYANOCOBALAMIN) 1000 MCG tablet Take 1,000 mcg by mouth daily.   No facility-administered encounter medications on file as of 08/20/2022.    Allergies (verified) Aspirin   History: Past Medical History:  Diagnosis Date   Abdominal pain, epigastric 08/22/2009   Adenomatous colon polyp    Chronic low back pain    Diverticulosis    Duodenal ulcer    GERD (gastroesophageal reflux disease)    Hemorrhoids    Hiatal hernia    HYPOTHYROIDISM 07/20/2008   Insomnia    Keratosis 09/11/2020   Right Hand   NSVD (normal spontaneous vaginal delivery)    X2   Past Surgical History:  Procedure Laterality Date   ABDOMINAL HYSTERECTOMY     TVH   BACK SURGERY     RUPTURED DISC   BLEPHAROPLASTY     CHOLECYSTECTOMY     COLONOSCOPY  JAN 2014   NEXT COLONOSCOPY IN 10 YEARS   DILATION AND CURETTAGE OF UTERUS     EYE SURGERY  2006-09-16   BILATERAL CATARACT    SHOULDER SURGERY Right 05/20/2020   [facility] Dr Rennis Chris   Family History  Problem Relation Age of Onset   Cancer Mother        bladder CA   Diabetes Mother    Cancer Father        gallblader CA   Heart disease Father    Diabetes Sister    Hypertension Sister    Diabetes Brother    Hypertension Brother    Heart disease Brother    Heart disease Daughter    Stomach cancer Neg Hx    Colon cancer Neg Hx    Social History   Socioeconomic History   Marital status: Widowed    Spouse name: Not on file   Number of children: 1   Years of education: Not on file   Highest education level: Not on file  Occupational History   Occupation: Retired    Associate Professor: RETIRED  Tobacco Use   Smoking status: Former    Packs/day: 1.50    Years: 40.00    Additional pack years: 0.00    Total pack years: 60.00    Types: Cigarettes    Quit date: 08/28/2003    Years since quitting: 18.9   Smokeless tobacco: Never  Vaping Use   Vaping Use: Never used  Substance and Sexual Activity   Alcohol use: No     Alcohol/week: 0.0 standard drinks of alcohol   Drug use: No   Sexual activity: Not Currently    Partners: Male    Birth control/protection: Surgical    Comment: HYSTERECTOMY  Other Topics Concern   Not on file  Social History Narrative   Lives alone, husband passed away 2020-09-15   R handed   Caffeine: 2 C of coffee in the AM   Social Determinants of Health  Financial Resource Strain: Low Risk  (08/20/2022)   Overall Financial Resource Strain (CARDIA)    Difficulty of Paying Living Expenses: Not hard at all  Food Insecurity: No Food Insecurity (08/20/2022)   Hunger Vital Sign    Worried About Running Out of Food in the Last Year: Never true    Ran Out of Food in the Last Year: Never true  Transportation Needs: No Transportation Needs (08/20/2022)   PRAPARE - Administrator, Civil ServiceTransportation    Lack of Transportation (Medical): No    Lack of Transportation (Non-Medical): No  Physical Activity: Insufficiently Active (08/20/2022)   Exercise Vital Sign    Days of Exercise per Week: 5 days    Minutes of Exercise per Session: 20 min  Stress: No Stress Concern Present (08/20/2022)   [employer] - Occupational Stress Questionnaire    Feeling of Stress : Not at all  Social Connections: Moderately Integrated (08/20/2022)   Social Connection and Isolation Panel [NHANES]    Frequency of Communication with Friends and Family: More than three times a week    Frequency of Social Gatherings with Friends and Family: More than three times a week    Attends Religious Services: More than 4 times per year    Active Member of [employer] or Organizations: Yes    Attends BankerClub or Organization Meetings: More than 4 times per year    Marital Status: Widowed    Tobacco Counseling Counseling given: Not Answered   Clinical Intake:  Pre-visit preparation completed: No  Pain : No/denies pain     BMI - recorded: 25.69 Nutritional Status: BMI 25 -29 Overweight Nutritional Risks: None Diabetes:  No  How often do you need to have someone help you when you read instructions, pamphlets, or other written materials from your doctor or pharmacy?: 1 - Never  Diabetic?  No  Interpreter Needed?: No  Information entered by :: Theresa MulliganBeverly Monta Police LPN   Activities of Daily Living    08/20/2022    8:55 AM  In your present state of health, do you have any difficulty performing the following activities:  Hearing? 1  Comment Wears hearing aids  Vision? 0  Difficulty concentrating or making decisions? 0  Walking or climbing stairs? 0  Dressing or bathing? 0  Doing errands, shopping? 0  Preparing Food and eating ? N  Using the Toilet? N  In the past six months, have you accidently leaked urine? Y  Comment Wears breif prn. Followed by PCP  Do you have problems with loss of bowel control? N  Managing your Medications? N  Managing your Finances? N  Housekeeping or managing your Housekeeping? N    Patient Care Team: Kristian CoveyBurchette, Bruce W, MD as PCP - General Genia DelLavoie, Marie-Lyne, MD as Consulting Physician (Obstetrics and Gynecology) Mateo FlowHecker, Kathryn, MD as Consulting Physician (Ophthalmology) Sheran Luzamos, Richard, MD as Consulting Physician (Physical Medicine and Rehabilitation) Janalyn Harderafeen, Stuart, MD (Inactive) as Consulting Physician (Dermatology)  Indicate any recent Medical Services you may have received from other than [facility] providers in the past year (date may be approximate).     Assessment:   This is a routine wellness examination for Cassidy Norton.  Hearing/Vision screen Hearing Screening - Comments:: Wears hearing aids Vision Screening - Comments:: Wears rx glasses - up to date with routine eye exams with  Dr Elmer PickerHecker  Dietary issues and exercise activities discussed: Exercise limited by: None identified   Goals Addressed               This  Visit's Progress     Increase physical activity (pt-stated)        Would like lose weight. Increase walking and work in yard.       Depression  Screen    08/20/2022    8:55 AM 08/20/2022    8:54 AM 03/14/2022    2:33 PM 03/06/2022   10:40 AM 07/04/2021    1:57 PM 07/07/2019    9:11 AM 02/12/2018    8:58 AM  PHQ 2/9 Scores  PHQ - 2 Score 0 0 0 0 0 0 0  PHQ- 9 Score   1        Fall Risk    08/20/2022    8:57 AM 07/13/2022    3:12 PM 03/14/2022    2:33 PM 03/06/2022   12:01 PM 07/04/2021    2:00 PM  Fall Risk   Falls in the past year? 1 1 0 0 0  Number falls in past yr: 0 0 0 0 0  Injury with Fall? 0 1 0 0 0  Comment Followed by medical attention. No injury      Risk for fall due to : No Fall Risks Other (Comment) No Fall Risks No Fall Risks No Fall Risks  Follow up Falls prevention discussed Falls evaluation completed Falls evaluation completed Falls evaluation completed     FALL RISK PREVENTION PERTAINING TO THE HOME:  Any stairs in or around the home? Yes  If so, are there any without handrails? No  Home free of loose throw rugs in walkways, pet beds, electrical cords, etc? Yes  Adequate lighting in your home to reduce risk of falls? Yes   ASSISTIVE DEVICES UTILIZED TO PREVENT FALLS:  Life alert? No  Use of a cane, walker or w/c? No  Grab bars in the bathroom? Yes Shower chair or bench in shower? No  Elevated toilet seat or a handicapped toilet? Yes  TIMED UP AND GO:  Was the test performed? No . Audio Visit  Cognitive Function:        08/20/2022    8:59 AM 07/04/2021    2:05 PM  6CIT Screen  What Year? 0 points 0 points  What month? 0 points 0 points  What time? 0 points 0 points  Count back from 20 0 points 0 points  Months in reverse 0 points   Repeat phrase 0 points 0 points  Total Score 0 points     Immunizations Immunization History  Administered Date(s) Administered   Fluad Quad(high Dose 65+) 02/03/2019, 02/04/2020, 02/20/2021, 03/06/2022   Influenza Split 03/12/2012   Influenza, High Dose Seasonal PF 04/12/2015, 02/10/2016, 02/08/2017, 02/12/2018   Influenza,inj,Quad PF,6+ Mos 02/03/2013,  02/03/2014   Influenza,inj,quad, With Preservative 02/12/2019, 02/12/2020   MODERNA COVID-19 SARS-COV-2 PEDS BIVALENT BOOSTER 6Y-11Y 09/10/2019   Pneumococcal Conjugate-13 09/21/2013   Pneumococcal Polysaccharide-23 04/12/2015   Tdap 07/24/2012, 11/29/2021    TDAP status: Up to date  Flu Vaccine status: Up to date  Pneumococcal vaccine status: Up to date  Covid-19 vaccine status: Completed vaccines  Qualifies for Shingles Vaccine? Yes   Zostavax completed No   Shingrix Completed?: No.    Education has been provided regarding the importance of this vaccine. Patient has been advised to call insurance company to determine out of pocket expense if they have not yet received this vaccine. Advised may also receive vaccine at local pharmacy or Health Dept. Verbalized acceptance and understanding.  Screening Tests Health Maintenance  Topic Date Due   COVID-19 Vaccine (2 -  Moderna risk series) 09/05/2022 (Originally 10/08/2019)   Zoster Vaccines- Shingrix (1 of 2) 11/19/2022 (Originally 08/11/1964)   MAMMOGRAM  11/16/2022   INFLUENZA VACCINE  12/13/2022   Medicare Annual Wellness (AWV)  08/20/2023   DTaP/Tdap/Td (3 - Td or Tdap) 11/30/2031   Pneumonia Vaccine 50+ Years old  Completed   DEXA SCAN  Completed   Hepatitis C Screening  Completed   HPV VACCINES  Aged Out   COLONOSCOPY (Pts 45-11yrs Insurance coverage will need to be confirmed)  Discontinued    Health Maintenance  There are no preventive care reminders to display for this patient.     Mammogram status: Completed 11/15/21. Repeat every year  Bone Density status: Completed 09/21/14. Results reflect: Bone density results: OSTEOPOROSIS. Repeat every   years.  Lung Cancer Screening: (Low Dose CT Chest recommended if Age 79-80 years, 30 pack-year currently smoking OR have quit w/in 15years.) does not qualify.     Additional Screening:  Hepatitis C Screening: does qualify; Completed 02/12/18  Vision Screening: Recommended  annual ophthalmology exams for early detection of glaucoma and other disorders of the eye. Is the patient up to date with their annual eye exam?  Yes  Who is the provider or what is the name of the office in which the patient attends annual eye exams? Dr Elmer Picker If pt is not established with a provider, would they like to be referred to a provider to establish care? No .   Dental Screening: Recommended annual dental exams for proper oral hygiene  Community Resource Referral / Chronic Care Management:  CRR required this visit?  No   CCM required this visit?  No      Plan:     I have personally reviewed and noted the following in the patient's chart:   Medical and social history Use of alcohol, tobacco or illicit drugs  Current medications and supplements including opioid prescriptions. Patient is not currently taking opioid prescriptions. Functional ability and status Nutritional status Physical activity Advanced directives List of other physicians Hospitalizations, surgeries, and ER visits in previous 12 months Vitals Screenings to include cognitive, depression, and falls Referrals and appointments  In addition, I have reviewed and discussed with patient certain preventive protocols, quality metrics, and best practice recommendations. A written personalized care plan for preventive services as well as general preventive health recommendations were provided to patient.     Tillie Rung, LPN   06/18/4268   Nurse Notes:  None

## 2022-09-05 ENCOUNTER — Other Ambulatory Visit: Payer: Self-pay | Admitting: Family Medicine

## 2022-09-05 ENCOUNTER — Telehealth: Payer: Self-pay | Admitting: Internal Medicine

## 2022-09-05 ENCOUNTER — Telehealth: Payer: Self-pay | Admitting: *Deleted

## 2022-09-05 ENCOUNTER — Other Ambulatory Visit (INDEPENDENT_AMBULATORY_CARE_PROVIDER_SITE_OTHER): Payer: Medicare Other | Admitting: Podiatry

## 2022-09-05 DIAGNOSIS — J302 Other seasonal allergic rhinitis: Secondary | ICD-10-CM

## 2022-09-05 MED ORDER — SUCRALFATE 1 G PO TABS: 1.0000 g | ORAL_TABLET | Freq: Three times a day (TID) | ORAL | 0 refills | Status: DC

## 2022-09-05 MED ORDER — PREGABALIN 150 MG PO CAPS: 150.0000 mg | ORAL_CAPSULE | Freq: Two times a day (BID) | ORAL | 3 refills | Status: DC

## 2022-09-05 NOTE — Progress Notes (Signed)
Refill of lyrica sent per pt request

## 2022-09-05 NOTE — Telephone Encounter (Signed)
[facility]

## 2022-09-05 NOTE — Telephone Encounter (Signed)
Patient is calling for a refill of pregablin-150 mg, [facility].Please advise.

## 2022-09-05 NOTE — Telephone Encounter (Signed)
Patient updated.

## 2022-09-05 NOTE — Telephone Encounter (Signed)
Prescription Request  09/05/2022  LOV: 05/08/2022  What is the name of the medication or equipment?  rOPINIRole (REQUIP) 0.25 MG tablet fexofenadine (ALLEGRA ALLERGY) 60 MG tablet  Have you contacted your pharmacy to request a refill? No   Which pharmacy would you like this sent to?   [facility] - [place], [place] - [address] [place] [address] Phone: [phone] Fax: [phone]    Patient notified that their request is being sent to the clinical staff for review and that they should receive a response within 3 business days.   Please advise at Mobile [phone] (mobile)

## 2022-09-21 MED ORDER — ROPINIROLE HCL 0.25 MG PO TABS: ORAL_TABLET | ORAL | 0 refills | Status: DC

## 2022-09-21 NOTE — Telephone Encounter (Signed)
I am looking back on this and I don't believe I routed this back for some reason. I do apologize if I didn't. Not sure if this med was filled for her.   Please advise.

## 2022-09-23 MED ORDER — FEXOFENADINE HCL 60 MG PO TABS: 60.0000 mg | ORAL_TABLET | Freq: Every day | ORAL | 0 refills | Status: DC

## 2022-10-10 ENCOUNTER — Other Ambulatory Visit: Payer: Self-pay | Admitting: Physician Assistant

## 2022-10-14 ENCOUNTER — Other Ambulatory Visit: Payer: Self-pay | Admitting: Family Medicine

## 2022-10-23 ENCOUNTER — Encounter: Payer: Self-pay | Admitting: Internal Medicine

## 2022-10-24 DIAGNOSIS — N3281 Overactive bladder: Secondary | ICD-10-CM | POA: Diagnosis not present

## 2022-10-25 ENCOUNTER — Other Ambulatory Visit: Payer: Self-pay | Admitting: Family

## 2022-10-25 DIAGNOSIS — J302 Other seasonal allergic rhinitis: Secondary | ICD-10-CM

## 2022-11-07 DIAGNOSIS — M7542 Impingement syndrome of left shoulder: Secondary | ICD-10-CM | POA: Diagnosis not present

## 2022-11-12 DIAGNOSIS — L578 Other skin changes due to chronic exposure to nonionizing radiation: Secondary | ICD-10-CM | POA: Diagnosis not present

## 2022-11-12 DIAGNOSIS — L570 Actinic keratosis: Secondary | ICD-10-CM | POA: Diagnosis not present

## 2022-11-12 DIAGNOSIS — L821 Other seborrheic keratosis: Secondary | ICD-10-CM | POA: Diagnosis not present

## 2022-11-19 DIAGNOSIS — Z1231 Encounter for screening mammogram for malignant neoplasm of breast: Secondary | ICD-10-CM | POA: Diagnosis not present

## 2022-11-19 LAB — HM MAMMOGRAPHY

## 2022-11-20 ENCOUNTER — Encounter: Payer: Self-pay | Admitting: Family Medicine

## 2022-11-21 ENCOUNTER — Other Ambulatory Visit: Payer: Self-pay | Admitting: Internal Medicine

## 2022-11-22 ENCOUNTER — Other Ambulatory Visit: Payer: Self-pay | Admitting: Family Medicine

## 2022-11-22 DIAGNOSIS — J302 Other seasonal allergic rhinitis: Secondary | ICD-10-CM

## 2022-11-22 NOTE — Telephone Encounter (Signed)
Prescription Request  11/22/2022  LOV: 05/08/2022  What is the name of the medication or equipment? fexofenadine (ALLEGRA ALLERGY) 60 MG tablet   Have you contacted your pharmacy to request a refill? Yes   Which pharmacy would you like this sent to?   [facility] - [place], [place] - [address] Phone: [phone]  Fax: [phone]      Patient notified that their request is being sent to the clinical staff for review and that they should receive a response within 2 business days.   Please advise at [facility] [phone]

## 2022-11-23 MED ORDER — FEXOFENADINE HCL 60 MG PO TABS
60.0000 mg | ORAL_TABLET | Freq: Every day | ORAL | 5 refills | Status: DC
Start: 2022-11-23 — End: 2022-12-28

## 2022-12-05 ENCOUNTER — Encounter: Payer: Self-pay | Admitting: Family Medicine

## 2022-12-05 ENCOUNTER — Ambulatory Visit (INDEPENDENT_AMBULATORY_CARE_PROVIDER_SITE_OTHER): Payer: Medicare Other | Admitting: Family Medicine

## 2022-12-05 ENCOUNTER — Other Ambulatory Visit: Payer: Self-pay | Admitting: Family Medicine

## 2022-12-05 VITALS — BP 134/72 | HR 65 | Temp 97.9°F | Wt 146.2 lb

## 2022-12-05 DIAGNOSIS — J029 Acute pharyngitis, unspecified: Secondary | ICD-10-CM | POA: Diagnosis not present

## 2022-12-05 DIAGNOSIS — J0110 Acute frontal sinusitis, unspecified: Secondary | ICD-10-CM

## 2022-12-05 LAB — POC COVID19 BINAXNOW: SARS Coronavirus 2 Ag: NEGATIVE

## 2022-12-05 LAB — POCT RAPID STREP A (OFFICE): Rapid Strep A Screen: NEGATIVE

## 2022-12-05 MED ORDER — AMOXICILLIN-POT CLAVULANATE 875-125 MG PO TABS: 1.0000 | ORAL_TABLET | Freq: Two times a day (BID) | ORAL | 0 refills | Status: DC

## 2022-12-05 NOTE — Patient Instructions (Signed)
No follow-ups on file.        Great to see you today.    

## 2022-12-05 NOTE — Progress Notes (Signed)
Cassidy Norton , 12-21-1945, 77 y.o., female MRN: [id number] Patient Care Team    Relationship Specialty Notifications Start End  Kristian Covey, MD PCP - General   04/26/10    Comment: Kriste Basque, MD Consulting Physician Obstetrics and Gynecology  07/07/19   Mateo Flow, MD Consulting Physician Ophthalmology  07/07/19   Sheran Luz, MD Consulting Physician Physical Medicine and Rehabilitation  07/07/19   Janalyn Harder, MD (Inactive) Consulting Physician Dermatology  10/03/20     Chief Complaint  Patient presents with   Sore Throat    Started yesterday; c/o sore throat and headache     Subjective: Cassidy Norton is a 77 y.o. Pt presents for an OV with complaints of sore throat of 1 day duration.  Associated symptoms include headache. She reports she just feels tired. She has a friend staying with her, that had been in hospital a few times recently. Her daughter also had COVID last week, but she states she was not around her during her illness. GFR normal.       08/20/2022    8:55 AM 08/20/2022    8:54 AM 03/14/2022    2:33 PM 03/06/2022   10:40 AM 07/04/2021    1:57 PM  Depression screen PHQ 2/9  Decreased Interest 0 0 0 0 0  Down, Depressed, Hopeless 0 0 0 0 0  PHQ - 2 Score 0 0 0 0 0  Altered sleeping   1    Tired, decreased energy   0    Change in appetite   0    Feeling bad or failure about yourself    0    Trouble concentrating   0    Moving slowly or fidgety/restless   0    Suicidal thoughts   0    PHQ-9 Score   1    Difficult doing work/chores   Not difficult at all      Allergies  Allergen Reactions   Aspirin     REACTION: Hives   Social History   Social History Narrative   Lives alone, husband passed away 01/01/2021   R handed   Caffeine: 2 C of coffee in the AM   Past Medical History:  Diagnosis Date   Abdominal pain, epigastric 08/22/2009   Adenomatous colon polyp    Chronic low back pain    Diverticulosis    Duodenal  ulcer    GERD (gastroesophageal reflux disease)    Hemorrhoids    Hiatal hernia    HYPOTHYROIDISM 07/20/2008   Insomnia    Keratosis 09/11/2020   Right Hand   NSVD (normal spontaneous vaginal delivery)    X2   Past Surgical History:  Procedure Laterality Date   ABDOMINAL HYSTERECTOMY     TVH   BACK SURGERY     RUPTURED DISC   BLEPHAROPLASTY     CHOLECYSTECTOMY     COLONOSCOPY  JAN 2014   NEXT COLONOSCOPY IN 10 YEARS   DILATION AND CURETTAGE OF UTERUS     EYE SURGERY  02-Jan-2007   BILATERAL CATARACT    SHOULDER SURGERY Right 05/20/2020   [facility]   Family History  Problem Relation Age of Onset   Cancer Mother        bladder CA   Diabetes Mother    Cancer Father        gallblader CA   Heart disease Father    Diabetes Sister  Hypertension Sister    Diabetes Brother    Hypertension Brother    Heart disease Brother    Heart disease Daughter    Stomach cancer Neg Hx    Colon cancer Neg Hx    Allergies as of 12/05/2022       Reactions   Aspirin    REACTION: Hives        Medication List        Accurate as of December 05, 2022 11:49 AM. If you have any questions, ask your nurse or doctor.          STOP taking these medications    benzonatate 100 MG capsule Commonly known as: TESSALON Stopped by: Felix Pacini   mupirocin ointment 2 % Commonly known as: BACTROBAN Stopped by: Felix Pacini       TAKE these medications    cyanocobalamin 1000 MCG tablet Commonly known as: VITAMIN B12 Take 1,000 mcg by mouth daily.   cycloSPORINE 0.05 % ophthalmic emulsion Commonly known as: RESTASIS Place 1 drop into the left eye 2 (two) times daily.   estradiol 0.5 MG tablet Commonly known as: ESTRACE TAKE ONE-HALF (1/2) TABLET EVERY OTHER DAY   fexofenadine 60 MG tablet Commonly known as: Allegra Allergy Take 1 tablet (60 mg total) by mouth daily. For allergy symptoms.   fluticasone 50 MCG/ACT nasal spray Commonly known as: FLONASE Use 1  spray each nostril twice a day for 7 days then decrease to 1 spray each nostril once a day for 7 days.  Can repeat as needed for nasal congestion.   Gemtesa 75 MG Tabs Generic drug: Vibegron Take 1 tablet by mouth daily.   HYDROcodone-acetaminophen 10-325 MG tablet Commonly known as: NORCO Take 0.5 tablets by mouth every 6 (six) hours as needed.   levothyroxine 112 MCG tablet Commonly known as: SYNTHROID Take 1 tablet (112 mcg total) by mouth daily.   omeprazole 40 MG capsule Commonly known as: PRILOSEC TAKE 1 CAPSULE TWICE A DAY   polyethylene glycol 17 g packet Commonly known as: MIRALAX / GLYCOLAX Take 17 g by mouth daily as needed.   pramipexole 0.125 MG tablet Commonly known as: MIRAPEX Take 0.125-0.25 mg by mouth at bedtime as needed.   pregabalin 150 MG capsule Commonly known as: Lyrica Take 1 capsule (150 mg total) by mouth 2 (two) times daily.   PRESERVISION AREDS 2 PO Take 2 tablets by mouth daily in the afternoon.   Psyllium 30.9 % Powd Take by mouth. One tsp daily at bedtime   rOPINIRole 0.25 MG tablet Commonly known as: REQUIP TAKE 1 TABLET BY MOUTH AT NIGHT FOR 2 DAYS THEN INCREASE TO 0.5 MG OR 2 TABLETS BY MOUTH AT NIGHT   sucralfate 1 g tablet Commonly known as: CARAFATE Take 1 tablet (1 g total) by mouth 3 (three) times daily between meals. Office visit for further refills   SYSTANE COMPLETE OP Apply to eye at bedtime.        All past medical history, surgical history, allergies, family history, immunizations andmedications were updated in the EMR today and reviewed under the history and medication portions of their EMR.     Review of Systems  Constitutional:  Positive for malaise/fatigue. Negative for chills and fever.  HENT:  Positive for sore throat. Negative for congestion, ear pain and sinus pain.   Eyes:  Negative for pain, discharge and redness.  Respiratory:  Negative for cough, sputum production, shortness of breath and wheezing.    Neurological:  Positive for  headaches. Negative for dizziness.   Negative, with the exception of above mentioned in HPI   Objective:  BP 134/72   Pulse 65   Temp 97.9 F (36.6 C)   Wt 146 lb 3.2 oz (66.3 kg)   SpO2 94%   BMI 25.90 kg/m  Body mass index is 25.9 kg/m. Physical Exam Vitals and nursing note reviewed.  Constitutional:      General: She is not in acute distress.    Appearance: Normal appearance. She is not ill-appearing, toxic-appearing or diaphoretic.  HENT:     Head: Normocephalic and atraumatic.     Comments: Hearing aid  present bilaterally    Nose: No congestion or rhinorrhea.     Right Sinus: Frontal sinus tenderness present. No maxillary sinus tenderness.     Left Sinus: Frontal sinus tenderness present. No maxillary sinus tenderness.     Mouth/Throat:     Mouth: Mucous membranes are moist.     Pharynx: No oropharyngeal exudate or posterior oropharyngeal erythema.     Comments: Mild postnasal drip present Eyes:     General: No scleral icterus.       Right eye: No discharge.        Left eye: No discharge.     Extraocular Movements: Extraocular movements intact.     Conjunctiva/sclera: Conjunctivae normal.     Pupils: Pupils are equal, round, and reactive to light.  Cardiovascular:     Rate and Rhythm: Normal rate and regular rhythm.  Pulmonary:     Effort: Pulmonary effort is normal. No respiratory distress.     Breath sounds: Normal breath sounds. No wheezing, rhonchi or rales.  Musculoskeletal:     Cervical back: Neck supple. No tenderness.     Right lower leg: No edema.     Left lower leg: No edema.  Lymphadenopathy:     Cervical: No cervical adenopathy.  Skin:    General: Skin is warm.     Findings: No rash.  Neurological:     Mental Status: She is alert and oriented to person, place, and time. Mental status is at baseline.     Motor: No weakness.     Gait: Gait normal.  Psychiatric:        Mood and Affect: Mood normal.        Behavior:  Behavior normal.        Thought Content: Thought content normal.        Judgment: Judgment normal.    No results found. No results found. Results for orders placed or performed in visit on 12/05/22 (from the past 24 hour(s))  POC COVID-19 BinaxNow     Status: None   Collection Time: 12/05/22 11:48 AM  Result Value Ref Range   SARS Coronavirus 2 Ag Negative Negative  POCT rapid strep A     Status: None   Collection Time: 12/05/22 11:48 AM  Result Value Ref Range   Rapid Strep A Screen Negative Negative    Assessment/Plan: CIMBERLY STOFFEL is a 77 y.o. female present for OV for  Sore throat/sinusitis-frontal COVID and rapid strep are negative today.  Respiratory culture is sent.  We discussed this is likely viral in nature and antibiotics would not be helpful at this time. - POC COVID-19 BinaxNow - POCT rapid strep A - Upper Respiratory Culture Rest, hydrate.  Start mucinex-patient states she has this at home. Augmentin printed -prescribed, to take only if culture returns positive or symptoms worsen.  Patient reports understanding F/U 2  weeks if not improved.   Reviewed expectations re: course of current medical issues. Discussed self-management of symptoms. Outlined signs and symptoms indicating need for more acute intervention. Patient verbalized understanding and all questions were answered. Patient received an After-Visit Summary.    Orders Placed This Encounter  Procedures   Upper Respiratory Culture   POC COVID-19 BinaxNow   POCT rapid strep A   No orders of the defined types were placed in this encounter.  Referral Orders  No referral(s) requested today     Note is dictated utilizing voice recognition software. Although note has been proof read prior to signing, occasional typographical errors still can be missed. If any questions arise, please do not hesitate to call for verification.   electronically signed by:  Felix Pacini, DO  [facility] -  OR

## 2022-12-10 ENCOUNTER — Emergency Department (HOSPITAL_COMMUNITY): Payer: Medicare Other

## 2022-12-10 ENCOUNTER — Emergency Department (HOSPITAL_COMMUNITY)
Admission: EM | Admit: 2022-12-10 | Discharge: 2022-12-11 | Disposition: A | Discharge status: Home / Self Care | Payer: Medicare Other | Attending: Emergency Medicine | Admitting: Emergency Medicine

## 2022-12-10 ENCOUNTER — Telehealth: Payer: Self-pay | Admitting: Family Medicine

## 2022-12-10 ENCOUNTER — Encounter (HOSPITAL_COMMUNITY): Payer: Self-pay | Admitting: Emergency Medicine

## 2022-12-10 ENCOUNTER — Other Ambulatory Visit: Payer: Self-pay

## 2022-12-10 DIAGNOSIS — J9811 Atelectasis: Secondary | ICD-10-CM | POA: Diagnosis not present

## 2022-12-10 DIAGNOSIS — I700 Atherosclerosis of aorta: Secondary | ICD-10-CM | POA: Diagnosis not present

## 2022-12-10 DIAGNOSIS — R079 Chest pain, unspecified: Secondary | ICD-10-CM | POA: Insufficient documentation

## 2022-12-10 DIAGNOSIS — I10 Essential (primary) hypertension: Secondary | ICD-10-CM | POA: Diagnosis not present

## 2022-12-10 DIAGNOSIS — R0789 Other chest pain: Secondary | ICD-10-CM | POA: Diagnosis not present

## 2022-12-10 LAB — CBC
HCT: 39.5 % (ref 36.0–46.0)
Hemoglobin: 13.6 g/dL (ref 12.0–15.0)
MCH: 32.4 pg (ref 26.0–34.0)
MCHC: 34.4 g/dL (ref 30.0–36.0)
MCV: 94 fL (ref 80.0–100.0)
Platelets: 184 10*3/uL (ref 150–400)
RBC: 4.2 MIL/uL (ref 3.87–5.11)
RDW: 13.2 % (ref 11.5–15.5)
WBC: 7.3 10*3/uL (ref 4.0–10.5)
nRBC: 0 % (ref 0.0–0.2)

## 2022-12-10 LAB — COMPREHENSIVE METABOLIC PANEL
ALT: 17 U/L (ref 0–44)
AST: 18 U/L (ref 15–41)
Albumin: 3.5 g/dL (ref 3.5–5.0)
Alkaline Phosphatase: 72 U/L (ref 38–126)
Anion gap: 14 (ref 5–15)
BUN: 9 mg/dL (ref 8–23)
CO2: 24 mmol/L (ref 22–32)
Calcium: 9.1 mg/dL (ref 8.9–10.3)
Chloride: 103 mmol/L (ref 98–111)
Creatinine, Ser: 0.71 mg/dL (ref 0.44–1.00)
GFR, Estimated: >60 mL/min (ref >60)
Glucose, Bld: 121 mg/dL — ABNORMAL HIGH (ref 70–99)
Potassium: 3.6 mmol/L (ref 3.5–5.1)
Sodium: 141 mmol/L (ref 135–145)
Total Bilirubin: 0.5 mg/dL (ref 0.3–1.2)
Total Protein: 6.2 g/dL — ABNORMAL LOW (ref 6.5–8.1)

## 2022-12-10 LAB — LIPASE, BLOOD: Lipase: 39 U/L (ref 11–51)

## 2022-12-10 LAB — TROPONIN I (HIGH SENSITIVITY): Troponin I (High Sensitivity): 6 ng/L (ref <18)

## 2022-12-10 NOTE — Telephone Encounter (Signed)
Please inform patient her throat culture returned with group C positive.  This is treated the same way as the other strep infections.  Medication prescribed to her should treat her infection appropriately.

## 2022-12-10 NOTE — ED Triage Notes (Signed)
Pt BIB [facility] with c/o left sided chest pain with radiation to left arm starting at 2000, 8/10 pressure, denies cardiac hx, pt dx with strep throat today, v/s en route 136/76, HR 80, RR16, 94% RA, CBG 118, EKG WNL

## 2022-12-10 NOTE — ED Notes (Signed)
EDP at bedside for MSE.  

## 2022-12-10 NOTE — Telephone Encounter (Signed)
Spoke with patient regarding results/recommendations.  

## 2022-12-10 NOTE — ED Provider Triage Note (Signed)
Emergency Medicine Provider Triage Evaluation Note  Cassidy Norton , a 77 y.o. female  was evaluated in triage.  Pt complains of chest pain abdominal pain that began at 8 PM tonight.  Patient denies cardiac history but states that last week she was treated for strep throat.  Patient states that her strep throat test came back positive today and that she still has a mildly sore throat.  Patient denies any fevers but states that her chest pain is substernal and radiates to her left arm.  Patient also endorses epigastric abdominal pain.  Patient is unsure what makes these pains worse or better.  Patient denies nausea/vomiting, change in sensation/motor skills, fevers, vision changes, headache  Review of Systems  Positive: See HPI Negative: See HPI  Physical Exam  Ht 5\' 3"  (1.6 m)   Wt 66.2 kg   BMI 25.86 kg/m  Gen:   Awake, no distress   Resp:  Normal effort  MSK:   Moves extremities without difficulty  Other:  No abdominal tenderness, no peritoneal signs noted, no murmurs rubs or gallops noted, lungs clear to auscultation bilaterally, oropharynx was erythematous without other abnormalities  Medical Decision Making  Medically screening exam initiated at 9:54 PM.  Appropriate orders placed.  Cassidy Norton was informed that the remainder of the evaluation will be completed by another provider, this initial triage assessment does not replace that evaluation, and the importance of remaining in the ED until their evaluation is complete.  Workup initiated, patient stable at this time   Cassidy Norton 12/10/22 2155

## 2022-12-11 DIAGNOSIS — R079 Chest pain, unspecified: Secondary | ICD-10-CM | POA: Diagnosis not present

## 2022-12-11 NOTE — ED Provider Notes (Signed)
[facility] DEPARTMENT AT [facility] Provider Note   CSN: [id number] Arrival date & time: 12/10/22  2146     History  Chief Complaint  Patient presents with   Chest Pain    Cassidy Norton is a 77 y.o. female.  Patient presents to the emergency department for evaluation of chest pain.  Patient reports that she had onset of discomfort across her upper abdomen that radiated up into the left chest and armpit area.  This began around 8 PM.  Patient thought it might be indigestion, took 2 Tums but it did not help.  She tried walking around.  This did not help but it also did not worsen the pain at all.  No associated shortness of breath.  Patient now reports that her symptoms have resolved.       Home Medications Prior to Admission medications   Medication Sig Start Date End Date Taking? Authorizing Provider  amoxicillin-clavulanate (AUGMENTIN) 875-125 MG tablet Take 1 tablet by mouth 2 (two) times daily. 12/05/22   Kuneff, Renee A, DO  cycloSPORINE (RESTASIS) 0.05 % ophthalmic emulsion Place 1 drop into the left eye 2 (two) times daily.    [provider]  estradiol (ESTRACE) 0.5 MG tablet TAKE ONE-HALF (1/2) TABLET EVERY OTHER DAY 05/12/20   Genia Del, MD  fexofenadine [facility] ALLERGY) 60 MG tablet Take 1 tablet (60 mg total) by mouth daily. For allergy symptoms. Patient not taking: Reported on 12/05/2022 11/23/22   Kristian Covey, MD  fluticasone [facility]) 50 MCG/ACT nasal spray Use 1 spray each nostril twice a day for 7 days then decrease to 1 spray each nostril once a day for 7 days.  Can repeat as needed for nasal congestion. 06/01/22   Hunsucker, Lesia Sago, MD  HYDROcodone-acetaminophen (NORCO) 10-325 MG per tablet Take 0.5 tablets by mouth every 6 (six) hours as needed. 04/29/14   [provider]  levothyroxine (SYNTHROID) 112 MCG tablet Take 1 tablet (112 mcg total) by mouth daily. 04/18/22   Burchette, Elberta Fortis, MD  Multiple  Vitamins-Minerals (PRESERVISION AREDS 2 PO) Take 2 tablets by mouth daily in the afternoon.    [provider]  omeprazole (PRILOSEC) 40 MG capsule TAKE 1 CAPSULE TWICE A DAY 10/10/22   Esterwood, Amy S, PA-C  polyethylene glycol (MIRALAX / GLYCOLAX) packet Take 17 g by mouth daily as needed.     [provider]  pramipexole (MIRAPEX) 0.125 MG tablet Take 0.125-0.25 mg by mouth at bedtime as needed. 05/12/22   [provider]  pregabalin (LYRICA) 150 MG capsule Take 1 capsule (150 mg total) by mouth 2 (two) times daily. 09/05/22   Standiford, Jenelle Mages, DPM  Propylene Glycol (SYSTANE COMPLETE OP) Apply to eye at bedtime.     [provider]  Psyllium 30.9 % POWD Take by mouth. One tsp daily at bedtime    [provider]  rOPINIRole (REQUIP) 0.25 MG tablet TAKE 1 TABLET AT NIGHT FOR 2 DAYS THEN INCREASE TO 2 TABLETS AT NIGHT 12/05/22   Burchette, Elberta Fortis, MD  sucralfate (CARAFATE) 1 g tablet Take 1 tablet (1 g total) by mouth 3 (three) times daily between meals. Office visit for further refills 09/05/22   Hilarie Fredrickson, MD  Vibegron (GEMTESA) 75 MG TABS Take 1 tablet by mouth daily.    [provider]  vitamin B-12 (CYANOCOBALAMIN) 1000 MCG tablet Take 1,000 mcg by mouth daily.    [provider]  Allergies    Aspirin    Review of Systems   Review of Systems  Physical Exam Updated Vital Signs BP 123/68 (BP Location: Left Arm)   Pulse 74   Temp 97.8 F (36.6 C)   Resp 18   Ht 5\' 3"  (1.6 m)   Wt 66.2 kg   SpO2 94%   BMI 25.86 kg/m  Physical Exam Vitals and nursing note reviewed.  Constitutional:      General: She is not in acute distress.    Appearance: She is well-developed.  HENT:     Head: Normocephalic and atraumatic.     Mouth/Throat:     Mouth: Mucous membranes are moist.  Eyes:     General: Vision grossly intact. Gaze aligned appropriately.     Extraocular Movements: Extraocular movements intact.      Conjunctiva/sclera: Conjunctivae normal.  Cardiovascular:     Rate and Rhythm: Normal rate and regular rhythm.     Pulses: Normal pulses.     Heart sounds: Normal heart sounds, S1 normal and S2 normal. No murmur heard.    No friction rub. No gallop.  Pulmonary:     Effort: Pulmonary effort is normal. No respiratory distress.     Breath sounds: Normal breath sounds.  Abdominal:     General: Bowel sounds are normal.     Palpations: Abdomen is soft.     Tenderness: There is no abdominal tenderness. There is no guarding or rebound.     Hernia: No hernia is present.  Musculoskeletal:        General: No swelling.     Cervical back: Full passive range of motion without pain, normal range of motion and neck supple. No spinous process tenderness or muscular tenderness. Normal range of motion.     Right lower leg: No edema.     Left lower leg: No edema.  Skin:    General: Skin is warm and dry.     Capillary Refill: Capillary refill takes less than 2 seconds.     Findings: No ecchymosis, erythema, rash or wound.  Neurological:     General: No focal deficit present.     Mental Status: She is alert and oriented to person, place, and time.     GCS: GCS eye subscore is 4. GCS verbal subscore is 5. GCS motor subscore is 6.     Cranial Nerves: Cranial nerves 2-12 are intact.     Sensory: Sensation is intact.     Motor: Motor function is intact.     Coordination: Coordination is intact.  Psychiatric:        Attention and Perception: Attention normal.        Mood and Affect: Mood normal.        Speech: Speech normal.        Behavior: Behavior normal.     ED Results / Procedures / Treatments   Labs (all labs ordered are listed, but only abnormal results are displayed) Labs Reviewed  COMPREHENSIVE METABOLIC PANEL - Abnormal; Notable for the following components:      Result Value   Glucose, Bld 121 (*)    Total Protein 6.2 (*)    All other components within normal limits  CBC  LIPASE,  BLOOD  TROPONIN I (HIGH SENSITIVITY)  TROPONIN I (HIGH SENSITIVITY)    EKG EKG Interpretation Date/Time:  Monday December 10 2022 22:01:41 EDT Ventricular Rate:  77 PR Interval:  178 QRS Duration:  84 QT Interval:  384 QTC Calculation: 434  R Axis:   183  Text Interpretation: ** Suspect arm lead reversal, interpretation assumes no reversal Normal sinus rhythm Right superior axis deviation Inferior infarct , age undetermined Abnormal ECG When compared with ECG of 17-Mar-2005 05:13, No significant change was found Confirmed by Gilda Crease [phone]) on 12/11/2022 4:24:43 AM  Radiology DG Chest Port 1 View  Result Date: 12/10/2022 CLINICAL DATA:  CP EXAM: PORTABLE CHEST 1 VIEW COMPARISON:  Chest x-ray 05/08/2022 FINDINGS: The heart and mediastinal contours are unchanged. Aortic calcification. Right base atelectasis. No focal consolidation. No pulmonary edema. No pleural effusion. No pneumothorax. No acute osseous abnormality. IMPRESSION: 1. No active disease. 2.  Aortic Atherosclerosis (ICD10-I70.0). Electronically Signed   By: Tish Frederickson M.D.   On: 12/10/2022 22:34    Procedures Procedures    Medications Ordered in ED Medications - No data to display  ED Course/ Medical Decision Making/ A&P                             Medical Decision Making Amount and/or Complexity of Data Reviewed External Data Reviewed: ECG. Labs: ordered. Decision-making details documented in ED Course. ECG/medicine tests: ordered and independent interpretation performed. Decision-making details documented in ED Course.   Differential Diagnosis considered includes, but not limited to: STEMI; NSTEMI; myocarditis; pericarditis; pulmonary embolism; aortic dissection; pneumothorax; pneumonia; gastritis; musculoskeletal pain  Patient with chest pain that was present for several hours and then resolved.  Pain was a heaviness and pressure over the upper abdomen, lower chest radiating into the left chest  and towards the left arm.  No associated shortness of breath.  No nausea, vomiting, diaphoresis.  EKG unchanged from prior.  Troponin negative x 2.  Patient without any known CAD.  Symptoms somewhat atypical, were not worsened by exertion.  Pain has resolved without intervention.  She will be appropriate for outpatient follow-up.  Will refer to cardiology.        Final Clinical Impression(s) / ED Diagnoses Final diagnoses:  Chest pain, unspecified type    Rx / DC Orders ED Discharge Orders          Ordered    Ambulatory referral to Cardiology       Comments: If you have not heard from the Cardiology office within the next 72 hours please call [phone].   12/11/22 0444              Gilda Crease, MD 12/11/22 [phone]

## 2022-12-11 NOTE — ED Notes (Signed)
Discharge instructions discussed with pt. Verbalized understanding. VSS. No questions or concerns regarding discharge  

## 2022-12-18 ENCOUNTER — Telehealth: Payer: Self-pay

## 2022-12-18 NOTE — Telephone Encounter (Signed)
Transition Care Management Unsuccessful Follow-up Telephone Call  Date of discharge and from where:  12/11/2022 [facility]  Attempts:  1st Attempt  Reason for unsuccessful TCM follow-up call:  Left voice message  Brooks Stotz [facility]  [facility] Population Health Community Resource Care Guide   [email or web address]  ?? [phone]   Website: [email or web address]  [email or web address]

## 2022-12-19 ENCOUNTER — Telehealth: Payer: Self-pay

## 2022-12-19 NOTE — Telephone Encounter (Signed)
Transition Care Management Follow-up Telephone Call Date of discharge and from where: 12/11/2022 [facility] How have you been since you were released from the hospital? Patient stated she is feeling much better. Any questions or concerns? No  Items Reviewed: Did the pt receive and understand the discharge instructions provided? Yes  Medications obtained and verified?  No medication prescribed. Other? No  Any new allergies since your discharge? No  Dietary orders reviewed? Yes Do you have support at home? Yes   Follow up appointments reviewed:  PCP Hospital f/u appt confirmed? No  Scheduled to see Hilarie Fredrickson, MD on 01/01/2023 @ [facility]. Specialist Hospital f/u appt confirmed? Yes  Scheduled to see Theron Arista C. Eden Emms, MD on 03/19/2023 @ [facility]. Are transportation arrangements needed? No  If their condition worsens, is the pt aware to call PCP or go to the Emergency Dept.? Yes Was the patient provided with contact information for the PCP's office or ED? Yes Was to pt encouraged to call back with questions or concerns? Yes  Russ Looper [facility]  [facility] Population Health Community Resource Care Guide   [email or web address]  ?? [phone]   Website: [email or web address]  [email or web address]

## 2022-12-21 ENCOUNTER — Other Ambulatory Visit: Payer: Self-pay | Admitting: Family Medicine

## 2022-12-28 ENCOUNTER — Ambulatory Visit (INDEPENDENT_AMBULATORY_CARE_PROVIDER_SITE_OTHER): Payer: Medicare Other | Admitting: Family Medicine

## 2022-12-28 ENCOUNTER — Encounter: Payer: Self-pay | Admitting: Family Medicine

## 2022-12-28 VITALS — BP 124/58 | HR 80 | Temp 97.9°F | Wt 150.2 lb

## 2022-12-28 DIAGNOSIS — J302 Other seasonal allergic rhinitis: Secondary | ICD-10-CM

## 2022-12-28 DIAGNOSIS — L309 Dermatitis, unspecified: Secondary | ICD-10-CM | POA: Diagnosis not present

## 2022-12-28 DIAGNOSIS — I872 Venous insufficiency (chronic) (peripheral): Secondary | ICD-10-CM

## 2022-12-28 MED ORDER — TRIAMCINOLONE ACETONIDE 0.025 % EX OINT
1.0000 | TOPICAL_OINTMENT | Freq: Two times a day (BID) | CUTANEOUS | 0 refills | Status: DC
Start: 2022-12-28 — End: 2023-04-19

## 2022-12-28 MED ORDER — FEXOFENADINE HCL 60 MG PO TABS
60.0000 mg | ORAL_TABLET | Freq: Every day | ORAL | 1 refills | Status: DC
Start: 2022-12-28 — End: 2023-05-02

## 2022-12-28 NOTE — Progress Notes (Signed)
Established Patient Office Visit   Subjective  [name], female    DOB: Jul 10, 1945  Age: 77 y.o. MRN: [id number]  Chief Complaint  Patient presents with   red spot    Red spots on both lower legs, left leg itches. Noticed the spots about 6 months ago along with swelling, but the itching started about a month ago. Has not tried or taken anything for the itching.     Patient is a 77 year old female followed by Dr. Caryl Never and seen for acute concern.  Patient endorses ongoing reddish discoloration of bilateral lower legs and edema.  Patient denies pain.  States was told she would have issues with left lower extremity years ago after back surgery.  Patient notes LLE typically swells/is larger than right LE.  Right LE swells less frequently than left.  Patient does a combination of sitting and standing throughout the day.  Patient has a history of neuropathy though had testing which was negative.  Wanted to make sure the neuropathy was not causing symptoms and that she did not have poor circulation as several people have mentioned that to her.  Recently mowed the lawn and notes increased allergy symptoms including rhinorrhea.  Requesting refill on Allegra.    Past Medical History:  Diagnosis Date   Abdominal pain, epigastric 08/22/2009   Adenomatous colon polyp    Chronic low back pain    Diverticulosis    Duodenal ulcer    GERD (gastroesophageal reflux disease)    Hemorrhoids    Hiatal hernia    HYPOTHYROIDISM 07/20/2008   Insomnia    Keratosis 09/11/2020   Right Hand   NSVD (normal spontaneous vaginal delivery)    X2   Past Surgical History:  Procedure Laterality Date   ABDOMINAL HYSTERECTOMY     TVH   BACK SURGERY     RUPTURED DISC   BLEPHAROPLASTY     CHOLECYSTECTOMY     COLONOSCOPY  JAN 2014   NEXT COLONOSCOPY IN 10 YEARS   DILATION AND CURETTAGE OF UTERUS     EYE SURGERY  2008   BILATERAL CATARACT    SHOULDER SURGERY Right 05/20/2020   [facility] Dr Rennis Chris   Social History   Tobacco Use   Smoking status: Former    Current packs/day: 0.00    Average packs/day: 1.5 packs/day for 40.0 years (60.0 ttl pk-yrs)    Types: Cigarettes    Start date: 08/28/1963    Quit date: 08/28/2003    Years since quitting: 19.3   Smokeless tobacco: Never  Vaping Use   Vaping status: Never Used  Substance Use Topics   Alcohol use: No    Alcohol/week: 0.0 standard drinks of alcohol   Drug use: No   Family History  Problem Relation Age of Onset   Cancer Mother        bladder CA   Diabetes Mother    Cancer Father        gallblader CA   Heart disease Father    Diabetes Sister    Hypertension Sister    Diabetes Brother    Hypertension Brother    Heart disease Brother    Heart disease Daughter    Stomach cancer Neg Hx    Colon cancer Neg Hx    Allergies  Allergen Reactions   Aspirin     REACTION: Hives      ROS Negative unless stated above    Objective:     BP (!) 124/58 (  BP Location: Left Arm, Patient Position: Sitting, Cuff Size: Normal)   Pulse 80   Temp 97.9 F (36.6 C) (Oral)   Wt 150 lb 3.2 oz (68.1 kg)   SpO2 95%   BMI 26.61 kg/m    Physical Exam Constitutional:      General: She is not in acute distress.    Appearance: Normal appearance.  HENT:     Head: Normocephalic and atraumatic.     Nose: Nose normal.     Mouth/Throat:     Mouth: Mucous membranes are moist.  Cardiovascular:     Rate and Rhythm: Normal rate and regular rhythm.     Pulses:          Dorsalis pedis pulses are 3+ on the right side and 3+ on the left side.       Posterior tibial pulses are 3+ on the right side and 3+ on the left side.     Heart sounds: Normal heart sounds. No murmur heard.    No gallop.     Comments: Trace edema of right lower leg to mid shin.  Trace left pedal edema.  1+ pitting edema of left lower leg mid shin. Pulmonary:     Effort: Pulmonary effort is normal. No respiratory distress.     Breath sounds: Normal  breath sounds. No wheezing, rhonchi or rales.  Musculoskeletal:     Right lower leg: Edema present.     Left lower leg: 1+ Edema present.  Skin:    General: Skin is warm and dry.     Capillary Refill: Capillary refill takes less than 2 seconds.     Findings: Erythema and rash present. No abrasion, ecchymosis, signs of injury, laceration or wound.     Comments: Left foot and lateral left ankle with several 3-4 mm skin colored papules.  Bilateral LEs from ankle to mid shin with reddish discoloration and blotchy hyperpigmentation.  Left lower leg>R.  Spider veins of bilateral medial distal thigh.  Few varicose veins also noted.  Neurological:     Mental Status: She is alert and oriented to person, place, and time.      No results found for any visits on 12/28/22.    Assessment & Plan:  Venous insufficiency of both lower extremities  Dermatitis -     Triamcinolone Acetonide; Apply 1 Application topically 2 (two) times daily.  Dispense: 30 g; Refill: 0  Seasonal allergies -     Fexofenadine HCl; Take 1 tablet (60 mg total) by mouth daily. For allergy symptoms.  Dispense: 30 tablet; Refill: 1  Discoloration of bilateral LEs 2/2 venous insufficiency.  Patient advised to elevate LEs when sitting and wear compression socks.  If varicose veins and spider veins become painful consider referral to vascular surgery.  Given triamcinolone cream for pruritic papular rash on left lateral foot and ankle.  Allegra refilled.  Return if symptoms worsen or fail to improve.   Deeann Saint, MD

## 2023-01-01 ENCOUNTER — Ambulatory Visit: Payer: Medicare Other | Admitting: Internal Medicine

## 2023-01-08 ENCOUNTER — Other Ambulatory Visit: Payer: Self-pay | Admitting: Physician Assistant

## 2023-01-29 DIAGNOSIS — M7051 Other bursitis of knee, right knee: Secondary | ICD-10-CM | POA: Diagnosis not present

## 2023-01-29 DIAGNOSIS — M7061 Trochanteric bursitis, right hip: Secondary | ICD-10-CM | POA: Diagnosis not present

## 2023-01-29 DIAGNOSIS — M1711 Unilateral primary osteoarthritis, right knee: Secondary | ICD-10-CM | POA: Diagnosis not present

## 2023-01-30 ENCOUNTER — Encounter: Payer: Medicare Other | Admitting: Obstetrics and Gynecology

## 2023-01-31 ENCOUNTER — Telehealth: Payer: Self-pay | Admitting: Internal Medicine

## 2023-01-31 MED ORDER — OMEPRAZOLE 40 MG PO CPDR: 40.0000 mg | DELAYED_RELEASE_CAPSULE | Freq: Two times a day (BID) | ORAL | 0 refills | Status: DC

## 2023-01-31 NOTE — Telephone Encounter (Signed)
Inbound call from patient is requesting refill for omeprazole sent to [facility], Patient has appointment with Amy 11/1.

## 2023-01-31 NOTE — Telephone Encounter (Signed)
[facility]ep[facility]ole [facility]

## 2023-02-07 ENCOUNTER — Ambulatory Visit: Payer: Medicare Other

## 2023-02-07 DIAGNOSIS — Z23 Encounter for immunization: Secondary | ICD-10-CM | POA: Diagnosis not present

## 2023-02-25 ENCOUNTER — Other Ambulatory Visit: Payer: Self-pay | Admitting: Family Medicine

## 2023-02-26 ENCOUNTER — Other Ambulatory Visit: Payer: Self-pay

## 2023-02-26 MED ORDER — SUCRALFATE 1 G PO TABS: 1.0000 g | ORAL_TABLET | Freq: Three times a day (TID) | ORAL | 0 refills | Status: DC

## 2023-03-04 DIAGNOSIS — M51379 Other intervertebral disc degeneration, lumbosacral region without mention of lumbar back pain or lower extremity pain: Secondary | ICD-10-CM | POA: Diagnosis not present

## 2023-03-04 DIAGNOSIS — M79604 Pain in right leg: Secondary | ICD-10-CM | POA: Diagnosis not present

## 2023-03-04 DIAGNOSIS — Z79891 Long term (current) use of opiate analgesic: Secondary | ICD-10-CM | POA: Diagnosis not present

## 2023-03-07 DIAGNOSIS — H353131 Nonexudative age-related macular degeneration, bilateral, early dry stage: Secondary | ICD-10-CM | POA: Diagnosis not present

## 2023-03-07 DIAGNOSIS — H04123 Dry eye syndrome of bilateral lacrimal glands: Secondary | ICD-10-CM | POA: Diagnosis not present

## 2023-03-07 DIAGNOSIS — H02831 Dermatochalasis of right upper eyelid: Secondary | ICD-10-CM | POA: Diagnosis not present

## 2023-03-07 DIAGNOSIS — H02834 Dermatochalasis of left upper eyelid: Secondary | ICD-10-CM | POA: Diagnosis not present

## 2023-03-14 ENCOUNTER — Telehealth: Payer: Self-pay | Admitting: Physician Assistant

## 2023-03-14 MED ORDER — SUCRALFATE 1 G PO TABS: 1.0000 g | ORAL_TABLET | Freq: Three times a day (TID) | ORAL | 0 refills | Status: DC

## 2023-03-14 NOTE — Telephone Encounter (Signed)
PT needs a refill on her sucralfate sent to [facility]. Please advise.

## 2023-03-14 NOTE — Telephone Encounter (Signed)
[facility] with no refills until patient is seen in the clinic on 03-21-23.

## 2023-03-15 ENCOUNTER — Ambulatory Visit: Payer: Medicare Other | Admitting: Physician Assistant

## 2023-03-18 ENCOUNTER — Ambulatory Visit (INDEPENDENT_AMBULATORY_CARE_PROVIDER_SITE_OTHER): Payer: Medicare Other | Admitting: Obstetrics and Gynecology

## 2023-03-18 ENCOUNTER — Encounter: Payer: Self-pay | Admitting: Obstetrics and Gynecology

## 2023-03-18 VITALS — BP 112/74 | HR 77 | Temp 97.7°F

## 2023-03-18 DIAGNOSIS — N3001 Acute cystitis with hematuria: Secondary | ICD-10-CM

## 2023-03-18 DIAGNOSIS — E2839 Other primary ovarian failure: Secondary | ICD-10-CM

## 2023-03-18 DIAGNOSIS — R350 Frequency of micturition: Secondary | ICD-10-CM

## 2023-03-18 MED ORDER — FLUCONAZOLE 150 MG PO TABS: 150.0000 mg | ORAL_TABLET | Freq: Once | ORAL | 2 refills | Status: AC

## 2023-03-18 MED ORDER — NITROFURANTOIN MONOHYD MACRO 100 MG PO CAPS: 100.0000 mg | ORAL_CAPSULE | Freq: Two times a day (BID) | ORAL | 0 refills | Status: DC

## 2023-03-18 NOTE — Progress Notes (Addendum)
   Acute Office Visit  Subjective:    [name], female    DOB: 1946/01/26, 77 y.o.   MRN: [id number]   HPI 77 y.o. presents today for Urinary Tract Infection (Uti//jj/Pt c/o urinary frequency & lower pelvic pain, pressure) . No fevers or cva tenderness Does have notcuria that is bothersome and feels pelvic "heaviness" No dysuria Has her annual next week  No LMP recorded. Patient has had a hysterectomy.    Review of Systems     Objective:    OBGyn Exam  BP 112/74   Pulse 77   Temp 97.7 F (36.5 C) (Oral)   SpO2 98%  Wt Readings from Last 3 Encounters:  12/28/22 150 lb 3.2 oz (68.1 kg)  12/10/22 146 lb (66.2 kg)  12/05/22 146 lb 3.2 oz (66.3 kg)        Patient informed chaperone available to be present for breast and/or pelvic exam. Patient has requested no chaperone to be present. Patient has been advised what will be completed during breast and pelvic exam.   Assessment & Plan:  There are no diagnoses linked to this encounter.  UTI: macrobid sent in.  Counseled she may need a different antibiotic based on culture results and we will notify her. Diflucan to take after the macrobid to prevent a yeast infection with refills Referral to urogyn for recurrent UTI.  Her urologist retired. Ashby Dawes Kerah Hardebeck  15 minutes spent on reviewing records, imaging,  and one on one patient time and counseling patient and documentation Dr. Karma Greaser

## 2023-03-18 NOTE — Addendum Note (Signed)
Addended by: Eliezer Bottom on: 03/18/2023 03:30 PM   Modules accepted: Orders

## 2023-03-19 ENCOUNTER — Ambulatory Visit: Payer: Medicare Other | Admitting: Cardiovascular Disease

## 2023-03-21 ENCOUNTER — Ambulatory Visit: Payer: Medicare Other | Admitting: Physician Assistant

## 2023-03-21 ENCOUNTER — Telehealth: Payer: Self-pay | Admitting: Physician Assistant

## 2023-03-21 LAB — URINALYSIS, COMPLETE W/RFL CULTURE
Bilirubin Urine: NEGATIVE
Casts: NONE SEEN /LPF
Crystals: NONE SEEN /HPF
Glucose, UA: NEGATIVE
Ketones, ur: NEGATIVE
Nitrites, Initial: NEGATIVE
Protein, ur: NEGATIVE
Specific Gravity, Urine: 1.005 (ref 1.001–1.035)
WBC, UA: >=60 /HPF — AB (ref 0–5)
Yeast: NONE SEEN /[HPF]
pH: 5.5 (ref 5.0–8.0)

## 2023-03-21 LAB — URINE CULTURE
MICRO NUMBER:: 15681746
SPECIMEN QUALITY:: ADEQUATE

## 2023-03-21 LAB — CULTURE INDICATED

## 2023-03-21 NOTE — Telephone Encounter (Signed)
Patient requesting refill on sucralfate, she is now rescheduled to December 6 with [facility].

## 2023-03-21 NOTE — Telephone Encounter (Signed)
Called [facility]. The prescription for sucralfate was transmitted on 03/14/23 for a 90 day supply. The delivery was expected to be today. Patient notified.

## 2023-03-21 NOTE — Telephone Encounter (Signed)
This patient was on Amy's schedule for today and was rescheduled to 06/18/23 due to Amy being out.  Patient said one of the main purposes for the visit today was that she needed refills on her medications.  Could you please provide refills for her until her next appointment?  Thank you.

## 2023-03-25 ENCOUNTER — Ambulatory Visit (INDEPENDENT_AMBULATORY_CARE_PROVIDER_SITE_OTHER): Payer: Medicare Other | Admitting: Obstetrics and Gynecology

## 2023-03-25 ENCOUNTER — Encounter: Payer: Self-pay | Admitting: Obstetrics and Gynecology

## 2023-03-25 VITALS — BP 114/72 | HR 74 | Ht 62.25 in | Wt 148.0 lb

## 2023-03-25 DIAGNOSIS — Z01419 Encounter for gynecological examination (general) (routine) without abnormal findings: Secondary | ICD-10-CM | POA: Diagnosis not present

## 2023-03-25 DIAGNOSIS — L309 Dermatitis, unspecified: Secondary | ICD-10-CM | POA: Diagnosis not present

## 2023-03-25 DIAGNOSIS — J302 Other seasonal allergic rhinitis: Secondary | ICD-10-CM | POA: Diagnosis not present

## 2023-03-25 DIAGNOSIS — Z79890 Hormone replacement therapy (postmenopausal): Secondary | ICD-10-CM

## 2023-03-25 MED ORDER — ESTRADIOL 0.5 MG PO TABS: ORAL_TABLET | ORAL | 3 refills | Status: DC

## 2023-03-25 MED ORDER — HYDROCORTISONE ACETATE 25 MG RE SUPP: 25.0000 mg | Freq: Two times a day (BID) | RECTAL | 0 refills | Status: DC

## 2023-03-25 NOTE — Progress Notes (Signed)
77 y.o. y.o. female here for annual exam.   No LMP recorded. Patient has had a hysterectomy.    [id number] Married.  Husband with worsening Alzheimer.    HPI: S/P Total Hysterectomy with history of vaginal mesh years ago. Menopause with persistent hot flushes/night sweats when tried to stop HRT.  Still on Estradiol 0.5 mg tab every 2 days  Feeling better from recent UTI treated.  No pelvic pain.  No pain with IC.  Treating external Hemorrhoids with a CS cream.  Urine/BMs wnl.  Breasts wnl.  Body mass index is 26.85 kg/m.   [facility] with Fam MD. Seen by [facility] last week, [facility] 2016. MMG 11/19/22 neg DXA: ordered  Height 5' 2.25" (1.581 m), weight 148 lb (67.1 kg).  No results found for: "DIAGPAP", "HPVHIGH", "ADEQPAP"  GYN HISTORY: No results found for: "DIAGPAP", "HPVHIGH", "ADEQPAP"  OB History  Gravida Para Term Preterm AB Living  3 2 2   1 2   SAB IAB Ectopic Multiple Live Births  1       2    # Outcome Date GA Lbr Len/2nd Weight Sex Type Anes PTL Lv  3 SAB           2 Term     F Vag-Spont  N LIV  1 Term     M Vag-Spont  N LIV     Birth Comments: has died in a car accident at 40 yrs    Past Medical History:  Diagnosis Date  . Abdominal pain, epigastric 08/22/2009  . Adenomatous colon polyp   . Chronic low back pain   . Diverticulosis   . Duodenal ulcer   . GERD (gastroesophageal reflux disease)   . Hemorrhoids   . Hiatal hernia   . HYPOTHYROIDISM 07/20/2008  . Insomnia   . Keratosis 09/11/2020   Right Hand  . Neuropathy   . NSVD (normal spontaneous vaginal delivery)    X2    Past Surgical History:  Procedure Laterality Date  . ABDOMINAL HYSTERECTOMY     TVH  . BACK SURGERY     RUPTURED DISC  . BLEPHAROPLASTY    . CHOLECYSTECTOMY    . COLONOSCOPY  JAN 2014   NEXT COLONOSCOPY IN 10 YEARS  . DILATION AND CURETTAGE OF UTERUS    . EYE SURGERY  2008   BILATERAL CATARACT   . SHOULDER SURGERY Right 05/20/2020   [facility] Dr Rennis Chris    Current  Outpatient Medications on File Prior to Visit  Medication Sig Dispense Refill  . cycloSPORINE (RESTASIS) 0.05 % ophthalmic emulsion Place 1 drop into the left eye 2 (two) times daily.    Marland Kitchen estradiol (ESTRACE) 0.5 MG tablet TAKE ONE-HALF (1/2) TABLET EVERY OTHER DAY 25 tablet 3  . fexofenadine (ALLEGRA ALLERGY) 60 MG tablet Take 1 tablet (60 mg total) by mouth daily. For allergy symptoms. 30 tablet 1  . fluticasone (FLONASE) 50 MCG/ACT nasal spray Use 1 spray each nostril twice a day for 7 days then decrease to 1 spray each nostril once a day for 7 days.  Can repeat as needed for nasal congestion. 16 g 2  . HYDROcodone-acetaminophen (NORCO) 10-325 MG per tablet Take 0.5 tablets by mouth every 6 (six) hours as needed.    Marland Kitchen levothyroxine (SYNTHROID) 112 MCG tablet TAKE 1 TABLET BY MOUTH EVERY DAY 90 tablet 0  . Multiple Vitamins-Minerals (PRESERVISION AREDS 2 PO) Take 2 tablets by mouth daily in the afternoon.    . nitrofurantoin, macrocrystal-monohydrate, (  MACROBID) 100 MG capsule Take 1 capsule (100 mg total) by mouth 2 (two) times daily. 14 capsule 0  . omeprazole (PRILOSEC) 40 MG capsule Take 1 capsule (40 mg total) by mouth 2 (two) times daily. 180 capsule 0  . pregabalin (LYRICA) 150 MG capsule Take 1 capsule (150 mg total) by mouth 2 (two) times daily. 180 capsule 3  . Psyllium (METAMUCIL PO) Take by mouth.    Marland Kitchen rOPINIRole (REQUIP) 0.25 MG tablet TAKE 1 TABLET AT NIGHT FOR 2 DAYS THEN INCREASE TO 2 TABLETS AT NIGHT 180 tablet 0  . sucralfate (CARAFATE) 1 g tablet Take 1 tablet (1 g total) by mouth 3 (three) times daily between meals. Please keep your November 7th appointment for further refills. Thank you 270 tablet 0  . triamcinolone (KENALOG) 0.025 % ointment Apply 1 Application topically 2 (two) times daily. 30 g 0  . Vibegron (GEMTESA) 75 MG TABS Take 1 tablet by mouth daily.    . vitamin B-12 (CYANOCOBALAMIN) 1000 MCG tablet Take 1,000 mcg by mouth daily.    . fluconazole (DIFLUCAN) 150 MG  tablet Take 150 mg by mouth once. (Patient not taking: Reported on 03/25/2023)     No current facility-administered medications on file prior to visit.    Social History   Socioeconomic History  . Marital status: Widowed    Spouse name: Not on file  . Number of children: 1  . Years of education: Not on file  . Highest education level: Not on file  Occupational History  . Occupation: Retired    Associate Professor: RETIRED  Tobacco Use  . Smoking status: Former    Current packs/day: 0.00    Average packs/day: 1.5 packs/day for 40.0 years (60.0 ttl pk-yrs)    Types: Cigarettes    Start date: 08/28/1963    Quit date: 08/28/2003    Years since quitting: 19.5  . Smokeless tobacco: Never  Vaping Use  . Vaping status: Never Used  Substance and Sexual Activity  . Alcohol use: No    Alcohol/week: 0.0 standard drinks of alcohol  . Drug use: No  . Sexual activity: Not Currently    Partners: Male    Birth control/protection: Surgical    Comment: HYSTERECTOMY, older than 16, less than 5  Other Topics Concern  . Not on file  Social History Narrative   Lives alone, husband passed away 04/17/21   R handed   Caffeine: 2 C of coffee in the AM   Social Determinants of Health   Financial Resource Strain: Low Risk  (08/20/2022)   Overall Financial Resource Strain (CARDIA)   . Difficulty of Paying Living Expenses: Not hard at all  Food Insecurity: No Food Insecurity (08/20/2022)   Hunger Vital Sign   . Worried About Programme researcher, broadcasting/film/video in the Last Year: Never true   . Ran Out of Food in the Last Year: Never true  Transportation Needs: No Transportation Needs (08/20/2022)   PRAPARE - Transportation   . Lack of Transportation (Medical): No   . Lack of Transportation (Non-Medical): No  Physical Activity: Insufficiently Active (08/20/2022)   Exercise Vital Sign   . Days of Exercise per Week: 5 days   . Minutes of Exercise per Session: 20 min  Stress: No Stress Concern Present (08/20/2022)   [employer]  of Occupational Health - Occupational Stress Questionnaire   . Feeling of Stress : Not at all  Social Connections: Moderately Integrated (08/20/2022)   Social Connection and Isolation Panel [NHANES]   .  Frequency of Communication with Friends and Family: More than three times a week   . Frequency of Social Gatherings with Friends and Family: More than three times a week   . Attends Religious Services: More than 4 times per year   . Active Member of Clubs or Organizations: Yes   . Attends Banker Meetings: More than 4 times per year   . Marital Status: Widowed  Intimate Partner Violence: Not At Risk (08/20/2022)   Humiliation, Afraid, Rape, and Kick questionnaire   . Fear of Current or Ex-Partner: No   . Emotionally Abused: No   . Physically Abused: No   . Sexually Abused: No    Family History  Problem Relation Age of Onset  . Cancer Mother        bladder CA  . Diabetes Mother   . Cancer Father        gallblader CA  . Heart disease Father   . Diabetes Sister   . Hypertension Sister   . Diabetes Brother   . Hypertension Brother   . Heart disease Brother   . Heart disease Daughter   . Stomach cancer Neg Hx   . Colon cancer Neg Hx      Allergies  Allergen Reactions  . Aspirin     REACTION: Hives      Patient's last menstrual period was No LMP recorded. Patient has had a hysterectomy..            Review of Systems Alls systems reviewed and are negative.     Physical Exam Constitutional:      Appearance: Normal appearance.  Genitourinary:     Vulva normal.     No lesions in the vagina.     Right Labia: No rash, lesions or skin changes.    Left Labia: No lesions, skin changes or rash.    Vaginal cuff intact.    No vaginal discharge or tenderness.     No vaginal prolapse present.    No vaginal atrophy present.     Right Adnexa: not tender and no mass present.    Left Adnexa: not tender and no mass present.    Cervix is not absent.     Uterus is not  absent. Breasts:    Right: Normal.     Left: Normal.  HENT:     Head: Normocephalic.  Neck:     Thyroid: No thyroid mass, thyromegaly or thyroid tenderness.  Cardiovascular:     Rate and Rhythm: Normal rate and regular rhythm.     Heart sounds: Normal heart sounds, S1 normal and S2 normal.  Pulmonary:     Effort: Pulmonary effort is normal.     Breath sounds: Normal breath sounds and air entry.  Abdominal:     General: There is no distension.     Palpations: Abdomen is soft. There is no mass.     Tenderness: There is no abdominal tenderness. There is no guarding or rebound.  Musculoskeletal:        General: Normal range of motion.     Cervical back: Full passive range of motion without pain, normal range of motion and neck supple. No tenderness.     Right lower leg: No edema.     Left lower leg: No edema.  Neurological:     Mental Status: She is alert.  Skin:    General: Skin is warm.  Psychiatric:        Mood and Affect: Mood normal.  Behavior: Behavior normal.        Thought Content: Thought content normal.  Vitals and nursing note reviewed. Exam conducted with a chaperone present.      A:         Well Woman GYN exam                             P:        Pap smear not indicated Encouraged annual mammogram screening Colon cancer screening up-to-date DXA ordered today Labs and immunizations to do with PMD Discussed breast self exams Encouraged healthy lifestyle practices Encouraged Vit D and Calcium   No follow-ups on file.  Earley Favor

## 2023-04-01 ENCOUNTER — Telehealth: Payer: Self-pay | Admitting: *Deleted

## 2023-04-01 MED ORDER — NITROFURANTOIN MONOHYD MACRO 100 MG PO CAPS: 100.0000 mg | ORAL_CAPSULE | Freq: Two times a day (BID) | ORAL | 0 refills | Status: DC

## 2023-04-01 NOTE — Telephone Encounter (Signed)
Reviewed with Dr. Karma Greaser.  Rx for Macrobid 100 mg PO bid x7 days.  Urogyn referral placed.   Spoke with patient, advised per Dr. Karma Greaser. Patient states she has called Urogyn to schedule, no return call. Advised I will forward to referrals for f/u. Patient verbalizes understanding and is agreeable.   Cassidy Norton -can you f/u on referral to Urogyn.

## 2023-04-01 NOTE — Telephone Encounter (Signed)
Patient left message requesting return call with update. She is leaving out of town this afternoon and is anxious.

## 2023-04-01 NOTE — Telephone Encounter (Signed)
Call returned to patient. Spoke with patient, tx for Ecoli UTI on 03/18/23 with Macrobid 100 mg bid x7 days. Symptoms initially improved and returned over the weekend. Reports same symptoms, pressure in bladder, burning with urination and urinary frequency. Denies fever, chills, N/V, hematuria, flank pain. Patient is traveling out of town this afternoon and will be gone for 1 wk. Asking for additional Rx, pharmacy confirmed. Request return call to cell phone, [phone].   Dr. Karma Greaser -please review and advise.

## 2023-04-08 NOTE — Telephone Encounter (Signed)
Called patient and gave referral info so pt can reach out to provider.

## 2023-04-11 ENCOUNTER — Other Ambulatory Visit: Payer: Self-pay | Admitting: Family Medicine

## 2023-04-15 ENCOUNTER — Other Ambulatory Visit: Payer: Self-pay | Admitting: Internal Medicine

## 2023-04-19 ENCOUNTER — Ambulatory Visit (INDEPENDENT_AMBULATORY_CARE_PROVIDER_SITE_OTHER): Payer: Medicare Other | Admitting: Gastroenterology

## 2023-04-19 ENCOUNTER — Other Ambulatory Visit: Payer: Self-pay | Admitting: Gastroenterology

## 2023-04-19 ENCOUNTER — Encounter: Payer: Self-pay | Admitting: Gastroenterology

## 2023-04-19 ENCOUNTER — Ambulatory Visit (INDEPENDENT_AMBULATORY_CARE_PROVIDER_SITE_OTHER): Payer: Medicare Other | Admitting: Obstetrics and Gynecology

## 2023-04-19 VITALS — BP 116/64 | HR 65 | Temp 97.5°F | Wt 151.0 lb

## 2023-04-19 VITALS — BP 120/70 | HR 71 | Ht 63.0 in | Wt 152.0 lb

## 2023-04-19 DIAGNOSIS — Z860101 Personal history of adenomatous and serrated colon polyps: Secondary | ICD-10-CM | POA: Diagnosis not present

## 2023-04-19 DIAGNOSIS — R350 Frequency of micturition: Secondary | ICD-10-CM | POA: Diagnosis not present

## 2023-04-19 DIAGNOSIS — K219 Gastro-esophageal reflux disease without esophagitis: Secondary | ICD-10-CM

## 2023-04-19 DIAGNOSIS — K649 Unspecified hemorrhoids: Secondary | ICD-10-CM

## 2023-04-19 DIAGNOSIS — Z860100 Personal history of colon polyps, unspecified: Secondary | ICD-10-CM

## 2023-04-19 MED ORDER — NITROFURANTOIN MONOHYD MACRO 100 MG PO CAPS: 100.0000 mg | ORAL_CAPSULE | Freq: Two times a day (BID) | ORAL | 0 refills | Status: DC

## 2023-04-19 MED ORDER — SUCRALFATE 1 G PO TABS: 1.0000 g | ORAL_TABLET | Freq: Three times a day (TID) | ORAL | 3 refills | Status: AC

## 2023-04-19 MED ORDER — LIDOCAINE-HYDROCORTISONE ACE 1-1 % EX CREA: 1.0000 | TOPICAL_CREAM | Freq: Two times a day (BID) | CUTANEOUS | 2 refills | Status: DC | PRN

## 2023-04-19 MED ORDER — FLUCONAZOLE 150 MG PO TABS: 150.0000 mg | ORAL_TABLET | Freq: Once | ORAL | 0 refills | Status: AC

## 2023-04-19 MED ORDER — OMEPRAZOLE 40 MG PO CPDR: 40.0000 mg | DELAYED_RELEASE_CAPSULE | Freq: Two times a day (BID) | ORAL | 3 refills | Status: DC

## 2023-04-19 NOTE — Progress Notes (Signed)
Chief Complaint: Medication refill and colonoscopy recall Primary GI MD: Dr. Marina Goodell  HPI: 77 year old female with medical history as listed below presents for evaluation of medication refill and colonoscopy recall  Last seen May 2022 by Mike Gip PA-C.  History of chronic GERD.  At the last office visit she was having mild solid food dysphagia and fullness in the throat as well as epigastric pain.  She was increased to omeprazole twice daily Carafate 3 times daily.  Underwent EGD July 2022 which was normal.  Also had CT abdomen pelvis with contrast August 2022 for persistent epigastric pain which showed no acute findings.  Showed colonic diverticulosis without diverticulitis.  Mild hepatic steatosis and small benign hemangioma.  Aortic atherosclerosis as well.  Prior cholecystectomy.  Last labs July 2024 including CMP and CBC were unrevealing.  Normal kidney function.  Discussed the use of AI scribe software for clinical note transcription with the patient, who gave verbal consent to proceed.  The patient, with a history of gastroesophageal reflux disease (GERD) managed with omeprazole and Carafate, presented primarily for a consultation regarding a colonoscopy. She received a letter indicating it was time for the procedure and needed to discuss medication management. The patient's last colonoscopy in 2014 was normal, and she denied any lower gastrointestinal symptoms such as rectal bleeding, change in bowel habits, or weight loss. She reported occasional hemorrhoids, which have been managed with a hydrocortisone lidocaine medication (she would like a refill of this to have on hand).  Patient states she no longer has dysphagia.  Her GERD is well-controlled on omeprazole twice daily and Carafate up to 3 times a day.  States she does not always have to take her Carafate 3 times a day.  The patient also mentioned a significant weight gain of fifteen pounds over the last three years, attributed to  the initiation of Lyrica for neuropathy. She expressed concern about recurrent urinary tract infections, with three episodes since the beginning of November.      PREVIOUS GI WORKUP   EGD in July 2022 - The esophagus was normal.  - The stomach was normal, save hiatal hernia.  - The examined duodenum was normal save some mild scarring from prior ulcer disease.  - The cardia and gastric fundus were normal on retroflexion.  Colonoscopy January 2014 - Severe diverticulosis - Internal hemorrhoids - Otherwise normal  Colonoscopy 01/2007 - Diverticulosis - 5 mm tubular adenoma in sigmoid colon - Repeat 5 years  Past Medical History:  Diagnosis Date   Abdominal pain, epigastric 08/22/2009   Adenomatous colon polyp    Chronic low back pain    Diverticulosis    Duodenal ulcer    GERD (gastroesophageal reflux disease)    Hemorrhoids    Hiatal hernia    HYPOTHYROIDISM 07/20/2008   Insomnia    Keratosis 09/11/2020   Right Hand   Neuropathy    NSVD (normal spontaneous vaginal delivery)    X2    Past Surgical History:  Procedure Laterality Date   ABDOMINAL HYSTERECTOMY     TVH   BACK SURGERY     RUPTURED DISC   BLEPHAROPLASTY     CHOLECYSTECTOMY     COLONOSCOPY  JAN 2014   NEXT COLONOSCOPY IN 10 YEARS   DILATION AND CURETTAGE OF UTERUS     EYE SURGERY  2008   BILATERAL CATARACT    SHOULDER SURGERY Right 05/20/2020   [facility] Dr Rennis Chris    Current Outpatient Medications  Medication Sig Dispense Refill  cycloSPORINE (RESTASIS) 0.05 % ophthalmic emulsion Place 1 drop into the left eye 2 (two) times daily.     estradiol (ESTRACE) 0.5 MG tablet TAKE ONE-HALF (1/2) TABLET EVERY OTHER DAY 25 tablet 3   fexofenadine (ALLEGRA ALLERGY) 60 MG tablet Take 1 tablet (60 mg total) by mouth daily. For allergy symptoms. 30 tablet 1   fluconazole (DIFLUCAN) 150 MG tablet Take 150 mg by mouth once.     HYDROcodone-acetaminophen (NORCO) 10-325 MG per tablet Take 0.5 tablets by  mouth every 6 (six) hours as needed.     hydrocortisone (ANUSOL-HC) 25 MG suppository Place 1 suppository (25 mg total) rectally 2 (two) times daily. (Patient taking differently: Place 25 mg rectally 2 (two) times daily as needed.) 12 suppository 0   levothyroxine (SYNTHROID) 112 MCG tablet TAKE 1 TABLET DAILY 30 tablet 0   Multiple Vitamins-Minerals (PRESERVISION AREDS 2 PO) Take 2 tablets by mouth daily in the afternoon.     nitrofurantoin, macrocrystal-monohydrate, (MACROBID) 100 MG capsule Take 1 capsule (100 mg total) by mouth 2 (two) times daily. 14 capsule 0   pregabalin (LYRICA) 150 MG capsule Take 1 capsule (150 mg total) by mouth 2 (two) times daily. 180 capsule 3   Psyllium (METAMUCIL PO) Take by mouth.     rOPINIRole (REQUIP) 0.25 MG tablet TAKE 1 TABLET AT NIGHT FOR 2 DAYS THEN INCREASE TO 2 TABLETS AT NIGHT 180 tablet 0   Vibegron (GEMTESA) 75 MG TABS Take 1 tablet by mouth daily.     vitamin B-12 (CYANOCOBALAMIN) 1000 MCG tablet Take 1,000 mcg by mouth daily.     Lidocaine-Hydrocortisone Ace 1-1 % CREA Apply 1 Application topically 2 (two) times daily as needed. For 14 days 30 g 2   omeprazole (PRILOSEC) 40 MG capsule Take 1 capsule (40 mg total) by mouth 2 (two) times daily. 180 capsule 3   sucralfate (CARAFATE) 1 g tablet Take 1 tablet (1 g total) by mouth 3 (three) times daily between meals. Please keep your November 7th appointment for further refills. Thank you 270 tablet 3   No current facility-administered medications for this visit.    Allergies as of 04/19/2023 - Review Complete 04/19/2023  Allergen Reaction Noted   Aspirin  12/21/2008    Family History  Problem Relation Age of Onset   Bladder Cancer Mother        non smoker   Cancer Father        gallblader CA   Heart disease Father    Diabetes Sister    Hypertension Sister    Diabetes Brother    Hypertension Brother    Heart disease Brother    Heart disease Daughter    Stomach cancer Neg Hx    Colon  cancer Neg Hx     Social History   Socioeconomic History   Marital status: Widowed    Spouse name: Not on file   Number of children: 1   Years of education: Not on file   Highest education level: Not on file  Occupational History   Occupation: Retired    Associate Professor: RETIRED  Tobacco Use   Smoking status: Former    Current packs/day: 0.00    Average packs/day: 1.5 packs/day for 40.0 years (60.0 ttl pk-yrs)    Types: Cigarettes    Start date: 08/28/1963    Quit date: 08/28/2003    Years since quitting: 19.6   Smokeless tobacco: Never  Vaping Use   Vaping status: Never Used  Substance and Sexual Activity  Alcohol use: No    Alcohol/week: 0.0 standard drinks of alcohol   Drug use: No   Sexual activity: Not Currently    Partners: Male    Birth control/protection: Surgical    Comment: HYSTERECTOMY, older than 16, less than 5  Other Topics Concern   Not on file  Social History Narrative   Lives alone, husband passed away 05/11/21   R handed   Caffeine: 2 C of coffee in the AM   Social Determinants of Health   Financial Resource Strain: Low Risk  (08/20/2022)   Overall Financial Resource Strain (CARDIA)    Difficulty of Paying Living Expenses: Not hard at all  Food Insecurity: No Food Insecurity (08/20/2022)   Hunger Vital Sign    Worried About Running Out of Food in the Last Year: Never true    Ran Out of Food in the Last Year: Never true  Transportation Needs: No Transportation Needs (08/20/2022)   PRAPARE - Administrator, Civil Service (Medical): No    Lack of Transportation (Non-Medical): No  Physical Activity: Insufficiently Active (08/20/2022)   Exercise Vital Sign    Days of Exercise per Week: 5 days    Minutes of Exercise per Session: 20 min  Stress: No Stress Concern Present (08/20/2022)   [employer] of Occupational Health - Occupational Stress Questionnaire    Feeling of Stress : Not at all  Social Connections: Moderately Integrated (08/20/2022)   Social  Connection and Isolation Panel [NHANES]    Frequency of Communication with Friends and Family: More than three times a week    Frequency of Social Gatherings with Friends and Family: More than three times a week    Attends Religious Services: More than 4 times per year    Active Member of [employer] or Organizations: Yes    Attends Banker Meetings: More than 4 times per year    Marital Status: Widowed  Intimate Partner Violence: Not At Risk (08/20/2022)   Humiliation, Afraid, Rape, and Kick questionnaire    Fear of Current or Ex-Partner: No    Emotionally Abused: No    Physically Abused: No    Sexually Abused: No    Review of Systems:    Constitutional: No weight loss, fever, chills, weakness or fatigue HEENT: Eyes: No change in vision               Ears, Nose, Throat:  No change in hearing or congestion Skin: No rash or itching Cardiovascular: No chest pain, chest pressure or palpitations   Respiratory: No SOB or cough Gastrointestinal: See HPI and otherwise negative Genitourinary: No dysuria or change in urinary frequency Neurological: No headache, dizziness or syncope Musculoskeletal: No new muscle or joint pain Hematologic: No bleeding or bruising Psychiatric: No history of depression or anxiety    Physical Exam:  Vital signs: BP 120/70   Pulse 71   Ht 5\' 3"  (1.6 m)   Wt 152 lb (68.9 kg)   BMI 26.93 kg/m   Constitutional: NAD, Well developed, Well nourished, alert and cooperative.  Appears younger than stated age Head:  Normocephalic and atraumatic. Eyes:   PEERL, EOMI. No icterus. Conjunctiva pink. Respiratory: Respirations even and unlabored. Lungs clear to auscultation bilaterally.   No wheezes, crackles, or rhonchi.  Cardiovascular:  Regular rate and rhythm. No peripheral edema, cyanosis or pallor.  Rectal:  Not performed.  Msk:  Symmetrical without gross deformities. Without edema, no deformity or joint abnormality.  Neurologic:  Alert and  oriented x4;   grossly normal neurologically.  Skin:   Dry and intact without significant lesions or rashes. Psychiatric: Oriented to person, place and time. Demonstrates good judgement and reason without abnormal affect or behaviors.   RELEVANT LABS AND IMAGING: CBC    Component Value Date/Time   WBC 7.3 12/10/2022 2156   RBC 4.20 12/10/2022 2156   HGB 13.6 12/10/2022 2156   HCT 39.5 12/10/2022 2156   PLT 184 12/10/2022 2156   MCV 94.0 12/10/2022 2156   MCH 32.4 12/10/2022 2156   MCHC 34.4 12/10/2022 2156   RDW 13.2 12/10/2022 2156   LYMPHSABS 3.8 03/06/2022 1153   MONOABS 0.6 03/06/2022 1153   EOSABS 0.1 03/06/2022 1153   BASOSABS 0.0 03/06/2022 1153    CMP     Component Value Date/Time   NA 141 12/10/2022 2156   K 3.6 12/10/2022 2156   CL 103 12/10/2022 2156   CO2 24 12/10/2022 2156   GLUCOSE 121 (H) 12/10/2022 2156   BUN 9 12/10/2022 2156   CREATININE 0.71 12/10/2022 2156   CALCIUM 9.1 12/10/2022 2156   PROT 6.2 (L) 12/10/2022 2156   PROT 6.3 10/02/2021 1102   ALBUMIN 3.5 12/10/2022 2156   AST 18 12/10/2022 2156   ALT 17 12/10/2022 2156   ALKPHOS 72 12/10/2022 2156   BILITOT 0.5 12/10/2022 2156   GFRNONAA >60 12/10/2022 2156     Assessment/Plan:      Gastroesophageal Reflux Disease (GERD) Dysphagia Well controlled on Omeprazole 40mg  twice daily and Carafate three times daily. No recent dysphagia. --Continue Omeprazole 40mg  twice daily and Carafate three times daily. --Discussed risks of long-term PPI use and long-term Carafate use.  Recent labs were unremarkable reassuringly. --Refill prescriptions. - Educated patient on lifestyle modifications - Follow-up 1 year  Hemorrhoids Intermittent flares, no recent bleeding. Previously responded well to Animental. -Refill Animental prescription.  Colon Cancer Screening Small tubular adenoma on colonoscopy in 2008. Last colonoscopy in 2014 was normal. No current lower GI symptoms or family history of colon cancer. Patient is  77 years old and prefers to hold off on colonoscopy unless symptoms develop. She does have -Defer colonoscopy unless symptoms develop.  Possible Urinary Tract Infection Patient reports possible symptoms of a urinary tract infection. This is the third potential infection since November 2024. -Recommend patient seek immediate evaluation at primary care or urgent care for urinalysis and potential antibiotic treatment.       Lara Mulch [facility] 04/19/2023, 9:34 AM  Cc: Kristian Covey, MD

## 2023-04-19 NOTE — Progress Notes (Signed)
Agree with the evaluation and plans as outlined.

## 2023-04-19 NOTE — Progress Notes (Signed)
   Acute Office Visit  Subjective:    [name], female    DOB: 1945/12/17, 77 y.o.   MRN: [id number]   HPI 77 y.o. presents today for UTI (Complains of recurrent UTI's (3 since 03/2023).  Now complains of urinary frequency, dysuria, cloudy urine, voiding small amounts.  Symptoms began 3 days ago.) No fevers or cva tenderness Reports some nocturia as well, which is new for her. Allergies  Allergen Reactions   Aspirin     REACTION: Hives     No LMP recorded. Patient has had a hysterectomy.    Review of Systems     Objective:    Physical Exam Constitutional:      Appearance: Normal appearance.  Musculoskeletal:     Cervical back: Normal range of motion.  Neurological:     Mental Status: She is alert and oriented to person, place, and time.  Psychiatric:        Mood and Affect: Mood normal.        Behavior: Behavior normal.  Vitals and nursing note reviewed.   No cva tenderness b/l  BP 116/64 (BP Location: Left Arm, Patient Position: Sitting, Cuff Size: Normal)   Pulse 65   Temp (!) 97.5 F (36.4 C) (Oral)   Wt 151 lb (68.5 kg)   SpO2 97%   BMI 26.75 kg/m  Wt Readings from Last 3 Encounters:  04/19/23 151 lb (68.5 kg)  04/19/23 152 lb (68.9 kg)  03/25/23 148 lb (67.1 kg)      UA with 1+ blood 1+ LE many bacteria wbc 40-60  Patient informed chaperone available to be present for breast and/or pelvic exam. Patient has requested no chaperone to be present. Patient has been advised what will be completed during breast and pelvic exam.   Assessment & Plan:  Recurrent UTI To begin macrobid. Diflucan for yeast prevention after. Urogyn appointment mid January RTC with persistent/worsening s/s.  She agreed.  Earley Favor

## 2023-04-19 NOTE — Patient Instructions (Signed)
We have sent the following medications to your pharmacy for you to pick up at your convenience: Hydrocortisone/Lidocaine ointment  Omeprazole  Carafate  _______________________________________________________  If your blood pressure at your visit was 140/90 or greater, please contact your primary care physician to follow up on this.  _______________________________________________________  If you are age 79 or older, your body mass index should be between 23-30. Your Body mass index is 26.93 kg/m. If this is out of the aforementioned range listed, please consider follow up with your Primary Care Provider.  If you are age 40 or younger, your body mass index should be between 19-25. Your Body mass index is 26.93 kg/m. If this is out of the aformentioned range listed, please consider follow up with your Primary Care Provider.   ________________________________________________________  The [facility] providers would like to encourage you to use [facility] to communicate with providers for non-urgent requests or questions.  Due to long hold times on the telephone, sending your provider a message by [facility] may be a faster and more efficient way to get a response.  Please allow 48 business hours for a response.  Please remember that this is for non-urgent requests.  _______________________________________________________   I appreciate the  opportunity to care for you  Thank You   [facility]

## 2023-04-21 LAB — URINALYSIS, COMPLETE W/RFL CULTURE
Bilirubin Urine: NEGATIVE
Glucose, UA: NEGATIVE
Hyaline Cast: NONE SEEN /LPF
Ketones, ur: NEGATIVE
Nitrites, Initial: NEGATIVE
Protein, ur: NEGATIVE
Specific Gravity, Urine: 1.01 (ref 1.001–1.035)
pH: 6.5 (ref 5.0–8.0)

## 2023-04-21 LAB — URINE CULTURE
MICRO NUMBER:: 15818744
Result:: NO GROWTH
SPECIMEN QUALITY:: ADEQUATE

## 2023-04-21 LAB — CULTURE INDICATED

## 2023-04-29 ENCOUNTER — Other Ambulatory Visit: Payer: Self-pay | Admitting: Family Medicine

## 2023-04-29 DIAGNOSIS — G2581 Restless legs syndrome: Secondary | ICD-10-CM

## 2023-05-02 ENCOUNTER — Other Ambulatory Visit: Payer: Self-pay

## 2023-05-02 DIAGNOSIS — J302 Other seasonal allergic rhinitis: Secondary | ICD-10-CM

## 2023-05-03 MED ORDER — FEXOFENADINE HCL 60 MG PO TABS: 60.0000 mg | ORAL_TABLET | Freq: Every day | ORAL | 1 refills | Status: DC

## 2023-05-04 DIAGNOSIS — M1711 Unilateral primary osteoarthritis, right knee: Secondary | ICD-10-CM | POA: Diagnosis not present

## 2023-05-17 ENCOUNTER — Ambulatory Visit: Payer: Self-pay | Admitting: Family Medicine

## 2023-05-17 NOTE — Telephone Encounter (Signed)
 Chief Complaint: dizziness Symptoms: heaviness in legs, numbness and tingling to both legs, blurry vision, change in balance, arm heaviness Frequency: x 1 week Pertinent Negatives: Patient denies chest pain, breathing difficulty, palpitations, changes in speech, headaches, facial drooping  Disposition: [x] ED /[] Urgent Care (no appt availability in office) / [] Appointment(In office/virtual)/ []  [facility]/ [] Home Care/ [x] Refused Recommended Disposition /[] [facility] [place]/ []  Follow-up with PCP Additional Notes: Patient states she thinks this could be related to her neuropathy. Advised patient have her daughter drive her to the ED. Patient refusing ED and requesting to come in for visit with PCP and have lab work done.    Copied from CRM [id number]. Topic: Clinical - Red Word Triage >> May 17, 2023  8:06 AM Thersia BROCKS wrote: Red Word that prompted transfer to Nurse Triage: Patient called in saying she feels dizzy, her legs have been feeling numb. She doesn't feel sick but her legs are numb and she has been feeling dizzy for a couple days now when she wakes up Reason for Disposition  Loss of vision or double vision  (Exception: Similar to previous migraines.)  Answer Assessment - Initial Assessment Questions 1. SYMPTOM: What is the main symptom you are concerned about? (e.g., weakness, numbness)     Numbness and tingling from both knees radiating down both legs. Dizziness.  2. ONSET: When did this start? (minutes, hours, days; while sleeping)     X 1 week.  3. LAST NORMAL: When was the last time you (the patient) were normal (no symptoms)?     Pt states about a week or so ago.  4. PATTERN Does this come and go, or has it been constant since it started?  Is it present now?     Pt states the dizziness and swimming feeling in head was constant; states numbness and tingling with heaviness comes and goes in legs and is worse in the morning.  5. CARDIAC SYMPTOMS:  Have you had any of the following symptoms: chest pain, difficulty breathing, palpitations?     Denies.  6. NEUROLOGIC SYMPTOMS: Have you had any of the following symptoms: headache, dizziness, vision loss, double vision, changes in speech, unsteady on your feet?     Dizziness; worsening long distance vision since October. Blurry vision x 1 week.  7. OTHER SYMPTOMS: Do you have any other symptoms?     Feels like head is swimming; cramps in calves of both legs.  Answer Assessment - Initial Assessment Questions 1. DESCRIPTION: Describe your dizziness.     Head swimming and lightheaded.  2. LIGHTHEADED: Do you feel lightheaded? (e.g., somewhat faint, woozy, weak upon standing)     When getting up and walking, feeling off balance.  3. VERTIGO: Do you feel like either you or the room is spinning or tilting? (i.e. vertigo)     Denies. 4. SEVERITY: How bad is it?  Do you feel like you are going to faint? Can you stand and walk?   - MILD: Feels slightly dizzy, but walking normally.   - MODERATE: Feels unsteady when walking, but not falling; interferes with normal activities (e.g., school, work).   - SEVERE: Unable to walk without falling, or requires assistance to walk without falling; feels like passing out now.      Mild to moderate.  5. ONSET:  When did the dizziness begin?     X 1 week.  6. AGGRAVATING FACTORS: Does anything make it worse? (e.g., standing, change in head position)  Pt unsure anything that makes it worse.  7. CAUSE: What do you think is causing the dizziness?     Pt unsure, states she hasn't changed any meds or foods.  8. RECURRENT SYMPTOM: Have you had dizziness before? If Yes, ask: When was the last time? What happened that time?     Pt states she has felt dizzy in the past before but not to this degree and for this long. She states she was told it was vertigo by her PCP.  9. OTHER SYMPTOMS: Do you have any other symptoms?  (e.g., fever, chest pain, vomiting, diarrhea, bleeding)       Numbness and tingling in legs, heaviness in arms and legs, blurred vision.  Protocols used: Neurologic Deficit-A-AH, Dizziness - Lightheadedness-A-AH

## 2023-05-20 ENCOUNTER — Encounter: Payer: Self-pay | Admitting: Family Medicine

## 2023-05-20 ENCOUNTER — Ambulatory Visit (INDEPENDENT_AMBULATORY_CARE_PROVIDER_SITE_OTHER): Payer: Medicare Other | Admitting: Family Medicine

## 2023-05-20 VITALS — BP 120/60 | HR 81 | Temp 97.5°F | Ht 63.0 in | Wt 150.0 lb

## 2023-05-20 DIAGNOSIS — G6289 Other specified polyneuropathies: Secondary | ICD-10-CM

## 2023-05-20 DIAGNOSIS — R252 Cramp and spasm: Secondary | ICD-10-CM | POA: Diagnosis not present

## 2023-05-20 DIAGNOSIS — E038 Other specified hypothyroidism: Secondary | ICD-10-CM

## 2023-05-20 DIAGNOSIS — R5383 Other fatigue: Secondary | ICD-10-CM | POA: Diagnosis not present

## 2023-05-20 LAB — COMPREHENSIVE METABOLIC PANEL
ALT: 14 U/L (ref 0–35)
AST: 15 U/L (ref 0–37)
Albumin: 4.4 g/dL (ref 3.5–5.2)
Alkaline Phosphatase: 91 U/L (ref 39–117)
BUN: 10 mg/dL (ref 6–23)
CO2: 28 meq/L (ref 19–32)
Calcium: 9.6 mg/dL (ref 8.4–10.5)
Chloride: 104 meq/L (ref 96–112)
Creatinine, Ser: 0.65 mg/dL (ref 0.40–1.20)
GFR: 83.53 mL/min (ref >60.00)
Glucose, Bld: 88 mg/dL (ref 70–99)
Potassium: 4.1 meq/L (ref 3.5–5.1)
Sodium: 141 meq/L (ref 135–145)
Total Bilirubin: 0.6 mg/dL (ref 0.2–1.2)
Total Protein: 7.1 g/dL (ref 6.0–8.3)

## 2023-05-20 LAB — CBC WITH DIFFERENTIAL/PLATELET
Basophils Absolute: 0 10*3/uL (ref 0.0–0.1)
Basophils Relative: 0.3 % (ref 0.0–3.0)
Eosinophils Absolute: 0.1 10*3/uL (ref 0.0–0.7)
Eosinophils Relative: 0.6 % (ref 0.0–5.0)
HCT: 43.2 % (ref 36.0–46.0)
Hemoglobin: 14.5 g/dL (ref 12.0–15.0)
Lymphocytes Relative: 34.9 % (ref 12.0–46.0)
Lymphs Abs: 3.9 10*3/uL (ref 0.7–4.0)
MCHC: 33.7 g/dL (ref 30.0–36.0)
MCV: 94.9 fL (ref 78.0–100.0)
Monocytes Absolute: 0.7 10*3/uL (ref 0.1–1.0)
Monocytes Relative: 6.1 % (ref 3.0–12.0)
Neutro Abs: 6.4 10*3/uL (ref 1.4–7.7)
Neutrophils Relative %: 58.1 % (ref 43.0–77.0)
Platelets: 220 10*3/uL (ref 150.0–400.0)
RBC: 4.55 Mil/uL (ref 3.87–5.11)
RDW: 13.5 % (ref 11.5–15.5)
WBC: 11.1 10*3/uL — ABNORMAL HIGH (ref 4.0–10.5)

## 2023-05-20 LAB — MAGNESIUM: Magnesium: 1.9 mg/dL (ref 1.5–2.5)

## 2023-05-20 LAB — TSH: TSH: 2.53 u[IU]/mL (ref 0.35–5.50)

## 2023-05-20 NOTE — Telephone Encounter (Signed)
 Please advise you do not have any openings until 05/21/2022.

## 2023-05-20 NOTE — Telephone Encounter (Signed)
 Pt was seen today in office.

## 2023-05-20 NOTE — Progress Notes (Signed)
 Established Patient Office Visit  Subjective   [name], female    DOB: 10/12/1945  Age: 78 y.o. MRN: [id number]  Chief Complaint  Patient presents with   Dizziness    HPI   Has history of hypothyroidism, GERD, idiopathic peripheral neuropathy, osteopenia, history of B12 deficiency.  She is seen today with unsteady gait and dizziness for the past 5 days or so.  Sense of instability.  She is also complaining of increased leg cramps at night and general fatigue and cold intolerance.  She does have hypothyroidism is on levothyroxine  112 mcg daily.  Has not had TSH in over a year.  Compliant with therapy.  Feels like she is hydrating fairly well.  She has chronic bilateral hearing loss but no recent acute changes.  No sinusitis symptoms.  Denies any nausea or vomiting.  Has not noted directional component to her dizziness.  Has had vertigo in the past but she did not recall this.  Denies any focal weakness, speech changes, or swallowing difficulties.  Past Medical History:  Diagnosis Date   Abdominal pain, epigastric 08/22/2009   Adenomatous colon polyp    Chronic low back pain    Diverticulosis    Duodenal ulcer    GERD (gastroesophageal reflux disease)    Hemorrhoids    Hiatal hernia    HYPOTHYROIDISM 07/20/2008   Insomnia    Keratosis 09/11/2020   Right Hand   Neuropathy    NSVD (normal spontaneous vaginal delivery)    X2   Past Surgical History:  Procedure Laterality Date   ABDOMINAL HYSTERECTOMY     TVH   BACK SURGERY     RUPTURED DISC   BLEPHAROPLASTY     CHOLECYSTECTOMY     COLONOSCOPY  JAN 2014   NEXT COLONOSCOPY IN 10 YEARS   DILATION AND CURETTAGE OF UTERUS     EYE SURGERY  2008   BILATERAL CATARACT    SHOULDER SURGERY Right 05/20/2020   [facility] Dr Melita    reports that she quit smoking about 19 years ago. Her smoking use included cigarettes. She started smoking about 59 years ago. She has a 60 pack-year smoking history. She  has never used smokeless tobacco. She reports that she does not drink alcohol and does not use drugs. family history includes Bladder Cancer in her mother; Cancer in her father; Diabetes in her brother and sister; Heart disease in her brother, daughter, and father; Hypertension in her brother and sister. Allergies  Allergen Reactions   Aspirin     REACTION: Hives    Review of Systems  Constitutional:  Positive for malaise/fatigue. Negative for chills and fever.  Eyes:  Negative for blurred vision.  Respiratory:  Negative for shortness of breath.   Cardiovascular:  Negative for chest pain and leg swelling.  Genitourinary:  Negative for dysuria.  Neurological:  Positive for dizziness. Negative for speech change, focal weakness and headaches.      Objective:     BP 120/60   Pulse 81   Temp (!) 97.5 F (36.4 C) (Oral)   Ht 5' 3 (1.6 m)   Wt 150 lb (68 kg)   SpO2 95%   BMI 26.57 kg/m  BP Readings from Last 3 Encounters:  05/20/23 120/60  04/19/23 116/64  04/19/23 120/70   Wt Readings from Last 3 Encounters:  05/20/23 150 lb (68 kg)  04/19/23 151 lb (68.5 kg)  04/19/23 152 lb (68.9 kg)      Physical Exam  Vitals reviewed.  Constitutional:      General: She is not in acute distress.    Appearance: She is not ill-appearing.  Cardiovascular:     Rate and Rhythm: Normal rate and regular rhythm.  Pulmonary:     Effort: Pulmonary effort is normal.     Breath sounds: Normal breath sounds. No wheezing or rales.  Musculoskeletal:     Cervical back: Neck supple.  Lymphadenopathy:     Cervical: No cervical adenopathy.  Neurological:     General: No focal deficit present.     Mental Status: She is alert.     Cranial Nerves: No cranial nerve deficit.      No results found for any visits on 05/20/23.    The 10-year ASCVD risk score (Arnett DK, et al., 2019) is: 20.1%    Assessment & Plan:   78 year old female who presents with multiple symptoms including fatigue,  cold intolerance, bilateral leg cramps, increased dizziness with sounds more like vertigo.  Dizziness and vertigo mildly reduced when going from seated to supine with head 45 degrees to the right and also when sitting up in that position.  Suspect peripheral positional vertigo.  She does have hypothyroidism and overdue for labs.  Etiology of fatigue unclear.  She is also having some recent increased leg cramps.  Recommend the following  -Change positions slowly.  She is at very high risk for falls with her chronic neuropathy. -Handicap working form completed -Check labs including CMP, TSH, magnesium, CBC -If vertigo symptoms persist consider vestibular rehab.   No follow-ups on file.    Wolm Scarlet, MD

## 2023-05-27 ENCOUNTER — Other Ambulatory Visit: Payer: Self-pay | Admitting: Family Medicine

## 2023-05-27 ENCOUNTER — Ambulatory Visit: Payer: Medicare Other | Admitting: Obstetrics

## 2023-06-10 DIAGNOSIS — M170 Bilateral primary osteoarthritis of knee: Secondary | ICD-10-CM | POA: Diagnosis not present

## 2023-06-10 DIAGNOSIS — N3281 Overactive bladder: Secondary | ICD-10-CM | POA: Diagnosis not present

## 2023-06-10 DIAGNOSIS — Z87440 Personal history of urinary (tract) infections: Secondary | ICD-10-CM | POA: Diagnosis not present

## 2023-06-10 DIAGNOSIS — M179 Osteoarthritis of knee, unspecified: Secondary | ICD-10-CM | POA: Insufficient documentation

## 2023-06-10 DIAGNOSIS — N3941 Urge incontinence: Secondary | ICD-10-CM | POA: Diagnosis not present

## 2023-06-18 ENCOUNTER — Ambulatory Visit: Payer: Medicare Other | Admitting: Physician Assistant

## 2023-06-25 DIAGNOSIS — M1711 Unilateral primary osteoarthritis, right knee: Secondary | ICD-10-CM | POA: Diagnosis not present

## 2023-07-02 DIAGNOSIS — M1711 Unilateral primary osteoarthritis, right knee: Secondary | ICD-10-CM | POA: Diagnosis not present

## 2023-07-09 ENCOUNTER — Ambulatory Visit: Payer: Self-pay | Admitting: Family Medicine

## 2023-07-09 DIAGNOSIS — M1711 Unilateral primary osteoarthritis, right knee: Secondary | ICD-10-CM | POA: Diagnosis not present

## 2023-07-09 NOTE — Telephone Encounter (Signed)
  Chief Complaint: wanting medication for the flu Symptoms: congestion Frequency: since the weekend Pertinent Negatives: Patient denies SOB, CP, fever, green/yellow mucus, N/V/D, weakness, dizziness Disposition: [] ED /[] Urgent Care (no appt availability in office) / [] Appointment(In office/virtual)/ []  [facility]/ [x] Home Care/ [] Refused Recommended Disposition /[] [facility]/ []  Follow-up with PCP Additional Notes: Pt reports concerns about getting the flu. Pt states she was with her family last Thursday and since then, multiple family members have tested positive for the flu. At this time, pt reports "a little" congestion. No nasal drainage or mucus. No fever, N/V, weakness. No CP or SOB. Also endorses a little fatigue, states her knee is bothering her (she received an injection in that knee today). RN advised she would relay the concern for flu/anti-viral medication to the appropriate party for follow-up. RN advised pt she needs to call us back if she begins feeling worse and she verbalized understanding.   Copied from CRM [id number]. Topic: Clinical - Medication Question >> Jul 09, 2023  3:28 PM Denese Killings wrote: Reason for CRM: Family has been diagnosed for the Flu and she was with them last week. Patient wants to know if a medication can be called in or can she get an over the counter medication to prevent her from getting the flu. Reason for Disposition  [1] Sinus congestion as part of a cold AND [2] present < 10 days  Answer Assessment - Initial Assessment Questions 1. LOCATION: "Where does it hurt?"      Weekend 2. ONSET: "When did the sinus pain start?"  (e.g., hours, days)      No pain 3. SEVERITY: "How bad is the pain?"   (Scale 1-10; mild, moderate or severe)   - MILD (1-3): doesn't interfere with normal activities    - MODERATE (4-7): interferes with normal activities (e.g., work or school) or awakens from sleep   - SEVERE (8-10): excruciating pain and  patient unable to do any normal activities        None 4. RECURRENT SYMPTOM: "Have you ever had sinus problems before?" If Yes, ask: "When was the last time?" and "What happened that time?"      No 5. NASAL CONGESTION: "Is the nose blocked?" If Yes, ask: "Can you open it or must you breathe through your mouth?"     Does have nasal congestion, but pt can breathe through her nose 6. NASAL DISCHARGE: "Do you have discharge from your nose?" If so ask, "What color?"     None 7. FEVER: "Do you have a fever?" If Yes, ask: "What is it, how was it measured, and when did it start?"      No 8. OTHER SYMPTOMS: "Do you have any other symptoms?" (e.g., sore throat, cough, earache, difficulty breathing)     Patient's family has the flu and she was with them last week. Pt concerned she may get the flu. Endorses some congestion. Has not been with family since last Thursday. Endorses fatigue. Knee pain, getting injections. No CP or SOB.  Protocols used: Sinus Pain or Congestion-A-AH

## 2023-08-13 ENCOUNTER — Other Ambulatory Visit: Payer: Self-pay | Admitting: Family Medicine

## 2023-08-13 NOTE — Telephone Encounter (Unsigned)
 Copied from CRM [id number]. Topic: Clinical - Medication Refill >> Aug 13, 2023  8:30 AM Almira Coaster wrote: Most Recent Primary Care Visit:  Provider: Kristian Covey  Department: [facility]  Visit Type: OFFICE VISIT  Date: 05/20/2023  Medication: levothyroxine (SYNTHROID) 112 MCG tablet  Has the patient contacted their pharmacy? Yes (Agent: If no, request that the patient contact the pharmacy for the refill. If patient does not wish to contact the pharmacy document the reason why and proceed with request.) (Agent: If yes, when and what did the pharmacy advise?)  Is this the correct pharmacy for this prescription? Yes If no, delete pharmacy and type the correct one.  This is the patient's preferred pharmacy:    [facility] - [place], [place] - [address] [place] [address] Phone: [phone] Fax: [phone]   Has the prescription been filled recently? No  Is the patient out of the medication? No  Has the patient been seen for an appointment in the last year OR does the patient have an upcoming appointment? Yes  Can we respond through MyChart? Yes  Agent: Please be advised that Rx refills may take up to 3 business days. We ask that you follow-up with your pharmacy.

## 2023-08-14 MED ORDER — LEVOTHYROXINE SODIUM 112 MCG PO TABS: 112.0000 ug | ORAL_TABLET | Freq: Every day | ORAL | 0 refills | Status: DC

## 2023-08-19 ENCOUNTER — Ambulatory Visit (INDEPENDENT_AMBULATORY_CARE_PROVIDER_SITE_OTHER): Admitting: Nurse Practitioner

## 2023-08-19 ENCOUNTER — Encounter: Payer: Self-pay | Admitting: Nurse Practitioner

## 2023-08-19 VITALS — BP 116/74 | HR 73 | Ht 63.0 in | Wt 151.0 lb

## 2023-08-19 DIAGNOSIS — N898 Other specified noninflammatory disorders of vagina: Secondary | ICD-10-CM

## 2023-08-19 DIAGNOSIS — R300 Dysuria: Secondary | ICD-10-CM | POA: Diagnosis not present

## 2023-08-19 DIAGNOSIS — B3731 Acute candidiasis of vulva and vagina: Secondary | ICD-10-CM

## 2023-08-19 DIAGNOSIS — N3000 Acute cystitis without hematuria: Secondary | ICD-10-CM

## 2023-08-19 LAB — WET PREP FOR TRICH, YEAST, CLUE

## 2023-08-19 MED ORDER — FLUCONAZOLE 150 MG PO TABS
150.0000 mg | ORAL_TABLET | ORAL | 0 refills | Status: DC
Start: 2023-08-19 — End: 2023-11-25

## 2023-08-19 MED ORDER — SULFAMETHOXAZOLE-TRIMETHOPRIM 800-160 MG PO TABS: 1.0000 | ORAL_TABLET | Freq: Two times a day (BID) | ORAL | 0 refills | Status: AC

## 2023-08-19 NOTE — Progress Notes (Signed)
   Acute Office Visit  Subjective:    [name], female    DOB: [birth year], 78 y.o.   MRN: [id number]   HPI 78 y.o. presents today for cloudy urine, pelvic pressure, frequency, dysuria, back pain x 2 days. Denies vaginal symptoms. Thora Lance for OAB.   No LMP recorded. Patient has had a hysterectomy.    Review of Systems  Constitutional:  Negative for chills and fever.  Genitourinary:  Positive for dysuria, frequency, pelvic pain and urgency. Negative for difficulty urinating, flank pain, hematuria, vaginal discharge and vaginal pain.       Objective:    Physical Exam Constitutional:      Appearance: Normal appearance.  Genitourinary:    General: Normal vulva.     Vagina: Vaginal discharge present. No erythema.     BP 116/74 (BP Location: Left Arm, Patient Position: Sitting)   Pulse 73   Ht 5\' 3"  (1.6 m)   Wt 151 lb (68.5 kg)   SpO2 98%   BMI 26.75 kg/m  Wt Readings from Last 3 Encounters:  08/19/23 151 lb (68.5 kg)  05/20/23 150 lb (68 kg)  04/19/23 151 lb (68.5 kg)        Patient informed chaperone available to be present for breast and/or pelvic exam. Patient has requested no chaperone to be present. Patient has been advised what will be completed during breast and pelvic exam.   Wet prep + yeast  UA: 1+ leukocytes, neg nitrites, trace blood, neg protein, yellow/cloudy. Microscopic: wbc 20-40, rbc 0-2, few bacteria, yeast present  Assessment & Plan:   Problem List Items Addressed This Visit   None Visit Diagnoses       Acute cystitis without hematuria    -  Primary   Relevant Medications   sulfamethoxazole-trimethoprim (BACTRIM DS) 800-160 MG tablet     Dysuria       Relevant Orders   Urinalysis,Complete w/RFL Culture     Vaginal candidiasis       Relevant Medications   fluconazole (DIFLUCAN) 150 MG tablet        Vaginal discharge       Relevant Orders   WET PREP FOR TRICH, YEAST, CLUE      Plan: Acute cystitis - Bactrim  800-160 mg BID x 3 days. Culture pending. Wet prep positive for yeast - Diflucan 150 mg every 3 days x 2 doses.   Return if symptoms worsen or fail to improve.    Olivia Mackie DNP, 10:24 AM 08/19/2023

## 2023-08-21 ENCOUNTER — Other Ambulatory Visit: Payer: Self-pay | Admitting: Family Medicine

## 2023-08-21 ENCOUNTER — Encounter: Payer: Self-pay | Admitting: Nurse Practitioner

## 2023-08-21 LAB — URINALYSIS, COMPLETE W/RFL CULTURE
Bilirubin Urine: NEGATIVE
Glucose, UA: NEGATIVE
Hyaline Cast: NONE SEEN /LPF
Ketones, ur: NEGATIVE
Nitrites, Initial: NEGATIVE
Protein, ur: NEGATIVE
Specific Gravity, Urine: 1.003 (ref 1.001–1.035)
pH: 5.5 (ref 5.0–8.0)

## 2023-08-21 LAB — URINE CULTURE
MICRO NUMBER:: 16296824
Result:: NO GROWTH
SPECIMEN QUALITY:: ADEQUATE

## 2023-08-21 LAB — CULTURE INDICATED

## 2023-08-22 DIAGNOSIS — L570 Actinic keratosis: Secondary | ICD-10-CM | POA: Diagnosis not present

## 2023-08-22 DIAGNOSIS — L820 Inflamed seborrheic keratosis: Secondary | ICD-10-CM | POA: Diagnosis not present

## 2023-08-22 DIAGNOSIS — D0439 Carcinoma in situ of skin of other parts of face: Secondary | ICD-10-CM | POA: Diagnosis not present

## 2023-08-22 DIAGNOSIS — B078 Other viral warts: Secondary | ICD-10-CM | POA: Diagnosis not present

## 2023-08-22 DIAGNOSIS — L814 Other melanin hyperpigmentation: Secondary | ICD-10-CM | POA: Diagnosis not present

## 2023-08-22 DIAGNOSIS — D3612 Benign neoplasm of peripheral nerves and autonomic nervous system, upper limb, including shoulder: Secondary | ICD-10-CM | POA: Diagnosis not present

## 2023-08-22 DIAGNOSIS — L821 Other seborrheic keratosis: Secondary | ICD-10-CM | POA: Diagnosis not present

## 2023-08-22 DIAGNOSIS — D485 Neoplasm of uncertain behavior of skin: Secondary | ICD-10-CM | POA: Diagnosis not present

## 2023-08-26 ENCOUNTER — Other Ambulatory Visit: Payer: Self-pay | Admitting: Family Medicine

## 2023-08-26 DIAGNOSIS — M25561 Pain in right knee: Secondary | ICD-10-CM | POA: Diagnosis not present

## 2023-08-26 DIAGNOSIS — M5416 Radiculopathy, lumbar region: Secondary | ICD-10-CM | POA: Diagnosis not present

## 2023-08-26 DIAGNOSIS — Z79891 Long term (current) use of opiate analgesic: Secondary | ICD-10-CM | POA: Diagnosis not present

## 2023-08-26 DIAGNOSIS — M961 Postlaminectomy syndrome, not elsewhere classified: Secondary | ICD-10-CM | POA: Diagnosis not present

## 2023-08-26 DIAGNOSIS — M51362 Other intervertebral disc degeneration, lumbar region with discogenic back pain and lower extremity pain: Secondary | ICD-10-CM | POA: Diagnosis not present

## 2023-09-09 DIAGNOSIS — M7051 Other bursitis of knee, right knee: Secondary | ICD-10-CM | POA: Diagnosis not present

## 2023-09-09 DIAGNOSIS — M1711 Unilateral primary osteoarthritis, right knee: Secondary | ICD-10-CM | POA: Diagnosis not present

## 2023-09-13 DIAGNOSIS — M5416 Radiculopathy, lumbar region: Secondary | ICD-10-CM | POA: Diagnosis not present

## 2023-10-08 ENCOUNTER — Ambulatory Visit (INDEPENDENT_AMBULATORY_CARE_PROVIDER_SITE_OTHER): Admitting: Family Medicine

## 2023-10-08 ENCOUNTER — Encounter: Payer: Self-pay | Admitting: Family Medicine

## 2023-10-08 VITALS — BP 160/68 | HR 67 | Temp 97.7°F | Wt 150.7 lb

## 2023-10-08 DIAGNOSIS — R600 Localized edema: Secondary | ICD-10-CM | POA: Diagnosis not present

## 2023-10-08 DIAGNOSIS — R030 Elevated blood-pressure reading, without diagnosis of hypertension: Secondary | ICD-10-CM

## 2023-10-08 MED ORDER — FUROSEMIDE 20 MG PO TABS: ORAL_TABLET | ORAL | 0 refills | Status: DC

## 2023-10-08 NOTE — Progress Notes (Signed)
 Established Patient Office Visit  Subjective   [name], female    DOB: 02-06-1946  Age: 78 y.o. MRN: [id number]  Chief Complaint  Patient presents with   Edema    Patient complains of hand, foot and leg edema, Patient denies any shortness of breath, x4 days     HPI   [name] has history of GERD, hypothyroidism, peripheral neuropathy, osteopenia, B12 deficiency.  She is seen today with some bilateral leg edema which started over the weekend when she was [place] in the mountains visiting friends.  She was there Friday through Monday.  She did eat out at least once but does not feel like she had excessively high sodium intake although she does recall eating country ham.  She also apparently had some canned tomato soup and grilled cheese sandwich yesterday when she arrived home.  Also recalls having some salted peanuts.  She had little bit of swelling in the hands over the weekend but that seems to have subsided.  She states that her leg and feet swelling have improved some since she returned home.  She takes Lyrica  chronically but no other new medications.  No recent nonsteroidal use.  Denies any dyspnea.  No orthopnea.  She had labs in January with normal renal function and normal kidney function.  She has had some headache which started just earlier today and blood pressures quite elevated here today which is very atypical for her.  She does have home blood pressure cuff but not monitoring blood pressure recently at home.  Her weight is essentially unchanged from last visit here.  Past Medical History:  Diagnosis Date   Abdominal pain, epigastric 08/22/2009   Adenomatous colon polyp    Chronic low back pain    Diverticulosis    Duodenal ulcer    GERD (gastroesophageal reflux disease)    Hemorrhoids    Hiatal hernia    HYPOTHYROIDISM 07/20/2008   Insomnia    Keratosis 09/11/2020   Right Hand   Neuropathy    NSVD (normal spontaneous vaginal delivery)    X2    Past Surgical History:  Procedure Laterality Date   ABDOMINAL HYSTERECTOMY     TVH   BACK SURGERY     RUPTURED DISC   BLEPHAROPLASTY     CHOLECYSTECTOMY     COLONOSCOPY  JAN 2014   NEXT COLONOSCOPY IN 10 YEARS   DILATION AND CURETTAGE OF UTERUS     EYE SURGERY  2008   BILATERAL CATARACT    SHOULDER SURGERY Right 05/20/2020   [facility] Dr Alfredo Ano    reports that she quit smoking about 20 years ago. Her smoking use included cigarettes. She started smoking about 60 years ago. She has a 60 pack-year smoking history. She has never used smokeless tobacco. She reports that she does not drink alcohol and does not use drugs. family history includes Bladder Cancer in her mother; Cancer in her father; Diabetes in her brother and sister; Heart disease in her brother, daughter, and father; Hypertension in her brother and sister. Allergies  Allergen Reactions   Aspirin     REACTION: Hives    Review of Systems  Constitutional:  Negative for malaise/fatigue.  Eyes:  Negative for blurred vision.  Respiratory:  Negative for shortness of breath.   Cardiovascular:  Positive for leg swelling. Negative for chest pain and orthopnea.  Neurological:  Positive for headaches. Negative for dizziness and weakness.      Objective:  BP (!) 160/68 (BP Location: Left Arm, Patient Position: Sitting, Cuff Size: Normal)   Pulse 67   Temp 97.7 F (36.5 C) (Oral)   Wt 150 lb 11.2 oz (68.4 kg)   SpO2 95%   BMI 26.70 kg/m  BP Readings from Last 3 Encounters:  10/08/23 (!) 160/68  08/19/23 116/74  05/20/23 120/60   Wt Readings from Last 3 Encounters:  10/08/23 150 lb 11.2 oz (68.4 kg)  08/19/23 151 lb (68.5 kg)  05/20/23 150 lb (68 kg)      Physical Exam Vitals reviewed.  Constitutional:      General: She is not in acute distress.    Appearance: She is well-developed. She is not ill-appearing.  Eyes:     Pupils: Pupils are equal, round, and reactive to light.  Neck:     Thyroid :  No thyromegaly.     Vascular: No JVD.  Cardiovascular:     Rate and Rhythm: Normal rate and regular rhythm.     Heart sounds:     No gallop.  Pulmonary:     Effort: Pulmonary effort is normal. No respiratory distress.     Breath sounds: Normal breath sounds. No wheezing or rales.  Musculoskeletal:     Cervical back: Neck supple.     Comments: Trace pitting edema lower legs and feet bilaterally.  Neurological:     Mental Status: She is alert.      No results found for any visits on 10/08/23.  Last CBC Lab Results  Component Value Date   WBC 11.1 (H) 05/20/2023   HGB 14.5 05/20/2023   HCT 43.2 05/20/2023   MCV 94.9 05/20/2023   MCH 32.4 12/10/2022   RDW 13.5 05/20/2023   PLT 220.0 05/20/2023   Last metabolic panel Lab Results  Component Value Date   GLUCOSE 88 05/20/2023   NA 141 05/20/2023   K 4.1 05/20/2023   CL 104 05/20/2023   CO2 28 05/20/2023   BUN 10 05/20/2023   CREATININE 0.65 05/20/2023   GFR 83.53 05/20/2023   CALCIUM 9.6 05/20/2023   PROT 7.1 05/20/2023   ALBUMIN 4.4 05/20/2023   LABGLOB 2.8 10/02/2021   BILITOT 0.6 05/20/2023   ALKPHOS 91 05/20/2023   AST 15 05/20/2023   ALT 14 05/20/2023   ANIONGAP 14 12/10/2022   Last thyroid  functions Lab Results  Component Value Date   TSH 2.53 05/20/2023      The ASCVD Risk score (Arnett DK, et al., 2019) failed to calculate for the following reasons:   The 2019 ASCVD risk score is only valid for ages 3 to 62    Assessment & Plan:   #1 bilateral leg edema.  According the patient this has improved some over the past day.  She was recently [place] the mountains at [place] and perhaps had increased sodium intake.  She recalls having canned tomato soup and grilled cheese cheese sandwich just yesterday.  Does not have any visible hand edema today.  She does have some mild bilateral leg edema.  She is on chronic Lyrica  but has been on this for several years.  Lung exam is clear.  No orthopnea or  dyspnea with exertion.  Suspect her edema is largely related to dietary changes over the weekend  -Continue to avoid non-steroidals - Elevate legs frequently - Try to keep daily sodium intake less than 2500 mg - We did write for low-dose furosemide  20 mg to use 1 daily as needed for increased edema but  avoid regular use - Set up follow-up to reassess in 2 to 3 weeks.  If not improved at that time get some follow-up labs  #2 elevated blood pressure without diagnosis of hypertension.  Today's reading was very atypical for her.  Did not improve much after rest.  Keep sodium intake down as above.  Handout on DASH diet given.  Recommend home monitoring closely next couple of weeks and bring her cuff back along with readings for follow-up in 2 to 3 weeks to reassess.  If blood pressure still up at that point consider possible thiazide  Glean Lamy, MD

## 2023-10-08 NOTE — Patient Instructions (Signed)
 Monitor home blood pressure and bring in your cuff along with readings as follow up.  Try to keep down sodium intake.   Set up 3 week follow up

## 2023-10-15 ENCOUNTER — Other Ambulatory Visit: Payer: Self-pay | Admitting: Podiatry

## 2023-10-15 ENCOUNTER — Telehealth: Payer: Self-pay | Admitting: Podiatry

## 2023-10-15 MED ORDER — PREGABALIN 150 MG PO CAPS: 150.0000 mg | ORAL_CAPSULE | Freq: Two times a day (BID) | ORAL | 3 refills | Status: DC

## 2023-10-15 NOTE — Telephone Encounter (Signed)
 Dr Luster Salters on call [facility]  [facility].

## 2023-10-15 NOTE — Progress Notes (Signed)
 PRN neuropathy  Dot Gazella, DPM [facility]  Dr. Dot Gazella, DPM    [address] [place], [place] [address]                Office [phone]  Fax [phone]

## 2023-10-15 NOTE — Telephone Encounter (Signed)
 Patient calling asking for RX refill of Lyrica  she [facility]. Patient has 1 week left.

## 2023-10-18 DIAGNOSIS — D485 Neoplasm of uncertain behavior of skin: Secondary | ICD-10-CM | POA: Diagnosis not present

## 2023-10-18 DIAGNOSIS — D0439 Carcinoma in situ of skin of other parts of face: Secondary | ICD-10-CM | POA: Diagnosis not present

## 2023-10-22 ENCOUNTER — Other Ambulatory Visit: Payer: Self-pay

## 2023-10-22 MED ORDER — ROPINIROLE HCL 0.25 MG PO TABS: ORAL_TABLET | ORAL | 0 refills | Status: DC

## 2023-10-24 ENCOUNTER — Telehealth: Payer: Self-pay

## 2023-10-24 NOTE — Telephone Encounter (Signed)
 Pt denied speaking with Nurse Triage. Pt requests a cream be prescribed to the pharmacy below.  Copied from CRM [id number]. Topic: Clinical - Medication Question >> Oct 24, 2023 12:36 PM Jim Motts C wrote: Reason for CRM: Patient called in stating that she has a poison oak rash and is asking if a cream be prescribed to her. She denied making an appointment or speaking with a nurse but asked if she could get a call back to confirm on if the script was sent or not. Patient contact is [phone].   [id number] - [place], [place] - [address] [place] [address] Phone: [phone] Fax: [phone]

## 2023-10-25 ENCOUNTER — Telehealth: Payer: Self-pay | Admitting: Obstetrics and Gynecology

## 2023-10-25 DIAGNOSIS — E2839 Other primary ovarian failure: Secondary | ICD-10-CM

## 2023-10-25 MED ORDER — TRIAMCINOLONE ACETONIDE 0.1 % EX CREA: TOPICAL_CREAM | CUTANEOUS | 0 refills | Status: AC

## 2023-10-25 NOTE — Addendum Note (Signed)
 Addended by: Aurelio Leer on: 10/25/2023 09:34 AM   Modules accepted: Orders

## 2023-10-25 NOTE — Telephone Encounter (Signed)
Patient informed and rx sent. 

## 2023-10-25 NOTE — Telephone Encounter (Signed)
Needs updated bone scan.

## 2023-11-02 DIAGNOSIS — M25561 Pain in right knee: Secondary | ICD-10-CM | POA: Diagnosis not present

## 2023-11-12 DIAGNOSIS — M1711 Unilateral primary osteoarthritis, right knee: Secondary | ICD-10-CM | POA: Diagnosis not present

## 2023-11-20 DIAGNOSIS — H02052 Trichiasis without entropian right lower eyelid: Secondary | ICD-10-CM | POA: Diagnosis not present

## 2023-11-20 DIAGNOSIS — H04123 Dry eye syndrome of bilateral lacrimal glands: Secondary | ICD-10-CM | POA: Diagnosis not present

## 2023-11-25 ENCOUNTER — Ambulatory Visit (INDEPENDENT_AMBULATORY_CARE_PROVIDER_SITE_OTHER): Admitting: Nurse Practitioner

## 2023-11-25 ENCOUNTER — Encounter: Payer: Self-pay | Admitting: Nurse Practitioner

## 2023-11-25 VITALS — BP 118/76 | HR 68 | Temp 97.8°F

## 2023-11-25 DIAGNOSIS — N3000 Acute cystitis without hematuria: Secondary | ICD-10-CM

## 2023-11-25 DIAGNOSIS — R21 Rash and other nonspecific skin eruption: Secondary | ICD-10-CM | POA: Diagnosis not present

## 2023-11-25 DIAGNOSIS — R300 Dysuria: Secondary | ICD-10-CM | POA: Diagnosis not present

## 2023-11-25 DIAGNOSIS — Z1231 Encounter for screening mammogram for malignant neoplasm of breast: Secondary | ICD-10-CM | POA: Diagnosis not present

## 2023-11-25 LAB — HM MAMMOGRAPHY

## 2023-11-25 MED ORDER — NITROFURANTOIN MONOHYD MACRO 100 MG PO CAPS: 100.0000 mg | ORAL_CAPSULE | Freq: Two times a day (BID) | ORAL | 0 refills | Status: DC

## 2023-11-25 NOTE — Progress Notes (Signed)
   Acute Office Visit  Subjective:    [name], female    DOB: [birth year], 78 y.o.   MRN: [id number]   HPI 78 y.o. presents today for urgency, dysuria, and cloudy urine x 2-3 days. Denies vaginal symptoms. Has red spot on upper left thigh. Felt an itch and when she scratched it felt like something fell off. Worried it is a tick bite and she left a piece in there as this has happened to her before and derm had to remove.   No LMP recorded. Patient has had a hysterectomy.    Review of Systems  Constitutional: Negative.  Negative for chills and fever.  Genitourinary:  Positive for dysuria and urgency. Negative for difficulty urinating, flank pain, frequency, genital sores, hematuria, vaginal discharge and vaginal pain.       Cloudy urine  Skin:  Positive for rash (Left upper thigh).       Objective:    Physical Exam Constitutional:      Appearance: Normal appearance.  Abdominal:     Tenderness: There is no right CVA tenderness or left CVA tenderness.  Skin:    Findings: Rash present.         BP 118/76   Pulse 68   Temp 97.8 F (36.6 C) (Oral)   SpO2 99%  Wt Readings from Last 3 Encounters:  10/08/23 150 lb 11.2 oz (68.4 kg)  08/19/23 151 lb (68.5 kg)  05/20/23 150 lb (68 kg)   UA: 1+ leukocytes, neg nitrites, 1+ protein, trace blood, yellow/cloudy. Microscopic: wbc >60, rbc 0-2, many bacteria     Assessment & Plan:   Problem List Items Addressed This Visit   None Visit Diagnoses       Acute cystitis without hematuria    -  Primary   Relevant Medications   nitrofurantoin , macrocrystal-monohydrate, (MACROBID ) 100 MG capsule     Dysuria       Relevant Orders   Urinalysis,Complete w/RFL Culture     Rash          Plan: Acute cystitis - Nitrofurantoin  100 mg BID x 7 days. Declines Pyridium. Culture pending. Increase water intake. Monitor area on thigh, apply OTC hydrocortisone  BID. If no improvement or rash worsens she will follow up with PCP.    Return if symptoms worsen or fail to improve.    Annabella DELENA Shutter DNP, 11:33 AM 11/25/2023

## 2023-11-27 ENCOUNTER — Ambulatory Visit: Payer: Self-pay | Admitting: Nurse Practitioner

## 2023-11-27 ENCOUNTER — Other Ambulatory Visit: Payer: Self-pay | Admitting: Nurse Practitioner

## 2023-11-27 DIAGNOSIS — N3000 Acute cystitis without hematuria: Secondary | ICD-10-CM

## 2023-11-27 LAB — URINE CULTURE
MICRO NUMBER:: 16694558
SPECIMEN QUALITY:: ADEQUATE

## 2023-11-27 LAB — URINALYSIS, COMPLETE W/RFL CULTURE
Bilirubin Urine: NEGATIVE
Glucose, UA: NEGATIVE
Hyaline Cast: NONE SEEN /LPF
Ketones, ur: NEGATIVE
Nitrites, Initial: NEGATIVE
Specific Gravity, Urine: 1.015 (ref 1.001–1.035)
WBC, UA: >=60 /HPF — AB (ref 0–5)
pH: 6 (ref 5.0–8.0)

## 2023-11-27 LAB — CULTURE INDICATED

## 2023-11-27 MED ORDER — SULFAMETHOXAZOLE-TRIMETHOPRIM 800-160 MG PO TABS: 1.0000 | ORAL_TABLET | Freq: Two times a day (BID) | ORAL | 0 refills | Status: AC

## 2023-12-09 ENCOUNTER — Ambulatory Visit (INDEPENDENT_AMBULATORY_CARE_PROVIDER_SITE_OTHER): Admitting: Obstetrics and Gynecology

## 2023-12-09 ENCOUNTER — Encounter: Payer: Self-pay | Admitting: Obstetrics and Gynecology

## 2023-12-09 VITALS — BP 118/76 | HR 59 | Temp 97.4°F

## 2023-12-09 DIAGNOSIS — N342 Other urethritis: Secondary | ICD-10-CM

## 2023-12-09 DIAGNOSIS — N898 Other specified noninflammatory disorders of vagina: Secondary | ICD-10-CM | POA: Diagnosis not present

## 2023-12-09 DIAGNOSIS — R309 Painful micturition, unspecified: Secondary | ICD-10-CM | POA: Diagnosis not present

## 2023-12-09 DIAGNOSIS — H02052 Trichiasis without entropian right lower eyelid: Secondary | ICD-10-CM | POA: Diagnosis not present

## 2023-12-09 DIAGNOSIS — H04123 Dry eye syndrome of bilateral lacrimal glands: Secondary | ICD-10-CM | POA: Diagnosis not present

## 2023-12-09 DIAGNOSIS — E2839 Other primary ovarian failure: Secondary | ICD-10-CM

## 2023-12-09 MED ORDER — INTRAROSA 6.5 MG VA INST: 1.0000 | VAGINAL_INSERT | Freq: Every evening | VAGINAL | 12 refills | Status: AC | PRN

## 2023-12-09 MED ORDER — CEFTRIAXONE SODIUM 1 G IJ SOLR
1.0000 g | Freq: Once | INTRAMUSCULAR | Status: AC
  Administered 2023-12-09: 1 g via INTRAMUSCULAR

## 2023-12-09 NOTE — Progress Notes (Signed)
   Acute Office Visit  Subjective:    [name], female    DOB: 1945-12-28, 78 y.o.   MRN: [id number]   HPI 78 y.o. presents today for Office Visit (Cloudy urine, pressure with urination, no pain, no burning. /Started Friday, finished all other medications that she was taking from previous UTI. ) .\ H/o UTI treated with macrobid  changed to bactrim  7/14 based on culture sensitivity (klebsiella). Went away and symptoms returned. H/o hysterectomy with bladder repair.   Has regular care with [facility] Is not using vaginal estrogen   No LMP recorded. Patient has had a hysterectomy.    Review of Systems     Objective:    OBGyn Exam  BP 118/76 (BP Location: Left Arm, Patient Position: Sitting)   Pulse (!) 59   Temp (!) 97.4 F (36.3 C) (Oral)   SpO2 97%  Wt Readings from Last 3 Encounters:  10/08/23 150 lb 11.2 oz (68.4 kg)  08/19/23 151 lb (68.5 kg)  05/20/23 150 lb (68 kg)      Add MyChart Message   Add Notifications  Back to Top      Encounters Related to Results  11/27/2023 Results Follow-Up 11/25/2023 Office Visit (Ordering Encounter) Back to Top      Contains abnormal data Urine Culture Order: [id number]  Status: Final result     Next appt: None     Test Result Released: Yes (not seen)   2 Result Notes     View Follow-Up Encounter    Component Ref Range & Units (hover) 2 wk ago  MICRO NUMBER: [id number]  SPECIMEN QUALITY: Adequate  Sample Source URINE  STATUS: FINAL  ISOLATE 1: Klebsiella pneumoniae Abnormal   Comment: Greater than 100,000 CFU/mL of Klebsiella pneumoniae  Resulting Agency [facility]     Susceptibility   Klebsiella pneumoniae    URINE CULTURE, REFLEX    AMOX/CLAVULANIC <=2 Sensitive    AMPICILLIN/SULBACTAM 8 Sensitive    CEFAZOLIN <=4 Not Reportable 1    CEFEPIME <=0.12 Sensitive    CEFTAZIDIME <=1 Sensitive    CEFTRIAXONE  <=0.25 Sensitive    CIPROFLOXACIN <=0.06 Sensitive    GENTAMICIN  <=1 Sensitive    IMIPENEM <=0.25 Sensitive    LEVOFLOXACIN  <=0.12 Sensitive    MEROPENEM <=0.25 Sensitive    NITROFURANTOIN  64 Intermediate    PIP/TAZO <=4 Sensitive    TRIMETH /SULFA  <=20 Sensitive 2                 Jada, CMA was present for exam  SVE: atrophic vaginitis. No mesh or lesions seen. Likely bv or yeast infection. Culture collected. No VB stage 1-2 cystocele   UA2+LE and blood Assessment & Plan:  Atrophic vaginitis: to try intrarosa . Patient to consider estorgen cream as well.  Sample of intrarosa  given. Prescription sent to my script. Instructions on use given. 2. UC sent 3. Rocephin  given. Last UC sensitive to this as well. 4.  Encouraged patient to follow with [facility]  Almarie MARLA Carpen

## 2023-12-09 NOTE — Patient Instructions (Signed)
 Locations you can schedule your bone scan are:  The [facility] bone scan location scheduling line is [phone]  [facility] is [phone]  [facility] radiology [phone]   [facility] [phone]  [facility]: [phone]    Another option if they are behind is [facility] and their number is [phone].  I can send the referral if you want to go there.    Please let me know if you have any trouble scheduling. This is important for management of osteoporosis or osteopenia.   I can sit down with you after the scan to discuss results and treatment.  Dr. Glennon

## 2023-12-09 NOTE — Addendum Note (Signed)
 Addended by: REUBEN KAND E on: 12/09/2023 11:54 AM   Modules accepted: Orders

## 2023-12-10 LAB — SURESWAB® ADVANCED VAGINITIS PLUS,TMA
C. trachomatis RNA, TMA: NOT DETECTED
CANDIDA SPECIES: NOT DETECTED
Candida glabrata: DETECTED — AB
N. gonorrhoeae RNA, TMA: NOT DETECTED
SURESWAB(R) ADV BACTERIAL VAGINOSIS(BV),TMA: NEGATIVE
TRICHOMONAS VAGINALIS (TV),TMA: NOT DETECTED

## 2023-12-11 ENCOUNTER — Ambulatory Visit: Payer: Self-pay | Admitting: Obstetrics and Gynecology

## 2023-12-11 LAB — URINALYSIS, COMPLETE W/RFL CULTURE
Bilirubin Urine: NEGATIVE
Glucose, UA: NEGATIVE
Hyaline Cast: NONE SEEN /LPF
Ketones, ur: NEGATIVE
Nitrites, Initial: NEGATIVE
Protein, ur: NEGATIVE
Specific Gravity, Urine: 1.01 (ref 1.001–1.035)
pH: 6 (ref 5.0–8.0)

## 2023-12-11 LAB — URINE CULTURE
MICRO NUMBER:: 16752978
SPECIMEN QUALITY:: ADEQUATE

## 2023-12-11 LAB — CULTURE INDICATED

## 2023-12-11 MED ORDER — FLUCONAZOLE 150 MG PO TABS: 150.0000 mg | ORAL_TABLET | Freq: Once | ORAL | 0 refills | Status: AC

## 2023-12-25 ENCOUNTER — Other Ambulatory Visit: Payer: Self-pay | Admitting: Family Medicine

## 2023-12-25 DIAGNOSIS — D485 Neoplasm of uncertain behavior of skin: Secondary | ICD-10-CM | POA: Diagnosis not present

## 2023-12-25 DIAGNOSIS — Z01818 Encounter for other preprocedural examination: Secondary | ICD-10-CM | POA: Diagnosis not present

## 2023-12-25 DIAGNOSIS — H0279 Other degenerative disorders of eyelid and periocular area: Secondary | ICD-10-CM | POA: Diagnosis not present

## 2023-12-30 DIAGNOSIS — L820 Inflamed seborrheic keratosis: Secondary | ICD-10-CM | POA: Diagnosis not present

## 2023-12-30 DIAGNOSIS — L700 Acne vulgaris: Secondary | ICD-10-CM | POA: Diagnosis not present

## 2024-01-02 ENCOUNTER — Other Ambulatory Visit: Payer: Self-pay | Admitting: Family Medicine

## 2024-01-06 DIAGNOSIS — D23122 Other benign neoplasm of skin of left lower eyelid, including canthus: Secondary | ICD-10-CM | POA: Diagnosis not present

## 2024-01-06 DIAGNOSIS — H0279 Other degenerative disorders of eyelid and periocular area: Secondary | ICD-10-CM | POA: Diagnosis not present

## 2024-01-06 DIAGNOSIS — D485 Neoplasm of uncertain behavior of skin: Secondary | ICD-10-CM | POA: Diagnosis not present

## 2024-01-14 ENCOUNTER — Telehealth: Payer: Self-pay | Admitting: Lab

## 2024-01-14 NOTE — Telephone Encounter (Signed)
 Patient in need of refill for [facility]  [facility].

## 2024-01-14 NOTE — Telephone Encounter (Signed)
 Needs [facility]

## 2024-01-16 ENCOUNTER — Other Ambulatory Visit: Payer: Self-pay | Admitting: Podiatry

## 2024-01-16 MED ORDER — PREGABALIN 150 MG PO CAPS: 150.0000 mg | ORAL_CAPSULE | Freq: Two times a day (BID) | ORAL | 3 refills | Status: DC

## 2024-01-17 NOTE — Telephone Encounter (Signed)
Patient aware will schedule appointment.

## 2024-01-20 DIAGNOSIS — M170 Bilateral primary osteoarthritis of knee: Secondary | ICD-10-CM | POA: Diagnosis not present

## 2024-01-23 ENCOUNTER — Other Ambulatory Visit: Payer: Self-pay | Admitting: Family Medicine

## 2024-01-27 ENCOUNTER — Ambulatory Visit (INDEPENDENT_AMBULATORY_CARE_PROVIDER_SITE_OTHER): Admitting: Family Medicine

## 2024-01-27 ENCOUNTER — Encounter: Payer: Self-pay | Admitting: Family Medicine

## 2024-01-27 VITALS — BP 128/60 | HR 71 | Temp 98.1°F | Resp 16 | Ht 63.0 in | Wt 153.8 lb

## 2024-01-27 DIAGNOSIS — R252 Cramp and spasm: Secondary | ICD-10-CM | POA: Diagnosis not present

## 2024-01-27 DIAGNOSIS — R600 Localized edema: Secondary | ICD-10-CM

## 2024-01-27 DIAGNOSIS — Z23 Encounter for immunization: Secondary | ICD-10-CM | POA: Diagnosis not present

## 2024-01-27 DIAGNOSIS — M1711 Unilateral primary osteoarthritis, right knee: Secondary | ICD-10-CM

## 2024-01-27 LAB — BASIC METABOLIC PANEL WITH GFR
BUN: 9 mg/dL (ref 6–23)
CO2: 28 meq/L (ref 19–32)
Calcium: 9.3 mg/dL (ref 8.4–10.5)
Chloride: 104 meq/L (ref 96–112)
Creatinine, Ser: 0.85 mg/dL (ref 0.40–1.20)
GFR: 64.69 mL/min (ref >60.00)
Glucose, Bld: 91 mg/dL (ref 70–99)
Potassium: 4 meq/L (ref 3.5–5.1)
Sodium: 141 meq/L (ref 135–145)

## 2024-01-27 LAB — CK: Total CK: 212 U/L — ABNORMAL HIGH (ref 17–177)

## 2024-01-27 MED ORDER — BACLOFEN 10 MG PO TABS: 10.0000 mg | ORAL_TABLET | Freq: Every evening | ORAL | 0 refills | Status: AC | PRN

## 2024-01-27 NOTE — Progress Notes (Signed)
 ACUTE VISIT Chief Complaint  Patient presents with   Leg Pain    Pt c/o leg cramps on both legs, feet. Going on for a week or more.    Knee Pain    Pt c/o knee pain on R side. She states she has arthritis. Saw knee dr last week. Waiting on insurance to approve knee injection. Needs something for the pain.    HPI: Cassidy Norton is a 78 y.o. female, who is here today complaining of *** HPI Discussed the use of AI scribe software for clinical note transcription with the patient, who gave verbal consent to proceed.  History of Present Illness Cassidy Norton is an 78 year old female with past medical history of peripheral neuropathy, OA, GERD, hypothyroidism, and B12 deficiency who presents with worsening leg cramps.  She has been experiencing continuous cramps in both legs and feet for the past week, occurring almost every night and sometimes during the day while sitting. Previously, cramps occurred infrequently, about once every four to five months, but never as severe as the current episodes, which are now disrupting her sleep.  She notes swelling in her ankles, which she associates with her neuropathy. Neuropathy symptoms include numbness, tingling, and burning sensations in both legs and feet, primarily from the knees down. She also experiences knee pain due to arthritis and is awaiting an injection from her orthopedist. She has been taking ibuprofen for pain management, which was started after a previous knee injection.  Her medication regimen includes furosemide , which she takes occasionally, levothyroxine , omeprazole , Lyrica  for neuropathy, Requip , and sucralfate . She has not taken furosemide  in the past two weeks. She mentions a history of normal lab results in January, including magnesium levels, despite previous leg cramps.  She reports a recent decline in her eating habits, stating 'I'm not eating like I know I should eat,' and suspects her potassium might be low. She drinks  plenty of water but has not tried any specific treatments for the cramps at night.  She mentions her balance has been off, leading to a minor incident where she fell against a door, but she has not experienced any significant falls. She uses compression stockings for leg swelling but finds them difficult to wear in the summer.  No chest pain, difficulty breathing, or palpitations.  Lab Results  Component Value Date   NA 141 05/20/2023   CL 104 05/20/2023   K 4.1 05/20/2023   CO2 28 05/20/2023   BUN 10 05/20/2023   CREATININE 0.65 05/20/2023   GFR 83.53 05/20/2023   CALCIUM 9.6 05/20/2023   ALBUMIN 4.4 05/20/2023   GLUCOSE 88 05/20/2023   Lab Results  Component Value Date   TSH 2.53 05/20/2023   Review of Systems  Constitutional:  Positive for activity change. Negative for appetite change, chills and fever.  Respiratory:  Negative for cough and wheezing.   Gastrointestinal:  Negative for abdominal pain, nausea and vomiting.  Genitourinary:  Negative for decreased urine volume, dysuria and hematuria.  Musculoskeletal:  Positive for arthralgias.  Skin:  Negative for rash.  Neurological:  Negative for syncope and weakness.  See other pertinent positives and negatives in HPI.  Current Outpatient Medications on File Prior to Visit  Medication Sig Dispense Refill   cycloSPORINE (RESTASIS) 0.05 % ophthalmic emulsion Place 1 drop into the left eye 2 (two) times daily.     estradiol  (ESTRACE ) 0.5 MG tablet TAKE ONE-HALF (1/2) TABLET EVERY OTHER DAY 25 tablet 3   furosemide  (  LASIX ) 20 MG tablet TAKE ONE TABLET BY MOUTH ONCE DAILY AS NEEDED FOR EDEMA. 30 tablet 0   hydrocortisone  (ANUSOL -HC) 25 MG suppository Place 1 suppository (25 mg total) rectally 2 (two) times daily. 12 suppository 0   ibuprofen (ADVIL) 800 MG tablet Take 800 mg by mouth 3 (three) times daily.     levothyroxine  (SYNTHROID ) 112 MCG tablet TAKE 1 TABLET BY MOUTH EVERY DAY 90 tablet 1   Multiple Vitamins-Minerals  (PRESERVISION AREDS 2 PO) Take 2 tablets by mouth daily in the afternoon.     nitrofurantoin , macrocrystal-monohydrate, (MACROBID ) 100 MG capsule Take 1 capsule (100 mg total) by mouth 2 (two) times daily. 14 capsule 0   omeprazole  (PRILOSEC) 40 MG capsule Take 1 capsule (40 mg total) by mouth 2 (two) times daily. 180 capsule 3   Prasterone  (INTRAROSA ) 6.5 MG INST Place 1 suppository vaginally at bedtime as needed. 30 each 12   pregabalin  (LYRICA ) 150 MG capsule Take 1 capsule (150 mg total) by mouth 2 (two) times daily. 180 capsule 3   Psyllium (METAMUCIL PO) Take by mouth.     rOPINIRole  (REQUIP ) 0.25 MG tablet TAKE 1 TABLET NIGHTLY FOR 2 DAYS THEN INCREASE TO 2 TABLETS NIGHTLY THEREAFTER 180 tablet 0   sucralfate  (CARAFATE ) 1 g tablet Take 1 tablet (1 g total) by mouth 3 (three) times daily between meals. Please keep your November 7th appointment for further refills. Thank you 270 tablet 3   triamcinolone  cream (KENALOG ) 0.1 % Apply to rash 2 times daily as needed 30 g 0   Vibegron (GEMTESA) 75 MG TABS Take 1 tablet by mouth daily.     vitamin B-12 (CYANOCOBALAMIN ) 1000 MCG tablet Take 1,000 mcg by mouth daily.     HYDROcodone -acetaminophen (NORCO) 10-325 MG per tablet Take 0.5 tablets by mouth every 6 (six) hours as needed. (Patient not taking: Reported on 01/27/2024)     No current facility-administered medications on file prior to visit.    Past Medical History:  Diagnosis Date   Abdominal pain, epigastric 08/22/2009   Adenomatous colon polyp    Chronic low back pain    Diverticulosis    Duodenal ulcer    GERD (gastroesophageal reflux disease)    Hemorrhoids    Hiatal hernia    HYPOTHYROIDISM 07/20/2008   Insomnia    Keratosis 09/11/2020   Right Hand   Neuropathy    NSVD (normal spontaneous vaginal delivery)    X2   Allergies  Allergen Reactions   Aspirin     REACTION: Hives    Social History   Socioeconomic History   Marital status: Widowed    Spouse name: Not on  file   Number of children: 1   Years of education: Not on file   Highest education level: Not on file  Occupational History   Occupation: Retired    Associate Professor: RETIRED  Tobacco Use   Smoking status: Former    Current packs/day: 0.00    Average packs/day: 1.5 packs/day for 40.0 years (60.0 ttl pk-yrs)    Types: Cigarettes    Start date: 08/28/1963    Quit date: 08/28/2003    Years since quitting: 20.4   Smokeless tobacco: Never  Vaping Use   Vaping status: Never Used  Substance and Sexual Activity   Alcohol use: No    Alcohol/week: 0.0 standard drinks of alcohol   Drug use: No   Sexual activity: Not Currently    Partners: Male    Birth control/protection: Surgical  Comment: HYSTERECTOMY, older than 16, less than 5  Other Topics Concern   Not on file  Social History Narrative   Lives alone, husband passed away 02/13/2021   R handed   Caffeine: 2 C of coffee in the AM   Social Drivers of Health   Financial Resource Strain: Low Risk  (08/20/2022)   Overall Financial Resource Strain (CARDIA)    Difficulty of Paying Living Expenses: Not hard at all  Food Insecurity: No Food Insecurity (08/20/2022)   Hunger Vital Sign    Worried About Running Out of Food in the Last Year: Never true    Ran Out of Food in the Last Year: Never true  Transportation Needs: No Transportation Needs (08/20/2022)   PRAPARE - Administrator, Civil Service (Medical): No    Lack of Transportation (Non-Medical): No  Physical Activity: Insufficiently Active (08/20/2022)   Exercise Vital Sign    Days of Exercise per Week: 5 days    Minutes of Exercise per Session: 20 min  Stress: No Stress Concern Present (08/20/2022)   [employer] of Occupational Health - Occupational Stress Questionnaire    Feeling of Stress : Not at all  Social Connections: Moderately Integrated (08/20/2022)   Social Connection and Isolation Panel    Frequency of Communication with Friends and Family: More than three times a week     Frequency of Social Gatherings with Friends and Family: More than three times a week    Attends Religious Services: More than 4 times per year    Active Member of [employer] or Organizations: Yes    Attends Banker Meetings: More than 4 times per year    Marital Status: Widowed   Vitals:   01/27/24 1429  BP: 128/60  Pulse: 71  Resp: 16  Temp: 98.1 F (36.7 C)  SpO2: 97%   Body mass index is 27.24 kg/m.  Physical Exam Vitals and nursing note reviewed.  Constitutional:      General: She is not in acute distress.    Appearance: She is well-developed. She is not ill-appearing.  HENT:     Head: Normocephalic and atraumatic.  Eyes:     Conjunctiva/sclera: Conjunctivae normal.  Cardiovascular:     Pulses:          Dorsalis pedis pulses are 2+ on the right side and 2+ on the left side.  Pulmonary:     Effort: Pulmonary effort is normal. No respiratory distress.  Musculoskeletal:     Right knee: No erythema. Decreased range of motion. Tenderness present.     Right lower leg: Tenderness present. 1+ Pitting Edema present.     Left lower leg: Tenderness present. 1+ Pitting Edema present.  Skin:    General: Skin is warm.     Findings: No erythema or rash.  Neurological:     Mental Status: She is alert and oriented to person, place, and time.     Comments: Antalgic gait, not assisted.  Psychiatric:        Mood and Affect: Mood and affect normal.   ASSESSMENT AND PLAN:   Bilateral leg cramps -     Basic metabolic panel with GFR; Future -     CK; Future -     Baclofen ; Take 1 tablet (10 mg total) by mouth at bedtime as needed for muscle spasms.  Dispense: 30 each; Refill: 0  Bilateral lower extremity edema  Influenza vaccine needed -     Flu vaccine HIGH DOSE PF(Fluzone  Trivalent)  Osteoarthritis of right knee, unspecified osteoarthritis type   Most likely related with venous insufficiency. Furosemide  can aggravate her leg cramps, she will recommend avoiding  use. Lower extremity elevation and compression stockings recommended. Continue appropriate skin care.  We discussed possible etiologies. Blood work in 05/2023 due to this problem was otherwise negative. BMP and CK ordered today. Recommend trying OTC magnesium glycinate 200 to 240 mg at bedtime, if this does not help she can try baclofen  10 mg at bedtime.  We discussed son side effects of muscle relaxant and Jegede. Fall precaution discussed. Follow-up with PCP in 3 to 4 weeks. *** Right knee osteoarthritis She is already following with orthopedics and currently on ibuprofen 800 mg 3 times daily. She could add acetaminophen 500 mg. Awaiting for insurance approval of hyaluronic acid injections.***  Return in about 4 weeks (around 02/24/2024) for Leg cramps woith PCP.  Kaisen Ackers G. [place], MD  [facility]. [place] office.

## 2024-01-27 NOTE — Patient Instructions (Addendum)
 A few things to remember from today's visit:  Influenza vaccine needed - Plan: Flu vaccine HIGH DOSE PF(Fluzone Trivalent)  Bilateral leg cramps - Plan: Basic metabolic panel with GFR, CK  Bilateral lower extremity edema  Try Over the counter Magnesium glycinate 200 to 240 mg at bedtime. If this doe snot help you can try Baclofen  10 mg at bedtime. Fall precautions. Avoid fluid pills. Elevation of legs and compression stocking will help with edema.  Continue Ibuprofen for knee pain.  If you need refills for medications you take chronically, please call your pharmacy. Do not use My Chart to request refills or for acute issues that need immediate attention. If you send a my chart message, it may take a few days to be addressed, specially if I am not in the office.  Please be sure medication list is accurate. If a new problem present, please set up appointment sooner than planned today.

## 2024-01-29 ENCOUNTER — Ambulatory Visit: Payer: Self-pay | Admitting: Family Medicine

## 2024-01-29 ENCOUNTER — Encounter: Payer: Self-pay | Admitting: Podiatry

## 2024-01-29 ENCOUNTER — Ambulatory Visit (INDEPENDENT_AMBULATORY_CARE_PROVIDER_SITE_OTHER): Admitting: Podiatry

## 2024-01-29 DIAGNOSIS — G5782 Other specified mononeuropathies of left lower limb: Secondary | ICD-10-CM | POA: Diagnosis not present

## 2024-01-29 MED ORDER — TRIAMCINOLONE ACETONIDE 10 MG/ML IJ SUSP
10.0000 mg | Freq: Once | INTRAMUSCULAR | Status: AC
  Administered 2024-01-29: 10 mg

## 2024-01-29 NOTE — Progress Notes (Signed)
 Patient presents complaint of pain on the plantar aspect of the left foot.  Sharp shooting sensations and pain with walking and standing for a while.  Does not recall any injury to the foot.  Says she does have a lump on the skin at that spot.   Physical exam:  General appearance: Pleasant, and in no acute distress. AOx3.  Vascular: Pedal pulses: DP 2/4 bilaterally, PT 2/4 bilaterally. Moderate edema lower legs bilaterally. Capillary fill time immediate bile laterally.  Neurological: Light touch intact feet bilaterally.  Normal Achilles reflex bilaterally.  Positive Mulder sign third IMS left.  Negative Tinel's sign tarsal tunnel porta pedis left.  Dermatologic:   Porokeratotic verrucous lesion plantar aspect of the foot left however there is no tender weakness with pressure directly on the lesion.  Skin normal temperature bilaterally.  Skin normal color, tone, and texture bilaterally.   Musculoskeletal: Tenderness in the third IMS especially plantarly left.  No tenderness for range of motion the lesser MTPs.   Diagnosis: 1.  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  Plan: -Discussed with her the pain she is having in the foot.  Probably a neuroma in the third IMS left.  Not really having any direct tenderness where the lesion is.  It is above and below this along the course of the nerve root he is having discomfort.  Recommend wearing good comfortable supportive shoes with a soft sole.  Avoid going barefoot or wearing flat soled shoes or high-heeled shoes. -injected 3cc 2:1 mixture 0.5 cc Marcaine :Kenolog 10mg /30ml at neuroma third plantar common digital nerve the left.     Return 2 weeks follow-up injection neuroma left

## 2024-02-03 ENCOUNTER — Telehealth: Payer: Self-pay | Admitting: *Deleted

## 2024-02-03 NOTE — Telephone Encounter (Signed)
 Spoke with patient regarding lab results. Questions answered. Patient voiced understanding.

## 2024-02-03 NOTE — Telephone Encounter (Signed)
 Copied from CRM #[id number]. Topic: Clinical - Lab/Test Results >> Feb 03, 2024  8:27 AM Burnard DEL wrote: Reason for CRM: Patient called in regarding lab results.I relayed  message to her from provider,patient verbalized understanding.

## 2024-02-04 ENCOUNTER — Encounter: Payer: Self-pay | Admitting: Podiatry

## 2024-02-04 ENCOUNTER — Ambulatory Visit (INDEPENDENT_AMBULATORY_CARE_PROVIDER_SITE_OTHER): Admitting: Podiatry

## 2024-02-04 DIAGNOSIS — R2689 Other abnormalities of gait and mobility: Secondary | ICD-10-CM | POA: Diagnosis not present

## 2024-02-04 DIAGNOSIS — D2372 Other benign neoplasm of skin of left lower limb, including hip: Secondary | ICD-10-CM

## 2024-02-04 DIAGNOSIS — R2681 Unsteadiness on feet: Secondary | ICD-10-CM | POA: Diagnosis not present

## 2024-02-04 NOTE — Progress Notes (Signed)
 She presents today and states that after that shot is still really hurting on her here and she points to the plantar aspect of the third metatarsal head of the left foot.  Objective: Vital signs are stable alert and oriented x 3.  Pulses are palpable.  She has a benign skin lesion to the plantar aspect of the third metatarsal of the left foot which demonstrates severe pain on palpation to this area.  She has no pain on palpation to the intermetatarsal space itself.  Assessment: Benign skin lesion plantar aspect third metatarsal left.  Plan: Debridement of benign lesion.  She states that that felt much better.

## 2024-02-06 NOTE — Telephone Encounter (Signed)
 Noted

## 2024-02-19 DIAGNOSIS — M6281 Muscle weakness (generalized): Secondary | ICD-10-CM | POA: Diagnosis not present

## 2024-02-19 DIAGNOSIS — R2689 Other abnormalities of gait and mobility: Secondary | ICD-10-CM | POA: Diagnosis not present

## 2024-03-02 DIAGNOSIS — M1711 Unilateral primary osteoarthritis, right knee: Secondary | ICD-10-CM | POA: Diagnosis not present

## 2024-03-02 DIAGNOSIS — M25461 Effusion, right knee: Secondary | ICD-10-CM | POA: Diagnosis not present

## 2024-03-09 DIAGNOSIS — H02052 Trichiasis without entropian right lower eyelid: Secondary | ICD-10-CM | POA: Diagnosis not present

## 2024-03-09 DIAGNOSIS — M1711 Unilateral primary osteoarthritis, right knee: Secondary | ICD-10-CM | POA: Diagnosis not present

## 2024-03-09 DIAGNOSIS — H353131 Nonexudative age-related macular degeneration, bilateral, early dry stage: Secondary | ICD-10-CM | POA: Diagnosis not present

## 2024-03-09 DIAGNOSIS — H04123 Dry eye syndrome of bilateral lacrimal glands: Secondary | ICD-10-CM | POA: Diagnosis not present

## 2024-03-09 DIAGNOSIS — H02831 Dermatochalasis of right upper eyelid: Secondary | ICD-10-CM | POA: Diagnosis not present

## 2024-03-16 DIAGNOSIS — M1711 Unilateral primary osteoarthritis, right knee: Secondary | ICD-10-CM | POA: Diagnosis not present

## 2024-03-18 ENCOUNTER — Other Ambulatory Visit: Payer: Self-pay | Admitting: Obstetrics and Gynecology

## 2024-03-18 NOTE — Telephone Encounter (Signed)
.  Med refill request: estradiol   Last AEX: 03/25/23 Next AEX: not due for a AEX until 2026 Last MMG (if hormonal med) 11/25/23 Refill authorized: Please Advise?    Does pt need to come in for an OV to talk about meds?

## 2024-03-25 ENCOUNTER — Ambulatory Visit: Payer: Self-pay

## 2024-03-25 NOTE — Telephone Encounter (Signed)
 FYI Only or Action Required?: FYI only for provider: appointment scheduled on 11/13.  Patient was last seen in primary care on 01/27/2024 by Jordan, Betty G, MD.  Called Nurse Triage reporting Sore Throat.  Symptoms began today.  Interventions attempted: Nothing.  Symptoms are: gradually worsening.  Triage Disposition: Home Care  Patient/caregiver understands and will follow disposition?: No, wishes to speak with PCP  Patient leaving on a trip on Monday and wanted to be evaluated prior to leaving. Appointment scheduled for 11/13.     Copied from CRM [id number]. Topic: Clinical - Red Word Triage >> Mar 25, 2024  1:08 PM Alfonso HERO wrote: Red Word that prompted transfer to Nurse Triage: head hurt, eye burning, scratchy throat, tightness in chest      Reason for Disposition  [1] Sore throat with cough/cold symptoms AND [2] present < 5 days  Answer Assessment - Initial Assessment Questions Patient leaving on a trip and requesting an appointment for evaluation and treatment. Appointment scheduled for 11/13    1. ONSET: When did the throat start hurting? (Hours or days ago)      This morning  2. SEVERITY: How bad is the sore throat? (Scale 1-10; mild, moderate or severe)     Moderate  3. STREP EXPOSURE: Has there been any exposure to strep within the past week? If Yes, ask: What type of contact occurred?      No 4.  VIRAL SYMPTOMS: Are there any symptoms of a cold, such as a runny nose, cough, hoarse voice or red eyes?      Hoarse voice  5. FEVER: Do you have a fever? If Yes, ask: What is your temperature, how was it measured, and when did it start?     No 6. PUS ON THE TONSILS: Is there pus on the tonsils in the back of your throat?     No 7. OTHER SYMPTOMS: Do you have any other symptoms? (e.g., difficulty breathing, headache, rash)     Headache, abdominal pain, eyes burning  Protocols used: Sore Throat-A-AH

## 2024-03-26 ENCOUNTER — Encounter: Payer: Self-pay | Admitting: Internal Medicine

## 2024-03-26 ENCOUNTER — Ambulatory Visit (INDEPENDENT_AMBULATORY_CARE_PROVIDER_SITE_OTHER): Admitting: Internal Medicine

## 2024-03-26 VITALS — BP 110/64 | HR 65 | Temp 97.6°F | Wt 148.7 lb

## 2024-03-26 DIAGNOSIS — J020 Streptococcal pharyngitis: Secondary | ICD-10-CM | POA: Diagnosis not present

## 2024-03-26 DIAGNOSIS — J029 Acute pharyngitis, unspecified: Secondary | ICD-10-CM | POA: Diagnosis not present

## 2024-03-26 DIAGNOSIS — J3489 Other specified disorders of nose and nasal sinuses: Secondary | ICD-10-CM | POA: Diagnosis not present

## 2024-03-26 LAB — POCT INFLUENZA A/B
Influenza A, POC: NEGATIVE
Influenza B, POC: NEGATIVE

## 2024-03-26 LAB — POCT RAPID STREP A (OFFICE): Rapid Strep A Screen: POSITIVE — AB

## 2024-03-26 LAB — POC COVID19 BINAXNOW: SARS Coronavirus 2 Ag: NEGATIVE

## 2024-03-26 MED ORDER — AMOXICILLIN-POT CLAVULANATE 875-125 MG PO TABS: 1.0000 | ORAL_TABLET | Freq: Two times a day (BID) | ORAL | 0 refills | Status: AC

## 2024-03-26 NOTE — Progress Notes (Signed)
 Established Patient Office Visit     CC/Reason for Visit: URI symptoms  HPI: Cassidy Norton is a 78 y.o. female who is coming in today for the above mentioned reasons.  For the past 3 days has been having significant sore throat, trouble swallowing, nasal and sinus congestion, body aches.   Past Medical/Surgical History: Past Medical History:  Diagnosis Date   Abdominal pain, epigastric 08/22/2009   Adenomatous colon polyp    Chronic low back pain    Diverticulosis    Duodenal ulcer    GERD (gastroesophageal reflux disease)    Hemorrhoids    Hiatal hernia    HYPOTHYROIDISM 07/20/2008   Insomnia    Keratosis 09/11/2020   Right Hand   Neuropathy    NSVD (normal spontaneous vaginal delivery)    X2    Past Surgical History:  Procedure Laterality Date   ABDOMINAL HYSTERECTOMY     TVH   BACK SURGERY     RUPTURED DISC   BLEPHAROPLASTY     CHOLECYSTECTOMY     COLONOSCOPY  JAN 2014   NEXT COLONOSCOPY IN 10 YEARS   DILATION AND CURETTAGE OF UTERUS     EYE SURGERY  2008   BILATERAL CATARACT    SHOULDER SURGERY Right 05/20/2020   [facility]    Social History:  reports that she quit smoking about 20 years ago. Her smoking use included cigarettes. She started smoking about 60 years ago. She has a 60 pack-year smoking history. She has never used smokeless tobacco. She reports that she does not drink alcohol and does not use drugs.  Allergies: Allergies  Allergen Reactions   Aspirin     REACTION: Hives    Family History:  Family History  Problem Relation Age of Onset   Bladder Cancer Mother        non smoker   Cancer Father        gallblader CA   Heart disease Father    Diabetes Sister    Hypertension Sister    Diabetes Brother    Hypertension Brother    Heart disease Brother    Heart disease Daughter    Stomach cancer Neg Hx    Colon cancer Neg Hx      Current Outpatient Medications:    amoxicillin -clavulanate (AUGMENTIN ) 875-125  MG tablet, Take 1 tablet by mouth 2 (two) times daily for 7 days., Disp: 14 tablet, Rfl: 0   baclofen  (LIORESAL ) 10 MG tablet, Take 1 tablet (10 mg total) by mouth at bedtime as needed for muscle spasms., Disp: 30 each, Rfl: 0   cycloSPORINE (RESTASIS) 0.05 % ophthalmic emulsion, Place 1 drop into the left eye 2 (two) times daily., Disp: , Rfl:    estradiol  (ESTRACE ) 0.5 MG tablet, TAKE ONE-HALF (1/2) TABLET EVERY OTHER DAY, Disp: 25 tablet, Rfl: 3   famotidine (PEPCID) 20 MG tablet, TAKE 1 TABLET TWICE A DAY BY ORAL ROUTE FOR 30 DAYS, FOR GI PROTECTION., Disp: , Rfl:    furosemide  (LASIX ) 20 MG tablet, TAKE ONE TABLET BY MOUTH ONCE DAILY AS NEEDED FOR EDEMA., Disp: 30 tablet, Rfl: 0   hydrocortisone  (ANUSOL -HC) 25 MG suppository, Place 1 suppository (25 mg total) rectally 2 (two) times daily., Disp: 12 suppository, Rfl: 0   ibuprofen (ADVIL) 800 MG tablet, Take 800 mg by mouth 3 (three) times daily., Disp: , Rfl:    indomethacin (INDOCIN) 50 MG capsule, TAKE 1 CAPSULE 3 TIMES A DAY BY MOUTH WITH MEALS FOR  3 DAYS, FOR RIGHT KNEE INFLAMMATION., Disp: , Rfl:    levothyroxine  (SYNTHROID ) 112 MCG tablet, TAKE 1 TABLET BY MOUTH EVERY DAY, Disp: 90 tablet, Rfl: 1   Multiple Vitamins-Minerals (PRESERVISION AREDS 2 PO), Take 2 tablets by mouth daily in the afternoon., Disp: , Rfl:    nitrofurantoin , macrocrystal-monohydrate, (MACROBID ) 100 MG capsule, Take 1 capsule (100 mg total) by mouth 2 (two) times daily., Disp: 14 capsule, Rfl: 0   omeprazole  (PRILOSEC) 40 MG capsule, Take 1 capsule (40 mg total) by mouth 2 (two) times daily., Disp: 180 capsule, Rfl: 3   Prasterone  (INTRAROSA ) 6.5 MG INST, Place 1 suppository vaginally at bedtime as needed., Disp: 30 each, Rfl: 12   pregabalin  (LYRICA ) 150 MG capsule, Take 1 capsule (150 mg total) by mouth 2 (two) times daily., Disp: 180 capsule, Rfl: 3   Psyllium (METAMUCIL PO), Take by mouth., Disp: , Rfl:    rOPINIRole  (REQUIP ) 0.25 MG tablet, TAKE 1 TABLET NIGHTLY  FOR 2 DAYS THEN INCREASE TO 2 TABLETS NIGHTLY THEREAFTER, Disp: 180 tablet, Rfl: 0   sucralfate  (CARAFATE ) 1 g tablet, Take 1 tablet (1 g total) by mouth 3 (three) times daily between meals. Please keep your November 7th appointment for further refills. Thank you, Disp: 270 tablet, Rfl: 3   triamcinolone  cream (KENALOG ) 0.1 %, Apply to rash 2 times daily as needed, Disp: 30 g, Rfl: 0   Vibegron (GEMTESA) 75 MG TABS, Take 1 tablet by mouth daily., Disp: , Rfl:    vitamin B-12 (CYANOCOBALAMIN ) 1000 MCG tablet, Take 1,000 mcg by mouth daily., Disp: , Rfl:   Review of Systems:  Negative unless indicated in HPI.   Physical Exam: Vitals:   03/26/24 1012  BP: 110/64  Pulse: 65  Temp: 97.6 F (36.4 C)  TempSrc: Oral  SpO2: 97%  Weight: 148 lb 11.2 oz (67.4 kg)    Body mass index is 26.34 kg/m.   Physical Exam Vitals reviewed.  Constitutional:      Appearance: Normal appearance.  HENT:     Right Ear: Tympanic membrane, ear canal and external ear normal.     Left Ear: Tympanic membrane, ear canal and external ear normal.     Mouth/Throat:     Mouth: Mucous membranes are moist.     Pharynx: Oropharyngeal exudate and posterior oropharyngeal erythema present.  Eyes:     Conjunctiva/sclera: Conjunctivae normal.     Pupils: Pupils are equal, round, and reactive to light.  Cardiovascular:     Rate and Rhythm: Normal rate and regular rhythm.  Pulmonary:     Effort: Pulmonary effort is normal.     Breath sounds: Normal breath sounds.  Neurological:     Mental Status: She is alert.      Impression and Plan:  Strep pharyngitis -     Amoxicillin -Pot Clavulanate; Take 1 tablet by mouth 2 (two) times daily for 7 days.  Dispense: 14 tablet; Refill: 0  Sore throat -     POC COVID-19 BinaxNow -     POCT Influenza A/B -     POCT rapid strep A  Sinus pressure -     POC COVID-19 BinaxNow -     POCT Influenza A/B -     POCT rapid strep A  -In office flu and COVID test are  negative. - Rapid strep test is positive.  Treat with Augmentin  for 7 days.   Time spent:30 minutes reviewing chart, interviewing and examining patient and formulating plan of care.  Tully Theophilus Andrews, MD [facility]

## 2024-04-01 ENCOUNTER — Other Ambulatory Visit: Payer: Self-pay | Admitting: Family Medicine

## 2024-04-06 DIAGNOSIS — M7062 Trochanteric bursitis, left hip: Secondary | ICD-10-CM | POA: Diagnosis not present

## 2024-04-06 DIAGNOSIS — M545 Low back pain, unspecified: Secondary | ICD-10-CM | POA: Diagnosis not present

## 2024-04-07 DIAGNOSIS — M7072 Other bursitis of hip, left hip: Secondary | ICD-10-CM | POA: Diagnosis not present

## 2024-04-13 ENCOUNTER — Other Ambulatory Visit: Payer: Self-pay | Admitting: Gastroenterology

## 2024-04-16 DIAGNOSIS — M7072 Other bursitis of hip, left hip: Secondary | ICD-10-CM | POA: Diagnosis not present

## 2024-04-17 ENCOUNTER — Other Ambulatory Visit: Payer: Self-pay

## 2024-04-17 MED ORDER — PREGABALIN 150 MG PO CAPS: 150.0000 mg | ORAL_CAPSULE | Freq: Two times a day (BID) | ORAL | 3 refills | Status: DC

## 2024-04-17 NOTE — Telephone Encounter (Signed)
 Patient called and left a message. She needs a refill on Pregablin 150 mg sent to [facility]. I can only print it, can't send it electronically. thanks

## 2024-05-06 ENCOUNTER — Telehealth: Payer: Self-pay | Admitting: Podiatry

## 2024-05-06 NOTE — Telephone Encounter (Signed)
 Mail phar can't give Cassidy Norton an est date of when the Lyrica  will be believed. She will be out tomorrow. Can an Rx be sent to the local phar? [facility] in [place]

## 2024-05-08 ENCOUNTER — Other Ambulatory Visit: Payer: Self-pay | Admitting: Podiatry

## 2024-05-08 ENCOUNTER — Ambulatory Visit: Admitting: Internal Medicine

## 2024-05-08 MED ORDER — PREGABALIN 150 MG PO CAPS: 150.0000 mg | ORAL_CAPSULE | Freq: Two times a day (BID) | ORAL | 3 refills | Status: AC

## 2024-05-11 ENCOUNTER — Ambulatory Visit: Admitting: Family Medicine

## 2024-05-11 ENCOUNTER — Encounter: Payer: Self-pay | Admitting: Family Medicine

## 2024-05-11 ENCOUNTER — Ambulatory Visit: Payer: Self-pay

## 2024-05-11 VITALS — BP 130/58 | HR 82 | Temp 98.3°F | Resp 16 | Ht 63.0 in | Wt 147.0 lb

## 2024-05-11 DIAGNOSIS — R309 Painful micturition, unspecified: Secondary | ICD-10-CM

## 2024-05-11 DIAGNOSIS — R319 Hematuria, unspecified: Secondary | ICD-10-CM

## 2024-05-11 DIAGNOSIS — N390 Urinary tract infection, site not specified: Secondary | ICD-10-CM | POA: Diagnosis not present

## 2024-05-11 LAB — POCT URINALYSIS DIPSTICK
Appearance: ABNORMAL
Bilirubin, UA: NEGATIVE
Blood, UA: 50
Glucose, UA: NEGATIVE
Ketones, UA: NEGATIVE
Nitrite, UA: POSITIVE
Protein, UA: POSITIVE — AB
Spec Grav, UA: 1.015
Urobilinogen, UA: 0.2 U/dL
pH, UA: 5

## 2024-05-11 MED ORDER — SULFAMETHOXAZOLE-TRIMETHOPRIM 800-160 MG PO TABS: 1.0000 | ORAL_TABLET | Freq: Two times a day (BID) | ORAL | 0 refills | Status: AC

## 2024-05-11 NOTE — Patient Instructions (Signed)
 It does appear that you have another urinary tract infection.  I will check a urine culture to make sure we are treating the infection with the appropriate antibiotic and we will let you know if any changes will need to be made.  I did send in the same antibiotic that she took earlier this year, so that should be tolerated well.  Make sure to drink plenty of fluids.  If any worsening symptoms you should be seen but I do not expect that to occur.  Take care!  Urinary Tract Infection, Female A urinary tract infection (UTI) is an infection in your urinary tract. The urinary tract is made up of organs that make, store, and get rid of pee (urine) in your body. These organs include: The kidneys. The ureters. The bladder. The urethra. What are the causes? Most UTIs are caused by germs called bacteria. They may be in or near your genitals. These germs grow and cause swelling in your urinary tract. What increases the risk? You're more likely to get a UTI if: You're a female. The urethra is shorter in females than in males. You have a soft tube called a catheter that drains your pee. You can't control when you pee or poop. You have trouble peeing because of: A kidney stone. A urinary blockage. A nerve condition that affects your bladder. Not getting enough to drink. You're sexually active. You use a birth control inside your vagina, like spermicide. You're pregnant. You have low levels of the hormone estrogen in your body. You're an older adult. You're also more likely to get a UTI if you have other health problems. These may include: Diabetes. A weak immune system. Your immune system is your body's defense system. Sickle cell disease. Injury of the spine. What are the signs or symptoms? Symptoms may include: Needing to pee right away. Peeing small amounts often. Pain or burning when you pee. Blood in your pee. Pee that smells bad or odd. Pain in your belly or lower back. You may  also: Feel confused. This may be the first symptom in older adults. Vomit. Not feel hungry. Feel tired or easily annoyed. Have a fever or chills. How is this diagnosed? A UTI is diagnosed based on your medical history and an exam. You may also have other tests. These may include: Pee tests. Blood tests. Tests for sexually transmitted infections (STIs). If you've had more than one UTI, you may need to have imaging studies done to find out why you keep getting them. How is this treated? A UTI can be treated by: Taking antibiotics or other medicines. Drinking enough fluid to keep your pee pale yellow. In rare cases, a UTI can cause a very bad condition called sepsis. Sepsis may be treated in the hospital. Follow these instructions at home: Medicines Take your medicines only as told by your health care provider. If you were given antibiotics, take them as told by your provider. Do not stop taking them even if you start to feel better. General instructions Make sure you: Pee often and fully. Do not hold your pee for a long time. Wipe from front to back after you pee or poop. Use each tissue only once when you wipe. Pee after you have sex. Do not douche or use sprays or powders in your genital area. Contact a health care provider if: Your symptoms don't get better after 1-2 days of taking antibiotics. Your symptoms go away and then come back. You have a fever or  chills. You vomit or feel like you may vomit. Get help right away if: You have very bad pain in your back or lower belly. You faint. This information is not intended to replace advice given to you by your health care provider. Make sure you discuss any questions you have with your health care provider. Document Revised: 04/10/2023 Document Reviewed: 08/03/2022 Elsevier Patient Education  2025 [employer].

## 2024-05-11 NOTE — Progress Notes (Signed)
 "  Subjective:  [name], female    DOB: [birth year]  Age: 78 y.o. MRN: [id number]  CC:  Chief Complaint  Patient presents with   Acute Visit    Urinary sx. sx started Friday. Pain and pressure. Urine cloudy. Low back pain. Last night legs were cramping. Not sure if it is connected    HPI Cassidy Norton presents for   Acute visit for above, PCP is Dr. Micheal  Dysuria Started 3 days ago - burning with urination - slight, and cloudy.  Worse yesterday - more cloudy, some cramps in legs last night - those are better today. Left low back has been sore at times.  No hematuria, no hx of nephrolithiasis. No n/v. No fever.  Wears compression stockings that prevent swelling - no leg swelling.   Tx: none.   Last UTI few months ago. Macrobid  for UTI on 7/14. Klebsiella on urine Cx. Changed to bactrim  - tolerated without difficulty.  Sister passed 12/7 - had Alzheimer's, but she was caregiver. Has good support system.   History Patient Active Problem List   Diagnosis Date Noted   Lumbar radiculopathy 08/26/2023   Osteoarthritis of knee 06/10/2023   Pain in right knee 01/02/2022   Peripheral neuropathy 03/23/2019   B12 deficiency 02/12/2018   GERD (gastroesophageal reflux disease) 11/06/2017   Osteopenia 09/13/2015   Ovarian cyst, right 08/16/2014   Sore throat 07/27/2013   NSAID long-term use 07/27/2013   Vertigo 07/23/2013   Vaginal atrophy 08/07/2012   Postmenopausal HRT (hormone replacement therapy) 07/24/2012   Hypothyroidism 07/20/2008   Past Medical History:  Diagnosis Date   Abdominal pain, epigastric 08/22/2009   Adenomatous colon polyp    Chronic low back pain    Diverticulosis    Duodenal ulcer    GERD (gastroesophageal reflux disease)    Hemorrhoids    Hiatal hernia    HYPOTHYROIDISM 07/20/2008   Insomnia    Keratosis 09/11/2020   Right Hand   Neuropathy    NSVD (normal spontaneous vaginal delivery)    X2   Past Surgical History:   Procedure Laterality Date   ABDOMINAL HYSTERECTOMY     TVH   BACK SURGERY     RUPTURED DISC   BLEPHAROPLASTY     CHOLECYSTECTOMY     COLONOSCOPY  JAN 2014   NEXT COLONOSCOPY IN 10 YEARS   DILATION AND CURETTAGE OF UTERUS     EYE SURGERY  2008   BILATERAL CATARACT    SHOULDER SURGERY Right 05/20/2020   [facility]   Allergies[1] Prior to Admission medications  Medication Sig Start Date End Date Taking? Authorizing Provider  cycloSPORINE (RESTASIS) 0.05 % ophthalmic emulsion Place 1 drop into the left eye 2 (two) times daily.   Yes [provider]  estradiol  (ESTRACE ) 0.5 MG tablet TAKE ONE-HALF (1/2) TABLET EVERY OTHER DAY 03/18/24  Yes Boswell, Elizabeth K, MD  famotidine (PEPCID) 20 MG tablet TAKE 1 TABLET TWICE A DAY BY ORAL ROUTE FOR 30 DAYS, FOR GI PROTECTION.   Yes [provider]  hydrocortisone  (ANUSOL -HC) 25 MG suppository Place 1 suppository (25 mg total) rectally 2 (two) times daily. 03/25/23  Yes Glennon Almarie POUR, MD  levothyroxine  (SYNTHROID ) 112 MCG tablet TAKE 1 TABLET BY MOUTH EVERY DAY 01/23/24  Yes Burchette, Wolm ORN, MD  Multiple Vitamins-Minerals (PRESERVISION AREDS 2 PO) Take 2 tablets by mouth daily in the afternoon.   Yes [provider]  omeprazole  (PRILOSEC) 40 MG capsule TAKE  1 CAPSULE TWICE A DAY 04/13/24  Yes McMichael, Bayley M, PA-C  pregabalin  (LYRICA ) 150 MG capsule Take 1 capsule (150 mg total) by mouth 2 (two) times daily. 05/08/24  Yes Janit Thresa HERO, DPM  Psyllium (METAMUCIL PO) Take by mouth.   Yes [provider]  rOPINIRole  (REQUIP ) 0.25 MG tablet TAKE 1 TABLET NIGHTLY FOR 2 DAYS THEN INCREASE TO 2 TABLETS NIGHTLY THEREAFTER 04/01/24  Yes Burchette, Wolm ORN, MD  sucralfate  (CARAFATE ) 1 g tablet Take 1 tablet (1 g total) by mouth 3 (three) times daily between meals. Please keep your November 7th appointment for further refills. Thank you 04/19/23  Yes McMichael, Bayley M, PA-C  Vibegron (GEMTESA) 75  MG TABS Take 1 tablet by mouth daily.   Yes [provider]  vitamin B-12 (CYANOCOBALAMIN ) 1000 MCG tablet Take 1,000 mcg by mouth daily.   Yes [provider]  baclofen  (LIORESAL ) 10 MG tablet Take 1 tablet (10 mg total) by mouth at bedtime as needed for muscle spasms. Patient not taking: Reported on 05/11/2024 01/27/24   Jordan, Betty G, MD  furosemide  (LASIX ) 20 MG tablet TAKE ONE TABLET BY MOUTH ONCE DAILY AS NEEDED FOR EDEMA. Patient not taking: Reported on 05/11/2024 12/25/23   Micheal Wolm ORN, MD  ibuprofen (ADVIL) 800 MG tablet Take 800 mg by mouth 3 (three) times daily. Patient not taking: Reported on 05/11/2024 11/12/23   [provider]  indomethacin (INDOCIN) 50 MG capsule TAKE 1 CAPSULE 3 TIMES A DAY BY MOUTH WITH MEALS FOR 3 DAYS, FOR RIGHT KNEE INFLAMMATION. Patient not taking: Reported on 05/11/2024 01/29/24   [provider]  nitrofurantoin , macrocrystal-monohydrate, (MACROBID ) 100 MG capsule Take 1 capsule (100 mg total) by mouth 2 (two) times daily. Patient not taking: Reported on 05/11/2024 11/25/23   Prentiss Riggs A, NP  Prasterone  (INTRAROSA ) 6.5 MG INST Place 1 suppository vaginally at bedtime as needed. Patient not taking: Reported on 05/11/2024 12/09/23   Glennon Almarie POUR, MD  triamcinolone  cream (KENALOG ) 0.1 % Apply to rash 2 times daily as needed Patient not taking: Reported on 05/11/2024 10/25/23   Micheal Wolm ORN, MD   Social History   Socioeconomic History   Marital status: Widowed    Spouse name: Not on file   Number of children: 1   Years of education: Not on file   Highest education level: Not on file  Occupational History   Occupation: Retired    Associate Professor: RETIRED  Tobacco Use   Smoking status: Former    Current packs/day: 0.00    Average packs/day: 1.5 packs/day for 40.0 years (60.0 ttl pk-yrs)    Types: Cigarettes    Start date: 08/28/1963    Quit date: 08/28/2003    Years since quitting: 20.7   Smokeless  tobacco: Never  Vaping Use   Vaping status: Never Used  Substance and Sexual Activity   Alcohol use: No    Alcohol/week: 0.0 standard drinks of alcohol   Drug use: No   Sexual activity: Not Currently    Partners: Male    Birth control/protection: Surgical    Comment: HYSTERECTOMY, older than 16, less than 5  Other Topics Concern   Not on file  Social History Narrative   Lives alone, husband passed away 05/25/20   R handed   Caffeine: 2 C of coffee in the AM   Social Drivers of Health   Tobacco Use: Medium Risk (05/11/2024)   Patient History    Smoking Tobacco Use: Former  Smokeless Tobacco Use: Never    Passive Exposure: Not on file  Financial Resource Strain: Low Risk (08/20/2022)   Overall Financial Resource Strain (CARDIA)    Difficulty of Paying Living Expenses: Not hard at all  Food Insecurity: No Food Insecurity (08/20/2022)   Hunger Vital Sign    Worried About Running Out of Food in the Last Year: Never true    Ran Out of Food in the Last Year: Never true  Transportation Needs: No Transportation Needs (08/20/2022)   PRAPARE - Administrator, Civil Service (Medical): No    Lack of Transportation (Non-Medical): No  Physical Activity: Insufficiently Active (08/20/2022)   Exercise Vital Sign    Days of Exercise per Week: 5 days    Minutes of Exercise per Session: 20 min  Stress: No Stress Concern Present (08/20/2022)   [employer] of Occupational Health - Occupational Stress Questionnaire    Feeling of Stress : Not at all  Social Connections: Moderately Integrated (08/20/2022)   Social Connection and Isolation Panel    Frequency of Communication with Friends and Family: More than three times a week    Frequency of Social Gatherings with Friends and Family: More than three times a week    Attends Religious Services: More than 4 times per year    Active Member of [employer] or Organizations: Yes    Attends Banker Meetings: More than 4 times per year     Marital Status: Widowed  Intimate Partner Violence: Not At Risk (08/20/2022)   Humiliation, Afraid, Rape, and Kick questionnaire    Fear of Current or Ex-Partner: No    Emotionally Abused: No    Physically Abused: No    Sexually Abused: No  Depression (PHQ2-9): Low Risk (12/28/2022)   Depression (PHQ2-9)    PHQ-2 Score: 0  Alcohol Screen: Low Risk (08/20/2022)   Alcohol Screen    Last Alcohol Screening Score (AUDIT): 0  Housing: Low Risk (08/20/2022)   Housing    Last Housing Risk Score: 0  Utilities: Not At Risk (08/20/2022)   AHC Utilities    Threatened with loss of utilities: No  Health Literacy: Not on file    Review of Systems   Objective:   Vitals:   05/11/24 1553  BP: (!) 130/58  Pulse: 82  Resp: 16  Temp: 98.3 F (36.8 C)  TempSrc: Temporal  SpO2: 97%  Weight: 147 lb (66.7 kg)  Height: 5' 3 (1.6 m)     Physical Exam Vitals reviewed.  Constitutional:      General: She is not in acute distress.    Appearance: Normal appearance. She is well-developed. She is not ill-appearing or toxic-appearing.  HENT:     Head: Normocephalic and atraumatic.  Pulmonary:     Effort: Pulmonary effort is normal.  Abdominal:     General: There is no distension.     Palpations: Abdomen is soft.     Tenderness: There is no abdominal tenderness. There is no right CVA tenderness, left CVA tenderness, guarding or rebound.  Musculoskeletal:     Right lower leg: No edema.     Left lower leg: No edema.  Skin:    General: Skin is warm.  Neurological:     Mental Status: She is alert and oriented to person, place, and time.  Psychiatric:        Behavior: Behavior normal.      Results for orders placed or performed in visit on 05/11/24  POCT urinalysis dipstick  Collection Time: 05/11/24  4:07 PM  Result Value Ref Range   Color, UA yellow    Clarity, UA cloudy    Glucose, UA Negative Negative   Bilirubin, UA Negative    Ketones, UA Negative    Spec Grav, UA 1.015 1.010 -  1.025   Blood, UA 50    pH, UA 5.0 5.0 - 8.0   Protein, UA Positive (A) Negative   Urobilinogen, UA 0.2 0.2 or 1.0 E.U./dL   Nitrite, UA Positive    Leukocytes, UA Large (3+) (A) Negative   Appearance abnormal    Odor slight      Assessment & Plan:  RENIAH COTTINGHAM is a 78 y.o. female . Urinary pain - Plan: POCT urinalysis dipstick  Urinary tract infection without hematuria, site unspecified - Plan: Urine Culture, sulfamethoxazole -trimethoprim  (BACTRIM  DS) 800-160 MG tablet  3-day history of dysuria with urinalysis indicating urinary tract infection.  Reassuring exam.  Has tolerated Septra  previously.  Check culture.  Start Cipro, 5-day course, fluids, RTC precautions discussed.  Prior muscle cramps have improved. maintain hydration, follow-up with PCP with RTC precautions given.   Meds ordered this encounter  Medications   sulfamethoxazole -trimethoprim  (BACTRIM  DS) 800-160 MG tablet    Sig: Take 1 tablet by mouth 2 (two) times daily for 5 days.    Dispense:  10 tablet    Refill:  0   Patient Instructions  It does appear that you have another urinary tract infection.  I will check a urine culture to make sure we are treating the infection with the appropriate antibiotic and we will let you know if any changes will need to be made.  I did send in the same antibiotic that she took earlier this year, so that should be tolerated well.  Make sure to drink plenty of fluids.  If any worsening symptoms you should be seen but I do not expect that to occur.  Take care!  Urinary Tract Infection, Female A urinary tract infection (UTI) is an infection in your urinary tract. The urinary tract is made up of organs that make, store, and get rid of pee (urine) in your body. These organs include: The kidneys. The ureters. The bladder. The urethra. What are the causes? Most UTIs are caused by germs called bacteria. They may be in or near your genitals. These germs grow and cause swelling in your  urinary tract. What increases the risk? You're more likely to get a UTI if: You're a female. The urethra is shorter in females than in males. You have a soft tube called a catheter that drains your pee. You can't control when you pee or poop. You have trouble peeing because of: A kidney stone. A urinary blockage. A nerve condition that affects your bladder. Not getting enough to drink. You're sexually active. You use a birth control inside your vagina, like spermicide. You're pregnant. You have low levels of the hormone estrogen in your body. You're an older adult. You're also more likely to get a UTI if you have other health problems. These may include: Diabetes. A weak immune system. Your immune system is your body's defense system. Sickle cell disease. Injury of the spine. What are the signs or symptoms? Symptoms may include: Needing to pee right away. Peeing small amounts often. Pain or burning when you pee. Blood in your pee. Pee that smells bad or odd. Pain in your belly or lower back. You may also: Feel confused. This may be the first symptom  in older adults. Vomit. Not feel hungry. Feel tired or easily annoyed. Have a fever or chills. How is this diagnosed? A UTI is diagnosed based on your medical history and an exam. You may also have other tests. These may include: Pee tests. Blood tests. Tests for sexually transmitted infections (STIs). If you've had more than one UTI, you may need to have imaging studies done to find out why you keep getting them. How is this treated? A UTI can be treated by: Taking antibiotics or other medicines. Drinking enough fluid to keep your pee pale yellow. In rare cases, a UTI can cause a very bad condition called sepsis. Sepsis may be treated in the hospital. Follow these instructions at home: Medicines Take your medicines only as told by your health care provider. If you were given antibiotics, take them as told by your  provider. Do not stop taking them even if you start to feel better. General instructions Make sure you: Pee often and fully. Do not hold your pee for a long time. Wipe from front to back after you pee or poop. Use each tissue only once when you wipe. Pee after you have sex. Do not douche or use sprays or powders in your genital area. Contact a health care provider if: Your symptoms don't get better after 1-2 days of taking antibiotics. Your symptoms go away and then come back. You have a fever or chills. You vomit or feel like you may vomit. Get help right away if: You have very bad pain in your back or lower belly. You faint. This information is not intended to replace advice given to you by your health care provider. Make sure you discuss any questions you have with your health care provider. Document Revised: 04/10/2023 Document Reviewed: 08/03/2022 Elsevier Patient Education  2025 [employer].    Signed,   Reyes Pines, MD [facility], [facility] 05/11/2024 4:37 PM      [1]  Allergies Allergen Reactions   Aspirin     REACTION: Hives   "

## 2024-05-11 NOTE — Telephone Encounter (Signed)
 FYI Only or Action Required?: Action required by provider: request for appointment.  Patient was last seen in primary care on 03/26/2024 by Cassidy Norton, Tully GRADE, MD.  Called Nurse Triage reporting urinary symptoms.  Symptoms began several days ago.  Interventions attempted: Rest, hydration, or home remedies.  Symptoms are: unchanged.Urinary burning, cloudy urine, pressure.  Triage Disposition: See Physician Within 24 Hours  Patient/caregiver understands and will follow disposition?: Yes       Copied from CRM #[id number]. Topic: Clinical - Red Word Triage >> May 11, 2024  7:38 AM Larissa RAMAN wrote: [facility] that prompted transfer to Nurse Triage: burning and pain with urination, abdominal and leg cramps, cloudy urine Reason for Disposition  Urinating more frequently than usual (i.e., frequency) OR new-onset of the feeling of an urgent need to urinate (i.e., urgency)  Answer Assessment - Initial Assessment Questions 1. SYMPTOM: What's the main symptom you're concerned about? (e.g., frequency, incontinence)     Burning, low abdomen, urine cloudy 2. ONSET: When did the    start?     2 days 3. PAIN: Is there any pain? If Yes, ask: How bad is it? (Scale: 1-10; mild, moderate, severe)     pressure 4. CAUSE: What do you think is causing the symptoms?     UTI 5. OTHER SYMPTOMS: Do you have any other symptoms? (e.g., blood in urine, fever, flank pain, pain with urination)     NO 6. PREGNANCY: Is there any chance you are pregnant? When was your last menstrual period?     no  Protocols used: Urinary Symptoms-A-AH

## 2024-05-16 ENCOUNTER — Ambulatory Visit: Payer: Self-pay | Admitting: Family Medicine

## 2024-05-16 DIAGNOSIS — N390 Urinary tract infection, site not specified: Secondary | ICD-10-CM

## 2024-05-16 LAB — URINE CULTURE
MICRO NUMBER:: 17404504
SPECIMEN QUALITY:: ADEQUATE

## 2024-05-24 NOTE — Addendum Note (Signed)
 Addended by: Tavon Corriher R on: 05/24/2024 02:40 PM   Modules accepted: Orders

## 2024-05-24 NOTE — Telephone Encounter (Signed)
 I appreciate her kind words and happy to hear that she had a good visit.  Unfortunately I am closed to new patients at this time.    If she is having persistent dysuria I have placed a lab only order for repeat urinalysis, culture to make sure that prior infection has cleared.  Please try to schedule lab visit in the next day or 2.  Continue follow-up with urology, but would like to check some testing prior to that visit to make sure.

## 2024-05-26 ENCOUNTER — Ambulatory Visit: Admitting: Nurse Practitioner

## 2024-05-26 ENCOUNTER — Encounter: Payer: Self-pay | Admitting: Nurse Practitioner

## 2024-05-26 VITALS — BP 119/66 | HR 71 | Ht 63.0 in | Wt 146.0 lb

## 2024-05-26 DIAGNOSIS — Z860101 Personal history of adenomatous and serrated colon polyps: Secondary | ICD-10-CM

## 2024-05-26 DIAGNOSIS — R1310 Dysphagia, unspecified: Secondary | ICD-10-CM | POA: Diagnosis not present

## 2024-05-26 DIAGNOSIS — Z8719 Personal history of other diseases of the digestive system: Secondary | ICD-10-CM | POA: Diagnosis not present

## 2024-05-26 NOTE — Patient Instructions (Addendum)
 _______________________________________________________  If your blood pressure at your visit was 140/90 or greater, please contact your primary care physician to follow up on this.  _______________________________________________________  If you are age 81 or older, your body mass index should be between 23-30. Your Body mass index is 25.86 kg/m. If this is out of the aforementioned range listed, please consider follow up with your Primary Care Provider.  If you are age 94 or younger, your body mass index should be between 19-25. Your Body mass index is 25.86 kg/m. If this is out of the aformentioned range listed, please consider follow up with your Primary Care Provider.   ________________________________________________________  The [facility] providers would like to encourage you to use MYCHART to communicate with providers for non-urgent requests or questions.  Due to long hold times on the telephone, sending your provider a message by [facility] may be a faster and more efficient way to get a response.  Please allow 48 business hours for a response.  Please remember that this is for non-urgent requests.  _______________________________________________________  [facility] is using a team-based approach to care.  Your team is made up of your doctor and two to three APPS. Our APPS (Nurse Practitioners and Physician Assistants) work with your physician to ensure care continuity for you. They are fully qualified to address your health concerns and develop a treatment plan. They communicate directly with your gastroenterologist to care for you. Seeing the Advanced Practice Practitioners on your physician's team can help you by facilitating care more promptly, often allowing for earlier appointments, access to diagnostic testing, procedures, and other specialty referrals.   Drink 3 sips of tap water before swallowing any pills or food Cut your food into small pieces and chew food  thoroughly  Avoid eating large pieces of meat, bread or rice   You have been scheduled for an endoscopy. Please follow written instructions given to you at your visit today.  If you use inhalers (even only as needed), please bring them with you on the day of your procedure.  If you take any of the following medications, they will need to be adjusted prior to your procedure:   DO NOT TAKE 7 DAYS PRIOR TO TEST- Trulicity (dulaglutide) Ozempic, Wegovy (semaglutide) Mounjaro, Zepbound (tirzepatide) Bydureon Bcise (exanatide extended release)  DO NOT TAKE 1 DAY PRIOR TO YOUR TEST Rybelsus (semaglutide) Adlyxin (lixisenatide) Victoza (liraglutide) Byetta (exanatide) ___________________________________________________________________________  Thank you for trusting me with your gastrointestinal care!   Elida Shawl, CRNP

## 2024-05-26 NOTE — Progress Notes (Signed)
 Noted

## 2024-05-26 NOTE — Progress Notes (Signed)
 "    05/26/2024 Cassidy Norton [id number] Sep 22, 1945   Primary Gastroenterologist: Dr. Abran   Chief Complaint: Difficulty swallowing, indigestion and a lot of stomach noise  History of Present Illness: Cassidy Norton is an 79 year old female with a past medical history of osteoarthritis, hypertension, hypothyroidism, GERD, duodenal ulcer and diverticulosis.  She was last seen in office by Con Blower, PA-C 04/19/2023 for GERD follow-up. At that time, GERD symptoms were well-controlled on Omeprazole  twice daily and Carafate  3 times daily without any further dysphagia.  Colon cancer screening was discussed and the patient did not wish to pursue any further screening colonoscopies unless she develops symptoms.  Her most recent colonoscopy in 2014 was normal.  Colonoscopy in 2008 identified 1 small tubular adenomatous polyp removed from the colon.  No known family history of colorectal cancer.  Discussed the use of AI scribe software for clinical note transcription with the patient, who gave verbal consent to proceed.  History of Present Illness Cassidy Norton is an 79 year old female with hiatal hernia and gastroesophageal reflux disease who presents for evaluation of new-onset dysphagia.  Over the past two to three months, episodes of dysphagia have occurred approximately once a week, characterized by food getting stuck in her throat while eating, particularly with bread or pastry. Some episodes are accompanied by a sensation of choking, and attempts to drink water during these events result in water coming back out. Three to four episodes of significant choking have been reported, which she found frightening, especially when alone. She manages these episodes by calming herself and waiting for the food to pass or by gagging and expelling the food. No dysphagia was present at the time of her last upper endoscopy in July 2022, which showed a normal esophagus, a hiatal hernia and mild  scarring from a prior duodenal ulcer.  A swallow study in July 2023 revealed mild abnormality, but no significant findings.  Indigestion symptoms are longstanding, especially after consuming fried or greasy foods, and tend to worsen at night, sometimes waking her from sleep. Spicy foods also exacerbate symptoms. Upper abdominal pain and burning occur at least once a week, localized to the upper abdomen. Loud rumbling noises from her stomach are noted after eating, even with mild foods such as soup, without associated abdominal pain.  Current medications include omeprazole  40 mg twice daily and sucralfate  three times a day, both taken for several years. She is not currently taking famotidine.  Bowel movements are generally normal, occurring most days, with a lifelong history of constipation but no recent changes. No black or bloody stools. She notes mild swelling in her left ankle and wears compression stockings. Balance difficulties and neuropathy are also present.  Her most recent colonoscopy was 05/2012 which showed diverticulosis and hemorrhoids, no polyps.      Latest Ref Rng & Units 05/20/2023   12:16 PM 12/10/2022    9:56 PM 03/06/2022   11:53 AM  CBC  WBC 4.0 - 10.5 K/uL 11.1  7.3  8.6   Hemoglobin 12.0 - 15.0 g/dL 85.4  86.3  85.3   Hematocrit 36.0 - 46.0 % 43.2  39.5  43.1   Platelets 150.0 - 400.0 K/uL 220.0  184  227.0        Latest Ref Rng & Units 01/27/2024    2:59 PM 05/20/2023   12:16 PM 12/10/2022    9:56 PM  CMP  Glucose 70 - 99 mg/dL 91  88  878   BUN 6 -  23 mg/dL 9  10  9    Creatinine 0.40 - 1.20 mg/dL 9.14  9.34  9.28   Sodium 135 - 145 mEq/L 141  141  141   Potassium 3.5 - 5.1 mEq/L 4.0  4.1  3.6   Chloride 96 - 112 mEq/L 104  104  103   CO2 19 - 32 mEq/L 28  28  24    Calcium 8.4 - 10.5 mg/dL 9.3  9.6  9.1   Total Protein 6.0 - 8.3 g/dL  7.1  6.2   Total Bilirubin 0.2 - 1.2 mg/dL  0.6  0.5   Alkaline Phos 39 - 117 U/L  91  72   AST 0 - 37 U/L  15  18   ALT 0 - 35  U/L  14  17      Barium swallow study 12/05/2021: FINDINGS: Cervical esophagram: No evidence of laryngeal penetration or intra tracheal aspiration. No evidence of stricture or diverticulum. No evidence of cervical esophageal web. Anterior osteophytes at C5-C6 result in mild indentation of the posterior esophagus.   Thoracic esophagram: Thoracic esophagus is normal in course and caliber. There is no obvious mucosal abnormality. There is a moderate size hiatal hernia, similar to prior upper GI exam in April 2011. Minimal gastroesophageal reflux occurred during the exam. There is mild esophageal dysmotility. A 13 mm barium tablet passed without difficulty into the stomach.   IMPRESSION: Mild esophageal dysmotility.  No evidence of esophageal stricture.   Moderate-sized hiatal hernia, similar to prior upper GI exam in April 2011.   Minimal gastroesophageal reflux occurred during the exam.  PAST GI PROCEDURES:  EGD in July 2022 - The esophagus was normal.  - The stomach was normal, save hiatal hernia.  - The examined duodenum was normal save some mild scarring from prior ulcer disease.  - The cardia and gastric fundus were normal on retroflexion.   Colonoscopy January 2014 - Severe diverticulosis - Internal hemorrhoids - Otherwise normal   Colonoscopy 01/2007 - Diverticulosis - 5 mm tubular adenoma in sigmoid colon - Repeat 5 years   Past Medical History:  Diagnosis Date   Abdominal pain, epigastric 08/22/2009   Adenomatous colon polyp    Chronic low back pain    Diverticulosis    Duodenal ulcer    GERD (gastroesophageal reflux disease)    Hemorrhoids    Hiatal hernia    HYPOTHYROIDISM 07/20/2008   Insomnia    Keratosis 09/11/2020   Right Hand   Neuropathy    NSVD (normal spontaneous vaginal delivery)    X2   Past Surgical History:  Procedure Laterality Date   ABDOMINAL HYSTERECTOMY     TVH   BACK SURGERY     RUPTURED DISC   BLEPHAROPLASTY      CHOLECYSTECTOMY     COLONOSCOPY  JAN 2014   NEXT COLONOSCOPY IN 10 YEARS   DILATION AND CURETTAGE OF UTERUS     EYE SURGERY  2008   BILATERAL CATARACT    SHOULDER SURGERY Right 05/20/2020   [facility] Melita   Medications Ordered Prior to Encounter[1] Allergies[2]  Current Medications, Allergies, Past Medical History, Past Surgical History, Family History and Social History were reviewed in [employer] record.  Review of Systems:   Constitutional: Negative for fever, sweats, chills or weight loss.  Respiratory: Negative for shortness of breath.   Cardiovascular: Negative for chest pain, palpitations and leg swelling.  Gastrointestinal: See HPI.  Musculoskeletal: Negative for back pain or muscle aches.  Neurological: Negative for dizziness, headaches or paresthesias.    Physical Exam: BP 119/66   Pulse 71   Ht 5' 3 (1.6 m)   Wt 146 lb (66.2 kg)   BMI 25.86 kg/m   Wt Readings from Last 3 Encounters:  05/26/24 146 lb (66.2 kg)  05/11/24 147 lb (66.7 kg)  03/26/24 148 lb 11.2 oz (67.4 kg)    General: 79 year old female in no acute distress. Head: Normocephalic and atraumatic. Eyes: No scleral icterus. Conjunctiva pink . Ears: Normal auditory acuity. Mouth: Dentition intact. No ulcers or lesions.  Lungs: Clear throughout to auscultation. Heart: Regular rate and rhythm, no murmur. Abdomen: Soft, nontender and nondistended. No masses or hepatomegaly. Normal bowel sounds x 4 quadrants.  Rectal: Deferred. Musculoskeletal: Symmetrical with no gross deformities. Extremities: No edema. Neurological: Alert oriented x 4. No focal deficits.  Psychological: Alert and cooperative. Normal mood and affect  Assessment and Recommendations:  79 year old female with a history of GERD, remote duodenal ulcer, hiatal hernia and mild esophageal dysmotility presents with indigestion and new onset dysphagia for the past 2 to 3 months. She reported having 3 to 4  significant episodes of dysphagia, felt like she was choking when home alone.  On Omeprazole  40 mg twice daily and Sucralfate  3 times daily for several years.  EGD 11/2020 showed a normal esophagus and a hiatal hernia otherwise was unremarkable. - EGD with possible esophageal dilatation benefits and risks discussed including risk with sedation, risk of bleeding, perforation and infection  - Patient instructed to take 3 sips of tap water before swallowing any pills or food, cut food into small pieces and chew food thoroughly.  Avoid eating large pieces of meat, bread or rice. -Patient will contact our office if her symptoms worsen prior to her EGD date - Further GI recommendations to be determined after EGD completed  Remote history of adenomatous colon polyp per colonoscopy 2008.  No polyps per colonoscopy in 2014.  Patient previously declined any further screening colonoscopies.      [1]  Current Outpatient Medications on File Prior to Visit  Medication Sig Dispense Refill   baclofen  (LIORESAL ) 10 MG tablet Take 1 tablet (10 mg total) by mouth at bedtime as needed for muscle spasms. 30 each 0   cycloSPORINE (RESTASIS) 0.05 % ophthalmic emulsion Place 1 drop into the left eye 2 (two) times daily.     estradiol  (ESTRACE ) 0.5 MG tablet TAKE ONE-HALF (1/2) TABLET EVERY OTHER DAY 25 tablet 3   furosemide  (LASIX ) 20 MG tablet TAKE ONE TABLET BY MOUTH ONCE DAILY AS NEEDED FOR EDEMA. 30 tablet 0   hydrocortisone  (ANUSOL -HC) 25 MG suppository Place 1 suppository (25 mg total) rectally 2 (two) times daily. 12 suppository 0   levothyroxine  (SYNTHROID ) 112 MCG tablet TAKE 1 TABLET BY MOUTH EVERY DAY 90 tablet 1   omeprazole  (PRILOSEC) 40 MG capsule TAKE 1 CAPSULE TWICE A DAY 180 capsule 1   pregabalin  (LYRICA ) 150 MG capsule Take 1 capsule (150 mg total) by mouth 2 (two) times daily. 180 capsule 3   Psyllium (METAMUCIL PO) Take by mouth.     rOPINIRole  (REQUIP ) 0.25 MG tablet TAKE 1 TABLET NIGHTLY FOR 2  DAYS THEN INCREASE TO 2 TABLETS NIGHTLY THEREAFTER 180 tablet 1   sucralfate  (CARAFATE ) 1 g tablet Take 1 tablet (1 g total) by mouth 3 (three) times daily between meals. Please keep your November 7th appointment for further refills. Thank you 270 tablet 3   triamcinolone  cream (KENALOG ) 0.1 % Apply  to rash 2 times daily as needed 30 g 0   Vibegron (GEMTESA) 75 MG TABS Take 1 tablet by mouth daily.     vitamin B-12 (CYANOCOBALAMIN ) 1000 MCG tablet Take 1,000 mcg by mouth daily.     famotidine (PEPCID) 20 MG tablet TAKE 1 TABLET TWICE A DAY BY ORAL ROUTE FOR 30 DAYS, FOR GI PROTECTION. (Patient not taking: Reported on 05/26/2024)     ibuprofen (ADVIL) 800 MG tablet Take 800 mg by mouth 3 (three) times daily. (Patient not taking: Reported on 05/26/2024)     indomethacin (INDOCIN) 50 MG capsule TAKE 1 CAPSULE 3 TIMES A DAY BY MOUTH WITH MEALS FOR 3 DAYS, FOR RIGHT KNEE INFLAMMATION. (Patient not taking: Reported on 05/26/2024)     Multiple Vitamins-Minerals (PRESERVISION AREDS 2 PO) Take 2 tablets by mouth daily in the afternoon. (Patient not taking: Reported on 05/26/2024)     Prasterone  (INTRAROSA ) 6.5 MG INST Place 1 suppository vaginally at bedtime as needed. (Patient not taking: Reported on 05/26/2024) 30 each 12   No current facility-administered medications on file prior to visit.  [2]  Allergies Allergen Reactions   Aspirin     REACTION: Hives   "

## 2024-06-09 ENCOUNTER — Telehealth: Payer: Self-pay | Admitting: *Deleted

## 2024-06-09 ENCOUNTER — Encounter: Admitting: Internal Medicine

## 2024-06-09 NOTE — Telephone Encounter (Signed)
 Pt called 06/08/24 to cancel d/t snow and unable to get out of her driveway. She r/s for 06/30/24. Reviewed NPO times with pt over the phone.

## 2024-06-16 ENCOUNTER — Other Ambulatory Visit: Payer: Self-pay

## 2024-06-16 MED ORDER — HYDROCORTISONE ACETATE 25 MG RE SUPP: 25.0000 mg | Freq: Two times a day (BID) | RECTAL | 0 refills | Status: AC

## 2024-06-16 NOTE — Telephone Encounter (Signed)
 Med refill request:   hydrocortisone  (ANUSOL -HC) 25 MG suppository  Start:  03/25/23 Disp:  12 suppositories Refills:  0  Last AEX:  03/25/23 Last OV: 12/09/23 Next AEX:  Not yet scheduled Last MMG (if hormonal med):  N/A Refill authorized? Please Advise.

## 2024-06-30 ENCOUNTER — Encounter: Admitting: Internal Medicine

## 2024-08-24 ENCOUNTER — Ambulatory Visit (HOSPITAL_BASED_OUTPATIENT_CLINIC_OR_DEPARTMENT_OTHER)
